# Patient Record
Sex: Female | Born: 1955 | Race: Black or African American | Hispanic: No | Smoking: Former smoker
Health system: Southern US, Community
[De-identification: ages and names within clinical notes are randomized; demographics above are authoritative.]

## PROBLEM LIST (undated history)

## (undated) DIAGNOSIS — I10 Essential (primary) hypertension: Secondary | ICD-10-CM

## (undated) DIAGNOSIS — I607 Nontraumatic subarachnoid hemorrhage from unspecified intracranial artery: Secondary | ICD-10-CM

## (undated) HISTORY — DX: Nontraumatic subarachnoid hemorrhage from unspecified intracranial artery: I60.7

## (undated) HISTORY — PX: HERNIA REPAIR: SHX51

## (undated) HISTORY — PX: BRAIN SURGERY: SHX531

---

## 1997-02-14 DIAGNOSIS — I607 Nontraumatic subarachnoid hemorrhage from unspecified intracranial artery: Secondary | ICD-10-CM

## 1997-02-14 HISTORY — DX: Nontraumatic subarachnoid hemorrhage from unspecified intracranial artery: I60.7

## 1997-12-25 ENCOUNTER — Encounter: Payer: Self-pay | Admitting: Family Medicine

## 1997-12-25 ENCOUNTER — Ambulatory Visit (HOSPITAL_COMMUNITY): Admission: RE | Admit: 1997-12-25 | Discharge: 1997-12-25 | Payer: Self-pay | Admitting: Family Medicine

## 1998-11-20 ENCOUNTER — Emergency Department (HOSPITAL_COMMUNITY): Admission: EM | Admit: 1998-11-20 | Discharge: 1998-11-20 | Payer: Self-pay | Admitting: Emergency Medicine

## 1998-11-20 ENCOUNTER — Encounter: Payer: Self-pay | Admitting: Emergency Medicine

## 2000-01-31 ENCOUNTER — Emergency Department (HOSPITAL_COMMUNITY): Admission: EM | Admit: 2000-01-31 | Discharge: 2000-01-31 | Payer: Self-pay | Admitting: *Deleted

## 2000-01-31 ENCOUNTER — Encounter: Payer: Self-pay | Admitting: *Deleted

## 2001-02-13 ENCOUNTER — Emergency Department (HOSPITAL_COMMUNITY): Admission: EM | Admit: 2001-02-13 | Discharge: 2001-02-13 | Payer: Self-pay | Admitting: Emergency Medicine

## 2001-03-02 ENCOUNTER — Encounter: Admission: RE | Admit: 2001-03-02 | Discharge: 2001-03-02 | Payer: Self-pay | Admitting: Family Medicine

## 2001-11-08 ENCOUNTER — Encounter: Admission: RE | Admit: 2001-11-08 | Discharge: 2001-11-08 | Payer: Self-pay | Admitting: Family Medicine

## 2001-11-08 ENCOUNTER — Encounter (INDEPENDENT_AMBULATORY_CARE_PROVIDER_SITE_OTHER): Payer: Self-pay | Admitting: *Deleted

## 2002-05-22 ENCOUNTER — Encounter: Admission: RE | Admit: 2002-05-22 | Discharge: 2002-05-22 | Payer: Self-pay | Admitting: Family Medicine

## 2002-11-14 ENCOUNTER — Encounter: Admission: RE | Admit: 2002-11-14 | Discharge: 2002-11-14 | Payer: Self-pay | Admitting: Sports Medicine

## 2003-02-15 ENCOUNTER — Encounter (INDEPENDENT_AMBULATORY_CARE_PROVIDER_SITE_OTHER): Payer: Self-pay | Admitting: *Deleted

## 2003-02-15 LAB — CONVERTED CEMR LAB

## 2003-02-20 ENCOUNTER — Encounter: Admission: RE | Admit: 2003-02-20 | Discharge: 2003-02-20 | Payer: Self-pay | Admitting: Family Medicine

## 2003-04-08 ENCOUNTER — Encounter: Admission: RE | Admit: 2003-04-08 | Discharge: 2003-04-08 | Payer: Self-pay | Admitting: Sports Medicine

## 2003-07-24 ENCOUNTER — Emergency Department (HOSPITAL_COMMUNITY): Admission: EM | Admit: 2003-07-24 | Discharge: 2003-07-25 | Payer: Self-pay | Admitting: Emergency Medicine

## 2003-08-15 ENCOUNTER — Emergency Department (HOSPITAL_COMMUNITY): Admission: EM | Admit: 2003-08-15 | Discharge: 2003-08-16 | Payer: Self-pay | Admitting: Emergency Medicine

## 2004-02-23 ENCOUNTER — Emergency Department (HOSPITAL_COMMUNITY): Admission: EM | Admit: 2004-02-23 | Discharge: 2004-02-23 | Payer: Self-pay | Admitting: Family Medicine

## 2004-02-27 ENCOUNTER — Emergency Department (HOSPITAL_COMMUNITY): Admission: EM | Admit: 2004-02-27 | Discharge: 2004-02-27 | Payer: Self-pay | Admitting: Family Medicine

## 2004-03-10 ENCOUNTER — Ambulatory Visit: Payer: Self-pay | Admitting: Family Medicine

## 2004-03-26 ENCOUNTER — Encounter: Admission: RE | Admit: 2004-03-26 | Discharge: 2004-03-26 | Payer: Self-pay | Admitting: Sports Medicine

## 2004-12-08 ENCOUNTER — Ambulatory Visit: Payer: Self-pay | Admitting: Sports Medicine

## 2004-12-13 ENCOUNTER — Ambulatory Visit: Payer: Self-pay | Admitting: Sports Medicine

## 2005-05-31 ENCOUNTER — Ambulatory Visit: Payer: Self-pay | Admitting: Family Medicine

## 2005-08-23 ENCOUNTER — Encounter: Admission: RE | Admit: 2005-08-23 | Discharge: 2005-08-23 | Payer: Self-pay | Admitting: Family Medicine

## 2005-09-09 ENCOUNTER — Ambulatory Visit: Payer: Self-pay | Admitting: Family Medicine

## 2005-09-09 ENCOUNTER — Ambulatory Visit (HOSPITAL_COMMUNITY): Admission: RE | Admit: 2005-09-09 | Discharge: 2005-09-09 | Payer: Self-pay | Admitting: Family Medicine

## 2006-04-13 DIAGNOSIS — N951 Menopausal and female climacteric states: Secondary | ICD-10-CM

## 2006-04-13 DIAGNOSIS — I10 Essential (primary) hypertension: Secondary | ICD-10-CM | POA: Insufficient documentation

## 2006-04-13 DIAGNOSIS — E669 Obesity, unspecified: Secondary | ICD-10-CM

## 2006-04-14 ENCOUNTER — Encounter (INDEPENDENT_AMBULATORY_CARE_PROVIDER_SITE_OTHER): Payer: Self-pay | Admitting: *Deleted

## 2006-10-11 ENCOUNTER — Ambulatory Visit: Payer: Self-pay | Admitting: Family Medicine

## 2006-10-11 DIAGNOSIS — I609 Nontraumatic subarachnoid hemorrhage, unspecified: Secondary | ICD-10-CM

## 2006-11-14 ENCOUNTER — Ambulatory Visit: Payer: Self-pay | Admitting: Family Medicine

## 2006-11-14 ENCOUNTER — Encounter (INDEPENDENT_AMBULATORY_CARE_PROVIDER_SITE_OTHER): Payer: Self-pay | Admitting: Family Medicine

## 2006-11-14 LAB — CONVERTED CEMR LAB
Albumin: 4.4 g/dL (ref 3.5–5.2)
BUN: 14 mg/dL (ref 6–23)
Calcium: 9.4 mg/dL (ref 8.4–10.5)
Chlamydia, DNA Probe: NEGATIVE
Chloride: 104 meq/L (ref 96–112)
Creatinine, Ser: 1.13 mg/dL (ref 0.40–1.20)
Glucose, Urine, Semiquant: NEGATIVE
HCT: 42.2 % (ref 36.0–46.0)
MCHC: 32 g/dL (ref 30.0–36.0)
Nitrite: NEGATIVE
Potassium: 3.7 meq/L (ref 3.5–5.3)
Protein, U semiquant: NEGATIVE
RDW: 13.8 % (ref 11.5–14.0)
Sodium: 140 meq/L (ref 135–145)
Specific Gravity, Urine: 1.015
Total Bilirubin: 0.5 mg/dL (ref 0.3–1.2)
Urobilinogen, UA: 0.2
VLDL: 19 mg/dL (ref 0–40)

## 2006-11-16 ENCOUNTER — Telehealth (INDEPENDENT_AMBULATORY_CARE_PROVIDER_SITE_OTHER): Payer: Self-pay | Admitting: Family Medicine

## 2006-11-16 ENCOUNTER — Encounter (INDEPENDENT_AMBULATORY_CARE_PROVIDER_SITE_OTHER): Payer: Self-pay | Admitting: Family Medicine

## 2006-11-20 ENCOUNTER — Ambulatory Visit (HOSPITAL_COMMUNITY): Admission: RE | Admit: 2006-11-20 | Discharge: 2006-11-20 | Payer: Self-pay | Admitting: Neurology

## 2006-11-20 ENCOUNTER — Encounter (INDEPENDENT_AMBULATORY_CARE_PROVIDER_SITE_OTHER): Payer: Self-pay | Admitting: Family Medicine

## 2006-12-06 ENCOUNTER — Encounter (INDEPENDENT_AMBULATORY_CARE_PROVIDER_SITE_OTHER): Payer: Self-pay | Admitting: Family Medicine

## 2006-12-08 ENCOUNTER — Encounter (INDEPENDENT_AMBULATORY_CARE_PROVIDER_SITE_OTHER): Payer: Self-pay | Admitting: Family Medicine

## 2006-12-14 ENCOUNTER — Ambulatory Visit: Payer: Self-pay | Admitting: Family Medicine

## 2006-12-14 ENCOUNTER — Encounter: Payer: Self-pay | Admitting: Family Medicine

## 2007-01-10 ENCOUNTER — Encounter: Admission: RE | Admit: 2007-01-10 | Discharge: 2007-01-10 | Payer: Self-pay | Admitting: Sports Medicine

## 2007-01-18 ENCOUNTER — Encounter: Payer: Self-pay | Admitting: *Deleted

## 2007-02-13 ENCOUNTER — Ambulatory Visit: Payer: Self-pay | Admitting: Family Medicine

## 2007-03-22 ENCOUNTER — Ambulatory Visit: Payer: Self-pay | Admitting: Family Medicine

## 2007-06-19 ENCOUNTER — Emergency Department (HOSPITAL_COMMUNITY): Admission: EM | Admit: 2007-06-19 | Discharge: 2007-06-19 | Payer: Self-pay | Admitting: Emergency Medicine

## 2007-06-25 ENCOUNTER — Emergency Department (HOSPITAL_COMMUNITY): Admission: EM | Admit: 2007-06-25 | Discharge: 2007-06-25 | Payer: Self-pay | Admitting: Family Medicine

## 2007-11-30 ENCOUNTER — Telehealth: Payer: Self-pay | Admitting: *Deleted

## 2007-12-28 ENCOUNTER — Encounter: Payer: Self-pay | Admitting: *Deleted

## 2008-01-14 ENCOUNTER — Encounter: Admission: RE | Admit: 2008-01-14 | Discharge: 2008-01-14 | Payer: Self-pay | Admitting: Hospitalist

## 2008-01-14 LAB — CONVERTED CEMR LAB
Pap Smear: NORMAL
Pap Smear: NORMAL

## 2008-01-21 ENCOUNTER — Ambulatory Visit: Payer: Self-pay | Admitting: Family Medicine

## 2008-01-21 ENCOUNTER — Encounter: Payer: Self-pay | Admitting: Family Medicine

## 2008-01-21 LAB — CONVERTED CEMR LAB
Chlamydia, DNA Probe: NEGATIVE
GC Probe Amp, Genital: NEGATIVE
Whiff Test: NEGATIVE

## 2008-01-25 ENCOUNTER — Encounter: Payer: Self-pay | Admitting: Family Medicine

## 2008-02-05 ENCOUNTER — Ambulatory Visit: Payer: Self-pay | Admitting: Gastroenterology

## 2008-02-18 ENCOUNTER — Encounter: Payer: Self-pay | Admitting: Gastroenterology

## 2008-02-18 ENCOUNTER — Ambulatory Visit: Payer: Self-pay | Admitting: Gastroenterology

## 2008-02-20 ENCOUNTER — Encounter: Payer: Self-pay | Admitting: Gastroenterology

## 2008-08-25 ENCOUNTER — Encounter: Payer: Self-pay | Admitting: Family Medicine

## 2009-01-14 ENCOUNTER — Encounter: Admission: RE | Admit: 2009-01-14 | Discharge: 2009-01-14 | Payer: Self-pay | Admitting: Family Medicine

## 2009-04-27 ENCOUNTER — Telehealth: Payer: Self-pay | Admitting: Family Medicine

## 2009-05-13 ENCOUNTER — Ambulatory Visit: Payer: Self-pay | Admitting: Family Medicine

## 2009-05-13 ENCOUNTER — Encounter: Payer: Self-pay | Admitting: Family Medicine

## 2009-05-13 LAB — CONVERTED CEMR LAB: Pap Smear: NEGATIVE

## 2009-05-26 LAB — CONVERTED CEMR LAB
CO2: 23 meq/L (ref 19–32)
Cholesterol: 177 mg/dL (ref 0–200)
Creatinine, Ser: 0.93 mg/dL (ref 0.40–1.20)
HDL: 51 mg/dL (ref >39)
LDL Cholesterol: 109 mg/dL — ABNORMAL HIGH (ref 0–99)
MCHC: 31.9 g/dL (ref 30.0–36.0)
MCV: 86.9 fL (ref 78.0–100.0)
RBC: 4.58 M/uL (ref 3.87–5.11)
RDW: 13.8 % (ref 11.5–15.5)
Sodium: 140 meq/L (ref 135–145)
Triglycerides: 85 mg/dL (ref <150)
VLDL: 17 mg/dL (ref 0–40)
WBC: 4.4 10*3/uL (ref 4.0–10.5)

## 2010-01-15 ENCOUNTER — Encounter: Admission: RE | Admit: 2010-01-15 | Discharge: 2010-01-15 | Payer: Self-pay | Admitting: Family Medicine

## 2010-02-11 ENCOUNTER — Ambulatory Visit: Payer: Self-pay

## 2010-03-16 NOTE — Assessment & Plan Note (Signed)
Summary: cpe,tcb   Vital Signs:  Patient profile:   55 year old female Height:      65 inches Weight:      207 pounds BMI:     34.57 Temp:     98.1 degrees F oral Pulse rate:   67 / minute BP sitting:   136 / 92  (right arm) Cuff size:   regular  Vitals Entered By: Tessie Fass CMA (May 13, 2009 9:41 AM) CC: complete physical Is Patient Diabetic? No Pain Assessment Patient in pain? no        Primary Care Provider:  Milinda Antis MD  CC:  complete physical.  History of Present Illness: No concerns HTN- tolerating medication, no side effects, no chest pain, SOB, no headache, occassional leg swelling after a long day.  Needs PAP smear today, wants STD check Had colonscopy, 1 poly removed Had Mammogram- neg for malignancy NO menstruation  > 10 years    Habits & Providers  Alcohol-Tobacco-Diet     Tobacco Status: quit  Current Medications (verified): 1)  Caltrate 600+d Plus 600-400 Mg-Unit Tabs (Calcium Carbonate-Vit D-Min) .... Take 1 Tablet By Mouth Twice A Day 2)  Cardizem Cd 240 Mg Cp24 (Diltiazem Hcl Coated Beads) .... Take 1 Capsule By Mouth Once A Day 3)  Maxzide 75-50 Mg Tabs (Triamterene-Hctz) .... Take 1 Tablet By Mouth Once A Day 4)  Epipen 0.3 Mg/0.46ml (1:1000) Devi (Epinephrine Hcl (Anaphylaxis)) .... As Needed Anaphylaxis  Allergies (verified): 1)  ! * Shellfish 2)  ! * Nuts 3)  ! * Seafood 4)  ! * Cats  Social History: Reviewed history from 04/13/2006 and no changes required.  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  Review of Systems       Per HPI  Physical Exam  General:  Well-developed,well-nourished,in no acute distress; alert,appropriate and cooperative throughout examination Vital signs noted  Eyes:  EOMI. Perrla. Funduscopic exam benign, without hemorrhages, exudates or papilledema. Vision grossly normal. Mouth:   Oral mucosa and oropharynx without lesions or exudates.   Neck:  , supple Breasts:  No mass, nodules, thickening, tenderness, bulging, retraction, inflamation, nipple discharge or skin changes noted.   Lungs:  Normal respiratory effort, chest expands symmetrically. Lungs are clear to auscultation, no crackles or wheezes. Heart:  Normal rate and regular rhythm. S1 and S2 normal without gallop, murmur, click, rub or other extra sounds. Abdomen:  Bowel sounds positive,abdomen soft and non-tender without masses,  Genitalia:  Normal introitus for age, no external lesions, no vaginal discharge, mucosa pink and moist, no vaginal or cervical lesions, no vaginal atrophy, no friaility or hemorrhage, normal uterus size and position, no adnexal masses or tenderness Extremities:  No edema  pulses 2+ Foot exam- normal inspection, normal sensation hyperpigmentation on soles of feet bilaterally   Impression & Recommendations:  Problem # 1:  HEALTH MAINTENANCE EXAM (ICD-V70.0) PAP smear done, as well as STD check Orders: [facility] - Est  40-64 yrs ([address])  Problem # 2:  HYPERTENSION, BENIGN SYSTEMIC (ICD-401.1) Assessment: Unchanged Continue meds, labs ordered for baseline monitoring Her updated medication list for this problem includes:    Cardizem Cd 240 Mg Cp24 (Diltiazem hcl coated beads) .Marland Kitchen... Take 1 capsule by mouth once a day    Maxzide 75-50 Mg Tabs (Triamterene-hctz) .Marland Kitchen... Take 1 tablet by mouth once a day  Orders: Basic  Met-[facility] [phone]) CBC-[facility] [phone]) Lipid-[facility] [phone]) [facility] - Est  40-64 yrs ([address])  Complete Medication List: 1)  Caltrate 600+d Plus 600-400 Mg-unit Tabs (Calcium carbonate-vit d-min) .... Take 1 tablet by mouth twice a day 2)  Cardizem Cd 240 Mg Cp24 (Diltiazem hcl coated beads) .... Take 1 capsule by mouth once a day 3)  Maxzide 75-50 Mg Tabs (Triamterene-hctz) .... Take 1 tablet by mouth once a day 4)  Epipen 0.3 Mg/0.75ml (1:1000) Devi (Epinephrine hcl  (anaphylaxis)) .... As needed anaphylaxis  Other Orders: Pap Smear-[facility] ([id number]) GC/Chlamydia-[facility] ([id number])  Patient Instructions: 1)  For your blood pressure-continue your walking routine, continue your current medications 2)  I will follow-up with your PAP smear results and other labs today. If everything is normal I will send you a letter via mail. 3)  Follow-up in 6 months Prescriptions: MAXZIDE 75-50 MG TABS (TRIAMTERENE-HCTZ) Take 1 tablet by mouth once a day  #30 x 6   Entered and Authorized by:   Milinda Antis MD   Signed by:   Milinda Antis MD on 05/13/2009   Method used:   Electronically to        [facility] [phone]* (retail)       [address]       [place], [place]  [address]       Ph: [phone]       Fax: [phone]   RxID:   [id number] CALTRATE 600+D PLUS 600-400 MG-UNIT TABS (CALCIUM CARBONATE-VIT D-MIN) Take 1 tablet by mouth twice a day  #60 x 6   Entered and Authorized by:   Milinda Antis MD   Signed by:   Milinda Antis MD on 05/13/2009   Method used:   Electronically to        [facility] [phone]* (retail)       [address]       [place], [place]  [address]       Ph: [phone]       Fax: [phone]   RxID:   [id number] CARDIZEM CD 240 MG CP24 (DILTIAZEM HCL COATED BEADS) Take 1 capsule by mouth once a day  #30 x 6   Entered and Authorized by:   Milinda Antis MD   Signed by:   Milinda Antis MD on 05/13/2009   Method used:   Electronically to        [facility] [phone]* (retail)       [address]       [place], [place]  [address]       Ph: [phone]       Fax: [phone]   RxID:   [id number]

## 2010-03-16 NOTE — Progress Notes (Signed)
Summary: Rx Req  Phone Note Refill Request Call back at Home Phone [phone] Message from:  Patient  Refills Requested: Medication #1:  MAXZIDE 75-50 MG TABS Take 1 tablet by mouth once a day  Medication #2:  CARDIZEM CD 240 MG CP24 Take 1 capsule by mouth once a day PT USES [facility] [address]  Initial call taken by: Clydell Hakim,  April 27, 2009 3:28 PM  Follow-up for Phone Call        Please tell pt she has to come in for a visit. Has not been seen since 2009. Will give a 59month supply only Follow-up by: Milinda Antis MD,  April 27, 2009 3:38 PM    Prescriptions: MAXZIDE 75-50 MG TABS (TRIAMTERENE-HCTZ) Take 1 tablet by mouth once a day  #30 x 0   Entered and Authorized by:   Milinda Antis MD   Signed by:   Milinda Antis MD on 04/27/2009   Method used:   Electronically to        [facility] [phone]* (retail)       [address]       [place], [place]  [address]       Ph: [phone]       Fax: [phone]   RxID:   [id number] CARDIZEM CD 240 MG CP24 (DILTIAZEM HCL COATED BEADS) Take 1 capsule by mouth once a day  #30 x 0   Entered and Authorized by:   Milinda Antis MD   Signed by:   Milinda Antis MD on 04/27/2009   Method used:   Electronically to        [facility] [phone]* (retail)       [address]       [place], [place]  [address]       Ph: [phone]       Fax: [phone]   RxID:   [id number]   Appended Document: Rx Req called pt. pt has appt 05-13-09 with dr.[place] and will keep it.

## 2010-03-18 ENCOUNTER — Encounter: Payer: Self-pay | Admitting: *Deleted

## 2010-06-02 ENCOUNTER — Other Ambulatory Visit: Payer: Self-pay | Admitting: Family Medicine

## 2010-06-03 NOTE — Telephone Encounter (Signed)
Refill request

## 2010-06-03 NOTE — Telephone Encounter (Signed)
Pt needs an appt , not seen in > 1 year

## 2010-06-17 ENCOUNTER — Other Ambulatory Visit: Payer: Self-pay | Admitting: Family Medicine

## 2010-06-17 NOTE — Telephone Encounter (Signed)
Denied refill request for Maxide pt needs an appt first

## 2010-07-23 ENCOUNTER — Other Ambulatory Visit: Payer: Self-pay | Admitting: Family Medicine

## 2010-07-23 MED ORDER — DILTIAZEM HCL ER COATED BEADS 240 MG PO CP24: 240.0000 mg | ORAL_CAPSULE | Freq: Every day | ORAL | Status: DC

## 2010-07-23 NOTE — Telephone Encounter (Signed)
Needs an appt before further refills 

## 2010-08-29 ENCOUNTER — Other Ambulatory Visit: Payer: Self-pay | Admitting: Family Medicine

## 2010-08-29 MED ORDER — TRIAMTERENE-HCTZ 75-50 MG PO TABS: 1.0000 | ORAL_TABLET | Freq: Every day | ORAL | Status: DC

## 2010-08-29 MED ORDER — DILTIAZEM HCL ER COATED BEADS 240 MG PO CP24: 240.0000 mg | ORAL_CAPSULE | Freq: Every day | ORAL | Status: DC

## 2010-09-06 ENCOUNTER — Other Ambulatory Visit: Payer: Self-pay | Admitting: Family Medicine

## 2010-09-06 MED ORDER — DILTIAZEM HCL ER COATED BEADS 240 MG PO CP24: 240.0000 mg | ORAL_CAPSULE | Freq: Every day | ORAL | Status: DC

## 2010-11-25 LAB — CBC
Platelets: 315
RDW: 13.3
WBC: 3.1 — ABNORMAL LOW

## 2010-11-25 LAB — BASIC METABOLIC PANEL
GFR calc Af Amer: >60
GFR calc non Af Amer: 50 — ABNORMAL LOW
Sodium: 141

## 2010-11-25 LAB — APTT: aPTT: 29

## 2010-11-25 LAB — PROTIME-INR
INR: 1
Prothrombin Time: 13.8

## 2010-11-29 ENCOUNTER — Other Ambulatory Visit: Payer: Self-pay | Admitting: Family Medicine

## 2010-11-29 MED ORDER — TRIAMTERENE-HCTZ 75-50 MG PO TABS: 1.0000 | ORAL_TABLET | Freq: Every day | ORAL | Status: DC

## 2010-11-29 MED ORDER — DILTIAZEM HCL ER COATED BEADS 240 MG PO CP24: 240.0000 mg | ORAL_CAPSULE | Freq: Every day | ORAL | Status: DC

## 2010-12-21 ENCOUNTER — Other Ambulatory Visit: Payer: Self-pay | Admitting: Family Medicine

## 2010-12-21 DIAGNOSIS — Z1231 Encounter for screening mammogram for malignant neoplasm of breast: Secondary | ICD-10-CM

## 2010-12-31 ENCOUNTER — Encounter: Payer: Self-pay | Admitting: Family Medicine

## 2010-12-31 ENCOUNTER — Ambulatory Visit (INDEPENDENT_AMBULATORY_CARE_PROVIDER_SITE_OTHER): Payer: BC Managed Care – PPO | Admitting: Family Medicine

## 2010-12-31 VITALS — BP 140/98 | HR 70 | Temp 98.2°F | Ht 65.0 in | Wt 205.0 lb

## 2010-12-31 DIAGNOSIS — I609 Nontraumatic subarachnoid hemorrhage, unspecified: Secondary | ICD-10-CM

## 2010-12-31 DIAGNOSIS — I10 Essential (primary) hypertension: Secondary | ICD-10-CM

## 2010-12-31 DIAGNOSIS — Z23 Encounter for immunization: Secondary | ICD-10-CM

## 2010-12-31 LAB — COMPREHENSIVE METABOLIC PANEL
ALT: 23 U/L (ref 0–35)
AST: 19 U/L (ref 0–37)
Alkaline Phosphatase: 83 U/L (ref 39–117)
CO2: 26 mEq/L (ref 19–32)
Creat: 1.13 mg/dL — ABNORMAL HIGH (ref 0.50–1.10)
Total Bilirubin: 0.3 mg/dL (ref 0.3–1.2)

## 2010-12-31 LAB — LIPID PANEL
Cholesterol: 164 mg/dL (ref 0–200)
LDL Cholesterol: 99 mg/dL (ref 0–99)
Total CHOL/HDL Ratio: 3.3 Ratio
VLDL: 15 mg/dL (ref 0–40)

## 2010-12-31 NOTE — Progress Notes (Signed)
  Subjective:    Patient ID: Shelley Allison, female    DOB: 17-Feb-1955, 55 y.o.   MRN: [id number]  HPI Pt is here for new MD visit and HTN f/u  HTN: Pt does not check BPs on a regular a basis. Pt has been compliant with medications which include maxzide and cardizem. No HA, CP, SOB. No LE swelling or LE cramping. Pt does report high sodium intake. Pt was placed on diltiazem s/p ruptured cerebral aneurysm back in 1999. Pt has been relatively stable since episode.  Review of Systems See HPI    Objective:   Physical Exam Gen: up in chair, NAD HEENT: NCAT, EOMI, TMs clear bilaterally CV: RRR, no murmurs auscultated PULM: CTAB, no wheezes, rales, rhoncii ABD: S/NT/+ bowel sounds  EXT: 2+ peripheral pulses   Assessment & Plan:

## 2010-12-31 NOTE — Patient Instructions (Signed)

## 2011-01-01 ENCOUNTER — Encounter: Payer: Self-pay | Admitting: Family Medicine

## 2011-01-01 NOTE — Assessment & Plan Note (Signed)
Clinically asymptomatic. Will continue with current regimen.

## 2011-01-01 NOTE — Assessment & Plan Note (Addendum)
Otherwise stable. Will continue with current regimen. Will check Cr and K today. Will also check lipid panel. Handout on low salt diet given.

## 2011-01-02 ENCOUNTER — Encounter: Payer: Self-pay | Admitting: Family Medicine

## 2011-01-17 ENCOUNTER — Ambulatory Visit
Admission: RE | Admit: 2011-01-17 | Discharge: 2011-01-17 | Disposition: A | Discharge status: Home / Self Care | Payer: BC Managed Care – PPO | Source: Ambulatory Visit | Attending: Family Medicine | Admitting: Family Medicine

## 2011-01-17 ENCOUNTER — Other Ambulatory Visit: Payer: Self-pay | Admitting: Family Medicine

## 2011-01-17 DIAGNOSIS — Z1231 Encounter for screening mammogram for malignant neoplasm of breast: Secondary | ICD-10-CM

## 2011-01-17 MED ORDER — DILTIAZEM HCL ER COATED BEADS 240 MG PO CP24: 240.0000 mg | ORAL_CAPSULE | Freq: Every day | ORAL | Status: DC

## 2011-01-17 NOTE — Telephone Encounter (Signed)
Refill request

## 2011-01-24 ENCOUNTER — Ambulatory Visit (INDEPENDENT_AMBULATORY_CARE_PROVIDER_SITE_OTHER): Payer: BC Managed Care – PPO | Admitting: Family Medicine

## 2011-01-24 ENCOUNTER — Encounter: Payer: Self-pay | Admitting: Family Medicine

## 2011-01-24 DIAGNOSIS — J111 Influenza due to unidentified influenza virus with other respiratory manifestations: Secondary | ICD-10-CM

## 2011-01-24 NOTE — Progress Notes (Signed)
  Subjective:    Patient ID: Shelley Allison, female    DOB: 05/05/1955, 55 y.o.   MRN: [id number]  HPI flulike symptoms: Patient reports fever-subjective, dizziness, body aches, chills, positive nausea, vomiting x5-6 times over weekend, occasional dizziness, weakness, off and on headache, decreased appetite-since Saturday morning. No known sick contacts. No abdominal pain. No dark stools. No bloody stools. Patient works in Futures trader.   Review of Systems As per above.    Objective:   Physical Exam  Constitutional: She is oriented to person, place, and time. She appears well-developed and well-nourished.  HENT:  Head: Normocephalic and atraumatic.       + mild throat erythema  Eyes: Pupils are equal, round, and reactive to light.  Neck: Normal range of motion. Neck supple.  Cardiovascular: Normal rate, regular rhythm and normal heart sounds.   No murmur heard. Pulmonary/Chest: Effort normal and breath sounds normal. No respiratory distress.  Abdominal: Soft. She exhibits no distension. There is no tenderness.  Musculoskeletal: She exhibits no edema.  Neurological: She is alert and oriented to person, place, and time.  Skin: No rash noted.  Psychiatric: She has a normal mood and affect. Her behavior is normal.          Assessment & Plan:  and

## 2011-02-02 DIAGNOSIS — J111 Influenza due to unidentified influenza virus with other respiratory manifestations: Secondary | ICD-10-CM | POA: Insufficient documentation

## 2011-02-02 NOTE — Assessment & Plan Note (Signed)
Symptoms most consistent with the flu. Symptomatic treatment only at this time. Patient to only return to work after afebrile for greater than 24 hours.

## 2011-04-23 ENCOUNTER — Other Ambulatory Visit: Payer: Self-pay | Admitting: Family Medicine

## 2011-04-24 NOTE — Telephone Encounter (Signed)
Refill request

## 2011-06-14 ENCOUNTER — Other Ambulatory Visit: Payer: Self-pay | Admitting: Family Medicine

## 2011-07-12 ENCOUNTER — Encounter (HOSPITAL_COMMUNITY): Payer: Self-pay

## 2011-07-12 ENCOUNTER — Emergency Department (HOSPITAL_COMMUNITY): Payer: No Typology Code available for payment source

## 2011-07-12 ENCOUNTER — Emergency Department (HOSPITAL_COMMUNITY)
Admission: EM | Admit: 2011-07-12 | Discharge: 2011-07-13 | Disposition: A | Discharge status: Home / Self Care | Payer: No Typology Code available for payment source | Attending: Emergency Medicine | Admitting: Emergency Medicine

## 2011-07-12 DIAGNOSIS — M25579 Pain in unspecified ankle and joints of unspecified foot: Secondary | ICD-10-CM | POA: Insufficient documentation

## 2011-07-12 DIAGNOSIS — M79672 Pain in left foot: Secondary | ICD-10-CM

## 2011-07-12 DIAGNOSIS — M8569 Other cyst of bone, multiple sites: Secondary | ICD-10-CM | POA: Insufficient documentation

## 2011-07-12 DIAGNOSIS — M8560 Other cyst of bone, unspecified site: Secondary | ICD-10-CM

## 2011-07-12 HISTORY — DX: Essential (primary) hypertension: I10

## 2011-07-12 MED ORDER — TRAMADOL HCL 50 MG PO TABS: 50.0000 mg | ORAL_TABLET | Freq: Four times a day (QID) | ORAL | Status: AC | PRN
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
## 2011-07-13 NOTE — ED Provider Notes (Signed)
Medical screening examination/treatment/procedure(s) were performed by non-physician practitioner and as supervising physician I was immediately available for consultation/collaboration.   Yeray Tomas R Kimberlie Csaszar, MD 07/13/11 0852 
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
## 2011-10-26 ENCOUNTER — Other Ambulatory Visit: Payer: Self-pay | Admitting: Family Medicine

## 2011-11-01 ENCOUNTER — Telehealth: Payer: Self-pay | Admitting: *Deleted

## 2011-11-01 NOTE — Telephone Encounter (Signed)
Patient walks in office  now stating she needs refill on BP meds. According to our records   and the bottle she last had refilled she has not been taking medication regularly.  Last Maxizide given April 2013 and last refill on diltiazem  08/13/2011  Advised she will need appointment for refill.  Appointment scheduled tomorrow at 2:30. Consulted with Dr. Denyse Amass also.

## 2011-11-02 ENCOUNTER — Ambulatory Visit (INDEPENDENT_AMBULATORY_CARE_PROVIDER_SITE_OTHER): Payer: BC Managed Care – PPO | Admitting: Family Medicine

## 2011-11-02 ENCOUNTER — Encounter: Payer: Self-pay | Admitting: Family Medicine

## 2011-11-02 VITALS — BP 171/85 | HR 67 | Temp 98.2°F | Ht 65.0 in | Wt 208.9 lb

## 2011-11-02 DIAGNOSIS — Z23 Encounter for immunization: Secondary | ICD-10-CM

## 2011-11-02 DIAGNOSIS — I10 Essential (primary) hypertension: Secondary | ICD-10-CM

## 2011-11-02 DIAGNOSIS — I609 Nontraumatic subarachnoid hemorrhage, unspecified: Secondary | ICD-10-CM

## 2011-11-02 LAB — BASIC METABOLIC PANEL
BUN: 17 mg/dL (ref 6–23)
Potassium: 4.2 mEq/L (ref 3.5–5.3)

## 2011-11-02 MED ORDER — TRIAMTERENE-HCTZ 75-50 MG PO TABS: 1.0000 | ORAL_TABLET | Freq: Every day | ORAL | Status: DC

## 2011-11-02 MED ORDER — DILTIAZEM HCL ER COATED BEADS 240 MG PO CP24: 240.0000 mg | ORAL_CAPSULE | Freq: Every day | ORAL | Status: DC

## 2011-11-02 NOTE — Patient Instructions (Addendum)
You are getting a flu shot (need every year) and a tetanus shot (need every 10 years) today Pick up your blood pressure meds Get your blood pressure checked when taking meds regularly.  Goal is 140/90 or less See [place] the beginning of December for cholesterol check - sooner if your blood pressure is running high.   The biggest thing you could do to improve your health is to lose weight.

## 2011-11-03 NOTE — Assessment & Plan Note (Signed)
Uncertain control.  States well controled when taking meds.

## 2011-11-03 NOTE — Progress Notes (Signed)
  Subjective:    Patient ID: Shelley Allison, female    DOB: 02-Oct-1955, 56 y.o.   MRN: [id number]  HPI  Out of meds for three days.  No complaints.  Up to date on health maint.  Will get flu shot today.      Review of Systems     Objective:   Physical Exam  Lungs clear Cardiac RRR without m or g Abd benign.        Assessment & Plan:

## 2012-01-18 ENCOUNTER — Other Ambulatory Visit: Payer: Self-pay | Admitting: Family Medicine

## 2012-01-18 DIAGNOSIS — Z1231 Encounter for screening mammogram for malignant neoplasm of breast: Secondary | ICD-10-CM

## 2012-02-21 ENCOUNTER — Ambulatory Visit
Admission: RE | Admit: 2012-02-21 | Discharge: 2012-02-21 | Disposition: A | Discharge status: Home / Self Care | Payer: BC Managed Care – PPO | Source: Ambulatory Visit | Attending: Family Medicine | Admitting: Family Medicine

## 2012-02-21 DIAGNOSIS — Z1231 Encounter for screening mammogram for malignant neoplasm of breast: Secondary | ICD-10-CM

## 2012-08-04 ENCOUNTER — Emergency Department (HOSPITAL_COMMUNITY)
Admission: EM | Admit: 2012-08-04 | Discharge: 2012-08-04 | Disposition: A | Discharge status: Home / Self Care | Payer: BC Managed Care – PPO | Attending: Emergency Medicine | Admitting: Emergency Medicine

## 2012-08-04 DIAGNOSIS — L02419 Cutaneous abscess of limb, unspecified: Secondary | ICD-10-CM | POA: Insufficient documentation

## 2012-08-04 DIAGNOSIS — L0390 Cellulitis, unspecified: Secondary | ICD-10-CM

## 2012-08-04 DIAGNOSIS — L03119 Cellulitis of unspecified part of limb: Secondary | ICD-10-CM | POA: Insufficient documentation

## 2012-08-04 DIAGNOSIS — Z87891 Personal history of nicotine dependence: Secondary | ICD-10-CM | POA: Insufficient documentation

## 2012-08-04 DIAGNOSIS — Z79899 Other long term (current) drug therapy: Secondary | ICD-10-CM | POA: Insufficient documentation

## 2012-08-04 DIAGNOSIS — Z8669 Personal history of other diseases of the nervous system and sense organs: Secondary | ICD-10-CM | POA: Insufficient documentation

## 2012-08-04 DIAGNOSIS — I10 Essential (primary) hypertension: Secondary | ICD-10-CM | POA: Insufficient documentation

## 2012-08-04 MED ORDER — SULFAMETHOXAZOLE-TRIMETHOPRIM 800-160 MG PO TABS: 1.0000 | ORAL_TABLET | Freq: Two times a day (BID) | ORAL | Status: DC

## 2012-08-04 NOTE — ED Notes (Signed)
Pt c/o red, warm swollen area to LLE near ankle. Pt states area started as a knot on Tues and is getting bigger. Pt ambulatory to exam room with steady gait. Pt also has  swelling to her L ankle which also started at the same time.

## 2012-08-04 NOTE — ED Provider Notes (Signed)
History    This chart was scribed for Glade Nurse, non-physician practitioner working with Raeford Razor, MD by Leone Payor, ED Scribe. This patient was seen in room WTR6/WTR6 and the patient's care was started at 2142.   CSN: [id number]  Arrival date & time 08/04/12  2142   First MD Initiated Contact with Patient 08/04/12 2225      Chief Complaint  Patient presents with  . Leg Pain/Swelling      The history is provided by the patient. No language interpreter was used.    HPI Comments: Shelley Allison is a 57 y.o. female with PMHx of HTN who presents to the Emergency Department complaining of gradual onset pain and swelling anterior LLE proximal to the ankle. Pain is mild. Correction to nurse's note: no warmth. Localized.  Onset 4 days ago. States it started as a half-dollar sized knot on 07/31/12 but has gotten bigger since then. She denies any trauma, significant pain, fever, nausea, vomiting. Rates the pain is 3-4/10 without touch and 5-6/10 when being touched.     Past Medical History  Diagnosis Date  . Cerebral aneurysm rupture 1999  . Hypertension     Past Surgical History  Procedure Laterality Date  . Brain surgery    . Cesarean section    . Hernia repair      No family history on file.  History  Substance Use Topics  . Smoking status: Former Games developer  . Smokeless tobacco: Not on file  . Alcohol Use: No    OB History   Grav Para Term Preterm Abortions TAB SAB Ect Mult Living                  Review of Systems  Constitutional: Negative for fever and diaphoresis.  HENT: Negative for neck pain and neck stiffness.   Eyes: Negative for visual disturbance.  Respiratory: Negative for apnea, chest tightness and shortness of breath.   Cardiovascular: Negative for chest pain and palpitations.  Gastrointestinal: Negative for nausea, vomiting, diarrhea and constipation.  Genitourinary: Negative for dysuria.  Musculoskeletal: Negative for joint swelling and gait  problem.  Skin: Positive for color change.       Redness and swelling to left shin  Neurological: Negative for dizziness, weakness, light-headedness, numbness and headaches.    Allergies  Review of patient's allergies indicates no known allergies.  Home Medications   Current Outpatient Rx  Name  Route  Sig  Dispense  Refill  . Calcium Carbonate-Vitamin D (CALTRATE 600+D) 600-400 MG-UNIT per tablet   Oral   Take 1 tablet by mouth 2 (two) times daily.           Marland Kitchen diltiazem (CARDIZEM CD) 240 MG 24 hr capsule   Oral   Take 1 capsule (240 mg total) by mouth daily. Take 1 tablet by mouth daily.   30 capsule   12   . EPINEPHrine (EPIPEN) 0.3 mg/0.3 mL DEVI   Intramuscular   Inject 0.3 mg into the muscle as needed. for anaphylaxis          . triamterene-hydrochlorothiazide (MAXZIDE) 75-50 MG per tablet   Oral   Take 1 tablet by mouth daily.   30 tablet   12     Refill Approved     BP 189/89  Pulse 82  Temp(Src) 99 F (37.2 C) (Oral)  Resp 16  SpO2 100%  Physical Exam  Nursing note and vitals reviewed. Constitutional: She is oriented to person, place, and time. She appears  well-developed and well-nourished. No distress.  HENT:  Head: Normocephalic and atraumatic.  Eyes: Conjunctivae and EOM are normal. Pupils are equal, round, and reactive to light.  Neck: Normal range of motion. Neck supple.  No meningeal signs  Cardiovascular: Normal rate, regular rhythm and normal heart sounds.  Exam reveals no gallop and no friction rub.   No murmur heard. Good cap refill.  Pulmonary/Chest: Effort normal and breath sounds normal. No respiratory distress. She has no wheezes. She has no rales. She exhibits no tenderness.  Abdominal: Soft. Bowel sounds are normal. She exhibits no distension. There is no tenderness. There is no rebound and no guarding.  Musculoskeletal: Normal range of motion. She exhibits tenderness. She exhibits no edema.  5/5 strength throughout. Mild swelling  and erythema to lower left shin. No warmth. No calf tenderness. No palpable cord. Good ROM to ankle, toes, knee.   Neurological: She is alert and oriented to person, place, and time. No cranial nerve deficit.  Skin: Skin is warm and dry. No rash noted. She is not diaphoretic. There is erythema.  5cm erythematous. No warm to touch. Not circumferential. Mild swelling, localized. No red streaks.  Psychiatric: She has a normal mood and affect.    ED Course  Procedures (including critical care time)  DIAGNOSTIC STUDIES: Oxygen Saturation is 100% on RA, normal by my interpretation.    COORDINATION OF CARE: 11:02 PM Discussed treatment plan with pt at bedside and pt agreed to plan.   Labs Reviewed - No data to display No results found.   1. Cellulitis       MDM  Suspect uncomplicated cellulitis based on limited area of involvement, minimal pain, no systemic signs of illness (eg, fever, chills, dehydration, altered mental status, tachypnea, tachycardia, hypotension), no risk factors for serious illness (eg, extremes of age, general debility, immunocompromised status).   PE reveals redness, swelling, mildly tender, not warm to touch. Skin intact, No bleeding. No bullae. Non purulent. Non circumferential.  Borders are not elevated or sharply demarcated.   Drew a line around the area of infection. Pt was instructed to return to the ED if area surpasses the boarder or pain intensifies.   Will prescribed Bactrim to cover for MRSA, direct pt to apply warm compresses and to return to ED for I&D if pain should increase or abscess should develop. Will provide dermatologist contact information and resource.   I personally performed the services described in this documentation, which was scribed in my presence. The recorded information has been reviewed and is accurate.     Glade Nurse, PA-C 08/05/12 1505

## 2012-08-11 NOTE — ED Provider Notes (Signed)
Medical screening examination/treatment/procedure(s) were performed by non-physician practitioner and as supervising physician I was immediately available for consultation/collaboration.  Kindel Rochefort, MD 08/11/12 1646 

## 2012-11-13 ENCOUNTER — Other Ambulatory Visit: Payer: Self-pay | Admitting: Family Medicine

## 2012-11-29 ENCOUNTER — Telehealth: Payer: Self-pay | Admitting: Emergency Medicine

## 2012-11-29 MED ORDER — TRIAMTERENE-HCTZ 75-50 MG PO TABS: 1.0000 | ORAL_TABLET | Freq: Every day | ORAL | Status: DC

## 2012-11-29 MED ORDER — DILTIAZEM HCL ER COATED BEADS 240 MG PO CP24: ORAL_CAPSULE | ORAL | Status: DC

## 2012-11-29 NOTE — Telephone Encounter (Signed)
Pt notified.  Shelley Allison, CMA  

## 2012-11-29 NOTE — Telephone Encounter (Signed)
Will fwd to MD for review.  Shelley Allison L, CMA  

## 2012-11-29 NOTE — Telephone Encounter (Signed)
Daughter calling to request refill for mom's bp medication.  Only have 1 weeks worth of med left.  Scheduled an appt for 11/6 @ 4:00 with provider.  Can fax rx refill to [facility] on [address] [facility].

## 2012-11-29 NOTE — Telephone Encounter (Signed)
1 month supply of diltiazem and maxide sent to [facility].  Will give additional refills at appt on 11/6.

## 2012-12-18 ENCOUNTER — Encounter: Payer: Self-pay | Admitting: Emergency Medicine

## 2012-12-18 ENCOUNTER — Ambulatory Visit (INDEPENDENT_AMBULATORY_CARE_PROVIDER_SITE_OTHER): Payer: BC Managed Care – PPO | Admitting: Emergency Medicine

## 2012-12-18 VITALS — BP 143/86 | HR 65 | Temp 97.8°F | Wt 201.0 lb

## 2012-12-18 DIAGNOSIS — I10 Essential (primary) hypertension: Secondary | ICD-10-CM

## 2012-12-18 MED ORDER — DILTIAZEM HCL ER COATED BEADS 240 MG PO CP24: ORAL_CAPSULE | ORAL | Status: DC

## 2012-12-18 MED ORDER — TRIAMTERENE-HCTZ 75-50 MG PO TABS
1.0000 | ORAL_TABLET | Freq: Every day | ORAL | Status: DC
Start: 2012-12-18 — End: 2013-12-19

## 2012-12-18 NOTE — Patient Instructions (Signed)
It was nice to meet you!  Your blood pressure is pretty good. If it is over 150/90 at home, please come back so we can adjust your medicines.  We are checking some blood work today.  I will call you if anything is wrong, otherwise you will get a letter with the results in the mail in 1-2 weeks.  You are due for a pap smear.  Please make an appointment to do this in the next year.  Follow up in 1 year.

## 2012-12-18 NOTE — Assessment & Plan Note (Signed)
Just above goal today. Will continue diltiazem and maxide at current doses. She will check at home periodically.  If over 150/90, will call the office. BMP today. Follow up in 1 year.

## 2012-12-18 NOTE — Progress Notes (Signed)
  Subjective:    Patient ID: Shelley Allison, female    DOB: 27-May-1955, 57 y.o.   MRN: [id number]  HPI Shelley Allison is here for f/u HTN.  Hypertension Compliant with medication: yes; sometimes forgets to take it with her when she goes out of town. Side effects from medication: no Check BP at home: yes  BP ranges: 140s if she takes her medication  Low salt diet: yes Exercise: no - walks a lot at work  Chest pain: no Palpitations: no Vision changes: no Leg edema: no Dizziness: no  I have reviewed and updated the following as appropriate: allergies, current medications, past family history, past medical history, past social history and past surgical history SHx: former smoker  Review of Systems See HPI    Objective:   Physical Exam        Assessment & Plan:

## 2012-12-19 LAB — BASIC METABOLIC PANEL
Chloride: 107 mEq/L (ref 96–112)
Glucose, Bld: 83 mg/dL (ref 70–99)
Potassium: 4 mEq/L (ref 3.5–5.3)
Sodium: 140 mEq/L (ref 135–145)

## 2012-12-20 ENCOUNTER — Encounter: Payer: Self-pay | Admitting: Emergency Medicine

## 2013-09-24 ENCOUNTER — Encounter: Payer: Self-pay | Admitting: Gastroenterology

## 2013-12-19 ENCOUNTER — Other Ambulatory Visit: Payer: Self-pay | Admitting: *Deleted

## 2013-12-19 DIAGNOSIS — I10 Essential (primary) hypertension: Secondary | ICD-10-CM

## 2013-12-19 MED ORDER — TRIAMTERENE-HCTZ 75-50 MG PO TABS: 1.0000 | ORAL_TABLET | Freq: Every day | ORAL | Status: DC

## 2013-12-19 MED ORDER — DILTIAZEM HCL ER COATED BEADS 240 MG PO CP24: ORAL_CAPSULE | ORAL | Status: DC

## 2013-12-19 NOTE — Telephone Encounter (Signed)
Refills of Cardizem and Maxzide sent to pharmacy.  Patient has not been to clinic in 1 year.  She should be seen for f/u HTN before further refills given.  [name], MD, MPH PGY-1,  [facility] 12/19/2013 2:08 PM

## 2014-01-29 ENCOUNTER — Other Ambulatory Visit: Payer: Self-pay | Admitting: Family Medicine

## 2014-01-31 NOTE — Telephone Encounter (Signed)
Sent one refill to pharmacy.  Patient needs to be seen in clinic for follow-up to get more refills.  It has been >6560yr since patient was seen in clinic.  Shirlee LatchAngela Rashanna Christiana, MD, MPH PGY-1,  [facility] 01/31/2014 2:18 PM

## 2014-01-31 NOTE — Telephone Encounter (Signed)
Left message on voicemail asking that patient call office to schedule appt before needing refills.

## 2014-03-17 ENCOUNTER — Other Ambulatory Visit: Payer: Self-pay | Admitting: Family Medicine

## 2014-03-17 NOTE — Telephone Encounter (Signed)
Appears from chart review that patient has not been seen in [facility] since [facility].  Will provide 1 refill of antihypertensive, but patient needs to be seen before any further refills will be given.  [name], MD, MPH PGY-1,  [facility] 03/17/2014 3:46 PM

## 2014-04-06 ENCOUNTER — Emergency Department (HOSPITAL_COMMUNITY): Payer: BC Managed Care – PPO

## 2014-04-06 ENCOUNTER — Inpatient Hospital Stay (HOSPITAL_COMMUNITY)
Admission: EM | Admit: 2014-04-06 | Discharge: 2014-04-08 | DRG: 419 | Disposition: A | Discharge status: Home / Self Care | Payer: BC Managed Care – PPO | Attending: General Surgery | Admitting: General Surgery

## 2014-04-06 ENCOUNTER — Encounter (HOSPITAL_COMMUNITY): Payer: Self-pay | Admitting: *Deleted

## 2014-04-06 DIAGNOSIS — E669 Obesity, unspecified: Secondary | ICD-10-CM | POA: Diagnosis present

## 2014-04-06 DIAGNOSIS — Z6831 Body mass index (BMI) 31.0-31.9, adult: Secondary | ICD-10-CM | POA: Diagnosis not present

## 2014-04-06 DIAGNOSIS — I10 Essential (primary) hypertension: Secondary | ICD-10-CM | POA: Diagnosis present

## 2014-04-06 DIAGNOSIS — K810 Acute cholecystitis: Secondary | ICD-10-CM

## 2014-04-06 DIAGNOSIS — K8000 Calculus of gallbladder with acute cholecystitis without obstruction: Secondary | ICD-10-CM | POA: Diagnosis present

## 2014-04-06 DIAGNOSIS — R1011 Right upper quadrant pain: Secondary | ICD-10-CM | POA: Diagnosis present

## 2014-04-06 DIAGNOSIS — Z87891 Personal history of nicotine dependence: Secondary | ICD-10-CM

## 2014-04-06 LAB — COMPREHENSIVE METABOLIC PANEL
ALT: 19 U/L (ref 0–35)
ANION GAP: 6 (ref 5–15)
AST: 18 U/L (ref 0–37)
Albumin: 3.8 g/dL (ref 3.5–5.2)
Alkaline Phosphatase: 93 U/L (ref 39–117)
BUN: 9 mg/dL (ref 6–23)
CALCIUM: 9.3 mg/dL (ref 8.4–10.5)
CHLORIDE: 105 mmol/L (ref 96–112)
CO2: 26 mmol/L (ref 19–32)
Creatinine, Ser: 0.94 mg/dL (ref 0.50–1.10)
GFR calc non Af Amer: 65 mL/min — ABNORMAL LOW (ref >90)
GFR, EST AFRICAN AMERICAN: 75 mL/min — AB (ref >90)
Glucose, Bld: 119 mg/dL — ABNORMAL HIGH (ref 70–99)
POTASSIUM: 3.6 mmol/L (ref 3.5–5.1)
Sodium: 137 mmol/L (ref 135–145)
TOTAL PROTEIN: 7.8 g/dL (ref 6.0–8.3)
Total Bilirubin: 0.5 mg/dL (ref 0.3–1.2)

## 2014-04-06 LAB — CBC WITH DIFFERENTIAL/PLATELET
BASOS ABS: 0 10*3/uL (ref 0.0–0.1)
Basophils Relative: 0 % (ref 0–1)
EOS ABS: 0 10*3/uL (ref 0.0–0.7)
EOS PCT: 0 % (ref 0–5)
HEMATOCRIT: 41 % (ref 36.0–46.0)
HEMOGLOBIN: 13.1 g/dL (ref 12.0–15.0)
LYMPHS PCT: 23 % (ref 12–46)
Lymphs Abs: 1.4 10*3/uL (ref 0.7–4.0)
MCH: 27 pg (ref 26.0–34.0)
MCHC: 32 g/dL (ref 30.0–36.0)
MCV: 84.5 fL (ref 78.0–100.0)
Monocytes Absolute: 0.4 10*3/uL (ref 0.1–1.0)
Monocytes Relative: 6 % (ref 3–12)
NEUTROS PCT: 71 % (ref 43–77)
Neutro Abs: 4.3 10*3/uL (ref 1.7–7.7)
Platelets: 297 10*3/uL (ref 150–400)
RBC: 4.85 MIL/uL (ref 3.87–5.11)
RDW: 13.6 % (ref 11.5–15.5)
WBC: 6.1 10*3/uL (ref 4.0–10.5)

## 2014-04-06 LAB — URINE MICROSCOPIC-ADD ON

## 2014-04-06 LAB — URINALYSIS, ROUTINE W REFLEX MICROSCOPIC
GLUCOSE, UA: NEGATIVE mg/dL
Hgb urine dipstick: NEGATIVE
Ketones, ur: NEGATIVE mg/dL
Nitrite: NEGATIVE
Protein, ur: NEGATIVE mg/dL
SPECIFIC GRAVITY, URINE: 1.025 (ref 1.005–1.030)
Urobilinogen, UA: 1 mg/dL (ref 0.0–1.0)
pH: 5.5 (ref 5.0–8.0)

## 2014-04-06 LAB — TROPONIN I

## 2014-04-06 LAB — LIPASE, BLOOD: Lipase: 30 U/L (ref 11–59)

## 2014-04-06 MED ORDER — ONDANSETRON 4 MG PO TBDP
8.0000 mg | ORAL_TABLET | Freq: Once | ORAL | Status: AC
  Administered 2014-04-06: 8 mg via ORAL

## 2014-04-06 MED ORDER — ONDANSETRON HCL 4 MG/2ML IJ SOLN
4.0000 mg | Freq: Once | INTRAMUSCULAR | Status: AC
  Administered 2014-04-06: 4 mg via INTRAVENOUS
  Filled 2014-04-06: qty 2

## 2014-04-06 MED ORDER — HYDROMORPHONE HCL 1 MG/ML IJ SOLN
1.0000 mg | Freq: Once | INTRAMUSCULAR | Status: AC
  Administered 2014-04-06: 1 mg via INTRAVENOUS
  Filled 2014-04-06: qty 1

## 2014-04-06 MED ORDER — MORPHINE SULFATE 4 MG/ML IJ SOLN
4.0000 mg | Freq: Once | INTRAMUSCULAR | Status: AC
  Administered 2014-04-06: 4 mg via INTRAVENOUS
  Filled 2014-04-06: qty 1

## 2014-04-06 MED ORDER — SODIUM CHLORIDE 0.9 % IV BOLUS (SEPSIS)
1000.0000 mL | Freq: Once | INTRAVENOUS | Status: AC
  Administered 2014-04-06: 1000 mL via INTRAVENOUS

## 2014-04-06 MED ORDER — ONDANSETRON 4 MG PO TBDP
ORAL_TABLET | ORAL | Status: AC
  Filled 2014-04-06: qty 2

## 2014-04-06 NOTE — ED Notes (Signed)
General surgeon at bedside.  

## 2014-04-06 NOTE — ED Provider Notes (Signed)
CSN: [id number]     Arrival date & time 04/06/14  1709 History   First MD Initiated Contact with Patient 04/06/14 1819     Chief Complaint  Patient presents with  . Abdominal Pain   (Consider location/radiation/quality/duration/timing/severity/associated sxs/prior Treatment) HPI Comments: 59 yo F hx of HTN, Cerebral aneursym, presents with CC of abdominal pain.  Pt states symptoms started this morning around 9 AM after eating bacon and grits.  Pt c/o of abdominal pain, R back, RUQ, epigastrum, with associated nausea, vomiting.  Denies fever, chills, CP, SOB, cough, diarrhea, constipation, rash, myalgias, or any other symptoms.  Prior abdominal surgeries include umbilical hernia repair as child, and C section.  Denies previous occurrence.  No meds taken at home.  No other concerns.    The history is provided by the patient. No language interpreter was used.    Past Medical History  Diagnosis Date  . Cerebral aneurysm rupture 1999  . Hypertension    Past Surgical History  Procedure Laterality Date  . Brain surgery    . Cesarean section    . Hernia repair     Family History  Problem Relation Age of Onset  . Arthritis Mother    History  Substance Use Topics  . Smoking status: Former Smoker    Quit date: 12/18/1996  . Smokeless tobacco: Never Used  . Alcohol Use: No     Comment: occasional   OB History    No data available     Review of Systems  Constitutional: Negative for fever and chills.  Respiratory: Negative for cough and shortness of breath.   Cardiovascular: Negative for chest pain.  Gastrointestinal: Positive for nausea, vomiting and abdominal pain. Negative for diarrhea.  Genitourinary: Negative for dysuria.  Musculoskeletal: Negative for myalgias.  Skin: Negative for rash.  Neurological: Negative for dizziness, light-headedness, numbness and headaches.  Hematological: Negative for adenopathy. Does not bruise/bleed easily.  All other systems reviewed and are  negative.     Allergies  Other; Peanuts; and Shellfish allergy  Home Medications   Prior to Admission medications   Medication Sig Start Date End Date Taking? Authorizing Provider  bismuth subsalicylate (PEPTO BISMOL) 262 MG/15ML suspension Take 30 mLs by mouth every 6 (six) hours as needed.   Yes Historical Provider, MD  Calcium Carbonate-Vitamin D (CALTRATE 600+D) 600-400 MG-UNIT per tablet Take 1 tablet by mouth 2 (two) times daily.     Yes Historical Provider, MD  diltiazem (CARDIZEM CD) 240 MG 24 hr capsule take 1 capsule by mouth once daily 03/17/14  Yes Shirlee Latch, MD  EPINEPHrine (EPIPEN) 0.3 mg/0.3 mL DEVI Inject 0.3 mg into the muscle as needed. for anaphylaxis    Yes Historical Provider, MD  naproxen sodium (ANAPROX) 220 MG tablet Take 220 mg by mouth 2 (two) times daily with a meal.   Yes Historical Provider, MD  triamterene-hydrochlorothiazide (MAXZIDE) 75-50 MG per tablet take 1 tablet by mouth once daily 03/17/14  Yes Shirlee Latch, MD   BP 160/77 mmHg  Pulse 68  Temp(Src) 99.4 F (37.4 C) (Oral)  Resp 16  SpO2 98% Physical Exam  Constitutional: She is oriented to person, place, and time. She appears well-developed and well-nourished.  HENT:  Head: Normocephalic and atraumatic.  Right Ear: External ear normal.  Left Ear: External ear normal.  Mouth/Throat: Oropharynx is clear and moist.  Eyes: Conjunctivae and EOM are normal. Pupils are equal, round, and reactive to light.  Neck: Normal range of motion. Neck supple.  Cardiovascular:  Normal rate, regular rhythm, normal heart sounds and intact distal pulses.   Pulmonary/Chest: Effort normal and breath sounds normal. No respiratory distress. She has no wheezes. She has no rales. She exhibits no tenderness.  Abdominal: Soft. Bowel sounds are normal. She exhibits no distension and no mass. There is tenderness. There is no rebound and no guarding.  TTP RUQ, positive Murphy's sign, no distension, no rebound, or  guarding.   Musculoskeletal: Normal range of motion.  Neurological: She is alert and oriented to person, place, and time.  Skin: Skin is warm and dry.  Nursing note and vitals reviewed.   ED Course  Procedures (including critical care time) Labs Review Labs Reviewed  COMPREHENSIVE METABOLIC PANEL - Abnormal; Notable for the following:    Glucose, Bld 119 (*)    GFR calc non Af Amer 65 (*)    GFR calc Af Amer 75 (*)    All other components within normal limits  URINALYSIS, ROUTINE W REFLEX MICROSCOPIC - Abnormal; Notable for the following:    Bilirubin Urine SMALL (*)    Leukocytes, UA SMALL (*)    All other components within normal limits  CBC WITH DIFFERENTIAL/PLATELET  LIPASE, BLOOD  TROPONIN I  URINE MICROSCOPIC-ADD ON    Imaging Review Dg Chest 2 View  04/06/2014   CLINICAL DATA:  Right side chest pain, cough, abdominal pain  EXAM: CHEST  2 VIEW  COMPARISON:  06/19/2007  FINDINGS: Cardiomediastinal silhouette is stable. No acute infiltrate or pleural effusion. No pulmonary edema. Mild degenerative changes mid and lower thoracic spine.  IMPRESSION: No active cardiopulmonary disease.   Electronically Signed   By: Natasha MeadLiviu  Pop M.D.   On: 04/06/2014 17:48   [place] Abdomen Limited  04/06/2014   CLINICAL DATA:  Right upper quadrant abdominal pain.  EXAM: US ABDOMEN LIMITED - RIGHT UPPER QUADRANT  COMPARISON:  None.  FINDINGS: Gallbladder:  Multiple shadowing gallstones, small amount of dependent sludge. There is asymmetric gallbladder wall thickening at the fundus measuring up to 6 mm. No sonographic Murphy sign noted.  Common bile duct:  Diameter: 4.6 mm  Liver:  No focal lesion identified. Within normal limits in parenchymal echogenicity.  IMPRESSION: Cholelithiasis with asymmetric gallbladder wall thickening at the fundus measuring up to 6 mm. Findings may reflect chronic gallbladder disease. Sonographic Eulah PontMurphy sign is negative. No biliary dilatation.  A nuclear medicine hepatobiliary scan  could be considered for further evaluation if there is clinical concern for acute cholecystitis.   Electronically Signed   By: Rubye OaksMelanie  Ehinger M.D.   On: 04/06/2014 21:33     EKG Interpretation   Date/Time:  Sunday April 06 2014 17:30:35 EST Ventricular Rate:  78 PR Interval:  168 QRS Duration: 76 QT Interval:  428 QTC Calculation: 487 R Axis:   14 Text Interpretation:  Normal sinus rhythm Right atrial enlargement  Anterior infarct , age undetermined Abnormal ECG No significant change  since last tracing Confirmed by JACUBOWITZ  MD, SAM [phone]([id number]) on 04/06/2014  7:19:20 PM      MDM   Final diagnoses:  Acute cholecystitis   59 yo F hx of HTN, Cerebral aneursym, presents with CC of abdominal pain.   Physical exam as above.  VS WNL.  Pt with RUQ pain, radiating from back to epigastrum, after fatty meal this morning.  Concern for gallbladder pathology.  Labs demonstrate normal WBC, and normal LFTs.  US of gallbladder ordered which demonstrated cholithiasis, with asymmetric thickening of fundus 6 mm.   Pt given NS  bolus, Zofran, Morphine 4 mg IV, Dilaudid 1 mg X 2 without much improvement in pain.  Dr. Dwain Sarna from surgery consulted, and pt to be admitted for symptomatic cholecystitis with plan for surgical removal.  Pt understands and agrees with plan.   Jon Gills  Discussed pt with my attneding Dr. Ethelda Chick.      Jon Gills, MD 04/07/14 8119  Doug Sou, MD 04/07/14 0111

## 2014-04-06 NOTE — ED Notes (Signed)
Pt c/o rtr sided chest and abd pain that moves around to her mid-abd with nv no diarrhea. No cold or cough

## 2014-04-06 NOTE — ED Provider Notes (Addendum)
Complains of right-sided flank pain radiating to right upper quadrant onset 9 AM today after eating bacon post and grits. On exam patient is alert nontoxic lungs clear auscultation heart regular rate and rhythm abdomen tender at right upper quadrant positive Murphy sign. No flank tenderness.  Doug SouSam Cecil Vandyke, MD 04/06/14 [id number]  Doug SouSam Myrella Fahs, MD 04/07/14 0111

## 2014-04-06 NOTE — H&P (Signed)
Shelley Allison is an 59 y.o. female.   Chief Complaint: ruq pain HPI: 54 yof who has htn and history of cerebral aneurysm who presents with about 14 hour history of ruq pain that occurred after breakfast. This has worsened. No prior history. Has nausea/emesis, no change in bowel movements. Has undergone [place] that shows stones and remains tender despite pain medications.  Past Medical History  Diagnosis Date  . Cerebral aneurysm rupture 1999  . Hypertension     Past Surgical History  Procedure Laterality Date  . Brain surgery    . Cesarean section    . Hernia repair      Family History  Problem Relation Age of Onset  . Arthritis Mother    Social History:  reports that she quit smoking about 17 years ago. She has never used smokeless tobacco. She reports that she does not drink alcohol or use illicit drugs.  Allergies:  Allergies  Allergen Reactions  . Other Hives, Shortness Of Breath and Rash    All Pitted Fruit  . Peanuts [Peanut Oil]   . Shellfish Allergy     Meds reviewed  Results for orders placed or performed during the hospital encounter of 04/06/14 (from the past 48 hour(s))  CBC with Differential     Status: None   Collection Time: 04/06/14  5:37 PM  Result Value Ref Range   WBC 6.1 4.0 - 10.5 K/uL   RBC 4.85 3.87 - 5.11 MIL/uL   Hemoglobin 13.1 12.0 - 15.0 g/dL   HCT 41.0 36.0 - 46.0 %   MCV 84.5 78.0 - 100.0 fL   MCH 27.0 26.0 - 34.0 pg   MCHC 32.0 30.0 - 36.0 g/dL   RDW 13.6 11.5 - 15.5 %   Platelets 297 150 - 400 K/uL   Neutrophils Relative % 71 43 - 77 %   Neutro Abs 4.3 1.7 - 7.7 K/uL   Lymphocytes Relative 23 12 - 46 %   Lymphs Abs 1.4 0.7 - 4.0 K/uL   Monocytes Relative 6 3 - 12 %   Monocytes Absolute 0.4 0.1 - 1.0 K/uL   Eosinophils Relative 0 0 - 5 %   Eosinophils Absolute 0.0 0.0 - 0.7 K/uL   Basophils Relative 0 0 - 1 %   Basophils Absolute 0.0 0.0 - 0.1 K/uL  Comprehensive metabolic panel     Status: Abnormal   Collection Time: 04/06/14   5:37 PM  Result Value Ref Range   Sodium 137 135 - 145 mmol/L   Potassium 3.6 3.5 - 5.1 mmol/L   Chloride 105 96 - 112 mmol/L   CO2 26 19 - 32 mmol/L   Glucose, Bld 119 (H) 70 - 99 mg/dL   BUN 9 6 - 23 mg/dL   Creatinine, Ser 0.94 0.50 - 1.10 mg/dL   Calcium 9.3 8.4 - 10.5 mg/dL   Total Protein 7.8 6.0 - 8.3 g/dL   Albumin 3.8 3.5 - 5.2 g/dL   AST 18 0 - 37 U/L   ALT 19 0 - 35 U/L   Alkaline Phosphatase 93 39 - 117 U/L   Total Bilirubin 0.5 0.3 - 1.2 mg/dL   GFR calc non Af Amer 65 (L) >90 mL/min   GFR calc Af Amer 75 (L) >90 mL/min    Comment: (NOTE) The eGFR has been calculated using the CKD EPI equation. This calculation has not been validated in all clinical situations. eGFR's persistently <90 mL/min signify possible Chronic Kidney Disease.    Anion  gap 6 5 - 15  Lipase, blood     Status: None   Collection Time: 04/06/14  5:37 PM  Result Value Ref Range   Lipase 30 11 - 59 U/L  Troponin I     Status: None   Collection Time: 04/06/14  5:37 PM  Result Value Ref Range   Troponin I <0.03 <0.031 ng/mL    Comment:        NO INDICATION OF MYOCARDIAL INJURY.   Urinalysis, Routine w reflex microscopic     Status: Abnormal   Collection Time: 04/06/14  6:16 PM  Result Value Ref Range   Color, Urine YELLOW YELLOW   APPearance CLEAR CLEAR   Specific Gravity, Urine 1.025 1.005 - 1.030   pH 5.5 5.0 - 8.0   Glucose, UA NEGATIVE NEGATIVE mg/dL   Hgb urine dipstick NEGATIVE NEGATIVE   Bilirubin Urine SMALL (A) NEGATIVE   Ketones, ur NEGATIVE NEGATIVE mg/dL   Protein, ur NEGATIVE NEGATIVE mg/dL   Urobilinogen, UA 1.0 0.0 - 1.0 mg/dL   Nitrite NEGATIVE NEGATIVE   Leukocytes, UA SMALL (A) NEGATIVE  Urine microscopic-add on     Status: None   Collection Time: 04/06/14  6:16 PM  Result Value Ref Range   Squamous Epithelial / LPF RARE RARE   WBC, UA 3-6 <3 WBC/hpf   RBC / HPF 0-2 <3 RBC/hpf   Bacteria, UA RARE RARE   Dg Chest 2 View  04/06/2014   CLINICAL DATA:  Right side  chest pain, cough, abdominal pain  EXAM: CHEST  2 VIEW  COMPARISON:  06/19/2007  FINDINGS: Cardiomediastinal silhouette is stable. No acute infiltrate or pleural effusion. No pulmonary edema. Mild degenerative changes mid and lower thoracic spine.  IMPRESSION: No active cardiopulmonary disease.   Electronically Signed   By: Lahoma Crocker M.D.   On: 04/06/2014 17:48   US Abdomen Limited  04/06/2014   CLINICAL DATA:  Right upper quadrant abdominal pain.  EXAM: US ABDOMEN LIMITED - RIGHT UPPER QUADRANT  COMPARISON:  None.  FINDINGS: Gallbladder:  Multiple shadowing gallstones, small amount of dependent sludge. There is asymmetric gallbladder wall thickening at the fundus measuring up to 6 mm. No sonographic Murphy sign noted.  Common bile duct:  Diameter: 4.6 mm  Liver:  No focal lesion identified. Within normal limits in parenchymal echogenicity.  IMPRESSION: Cholelithiasis with asymmetric gallbladder wall thickening at the fundus measuring up to 6 mm. Findings may reflect chronic gallbladder disease. Sonographic Percell Miller sign is negative. No biliary dilatation.  A nuclear medicine hepatobiliary scan could be considered for further evaluation if there is clinical concern for acute cholecystitis.   Electronically Signed   By: Jeb Levering M.D.   On: 04/06/2014 21:33    Review of Systems  Constitutional: Negative for fever and chills.  Respiratory: Negative for shortness of breath.   Cardiovascular: Negative for chest pain.  Gastrointestinal: Positive for nausea, vomiting and abdominal pain. Negative for diarrhea and constipation.    Blood pressure 147/78, pulse 77, temperature 99.4 F (37.4 C), temperature source Oral, resp. rate 14, SpO2 96 %. Physical Exam  Vitals reviewed. Constitutional: She is oriented to person, place, and time. She appears well-developed and well-nourished.  HENT:  Head: Normocephalic.  Eyes: No scleral icterus.  Neck: Neck supple.  Cardiovascular: Normal rate, regular  rhythm and normal heart sounds.   Respiratory: Effort normal and breath sounds normal. She has no wheezes. She has no rales.  GI: There is tenderness. There is positive Murphy's sign. No  hernia.  Lymphadenopathy:    She has no cervical adenopathy.  Neurological: She is alert and oriented to person, place, and time.     Assessment/Plan Acute cholecystitis  She has persistent pain although wbc normal. Will admit, place on abx, plan for lap chole tomorrow.  Brittain Hosie 04/06/2014, 11:45 PM

## 2014-04-07 ENCOUNTER — Encounter (HOSPITAL_COMMUNITY): Admission: EM | Disposition: A | Discharge status: Home / Self Care | Payer: Self-pay | Source: Home / Self Care

## 2014-04-07 ENCOUNTER — Inpatient Hospital Stay (HOSPITAL_COMMUNITY): Payer: BC Managed Care – PPO | Admitting: Anesthesiology

## 2014-04-07 ENCOUNTER — Encounter (HOSPITAL_COMMUNITY): Payer: Self-pay | Admitting: Certified Registered"

## 2014-04-07 HISTORY — PX: CHOLECYSTECTOMY: SHX55

## 2014-04-07 LAB — SURGICAL PCR SCREEN
MRSA, PCR: NEGATIVE
Staphylococcus aureus: POSITIVE — AB

## 2014-04-07 SURGERY — LAPAROSCOPIC CHOLECYSTECTOMY WITH INTRAOPERATIVE CHOLANGIOGRAM
Anesthesia: General | Site: Abdomen

## 2014-04-07 MED ORDER — ACETAMINOPHEN 325 MG PO TABS: 325.0000 mg | ORAL_TABLET | ORAL | Status: DC | PRN

## 2014-04-07 MED ORDER — ACETAMINOPHEN 325 MG PO TABS: 650.0000 mg | ORAL_TABLET | Freq: Four times a day (QID) | ORAL | Status: DC | PRN

## 2014-04-07 MED ORDER — 0.9 % SODIUM CHLORIDE (POUR BTL) OPTIME
TOPICAL | Status: DC | PRN
  Administered 2014-04-07: 1000 mL

## 2014-04-07 MED ORDER — PROPOFOL 10 MG/ML IV BOLUS
INTRAVENOUS | Status: DC | PRN
  Administered 2014-04-07: 140 mg via INTRAVENOUS

## 2014-04-07 MED ORDER — SODIUM CHLORIDE 0.9 % IR SOLN
Status: DC | PRN
  Administered 2014-04-07: 1

## 2014-04-07 MED ORDER — MORPHINE SULFATE 2 MG/ML IJ SOLN
2.0000 mg | INTRAMUSCULAR | Status: DC | PRN
  Administered 2014-04-07 (×4): 2 mg via INTRAVENOUS
  Filled 2014-04-07 (×5): qty 1

## 2014-04-07 MED ORDER — MIDAZOLAM HCL 5 MG/5ML IJ SOLN
INTRAMUSCULAR | Status: DC | PRN
  Administered 2014-04-07: 2 mg via INTRAVENOUS

## 2014-04-07 MED ORDER — ONDANSETRON HCL 4 MG/2ML IJ SOLN: 4.0000 mg | Freq: Four times a day (QID) | INTRAMUSCULAR | Status: DC | PRN

## 2014-04-07 MED ORDER — CHLORHEXIDINE GLUCONATE 0.12 % MT SOLN
15.0000 mL | Freq: Two times a day (BID) | OROMUCOSAL | Status: DC
Start: 2014-04-07 — End: 2014-04-08
  Administered 2014-04-07 (×2): 15 mL via OROMUCOSAL
  Filled 2014-04-07 (×2): qty 15

## 2014-04-07 MED ORDER — GLYCOPYRROLATE 0.2 MG/ML IJ SOLN
INTRAMUSCULAR | Status: DC | PRN
  Administered 2014-04-07: 0.4 mg via INTRAVENOUS

## 2014-04-07 MED ORDER — BUPIVACAINE-EPINEPHRINE (PF) 0.25% -1:200000 IJ SOLN
INTRAMUSCULAR | Status: AC
  Filled 2014-04-07: qty 30

## 2014-04-07 MED ORDER — DILTIAZEM HCL ER COATED BEADS 240 MG PO CP24
240.0000 mg | ORAL_CAPSULE | Freq: Every day | ORAL | Status: DC
Start: 2014-04-07 — End: 2014-04-08
  Administered 2014-04-07 – 2014-04-08 (×2): 240 mg via ORAL
  Filled 2014-04-07 (×2): qty 1

## 2014-04-07 MED ORDER — ACETAMINOPHEN 160 MG/5ML PO SOLN
325.0000 mg | ORAL | Status: DC | PRN
  Filled 2014-04-07: qty 20.3

## 2014-04-07 MED ORDER — CETYLPYRIDINIUM CHLORIDE 0.05 % MT LIQD
7.0000 mL | Freq: Two times a day (BID) | OROMUCOSAL | Status: DC
  Administered 2014-04-07: 7 mL via OROMUCOSAL

## 2014-04-07 MED ORDER — BUPIVACAINE-EPINEPHRINE 0.25% -1:200000 IJ SOLN
INTRAMUSCULAR | Status: DC | PRN
  Administered 2014-04-07: 30 mL

## 2014-04-07 MED ORDER — FENTANYL CITRATE 0.05 MG/ML IJ SOLN
25.0000 ug | INTRAMUSCULAR | Status: DC | PRN
  Administered 2014-04-07: 50 ug via INTRAVENOUS

## 2014-04-07 MED ORDER — ONDANSETRON HCL 4 MG/2ML IJ SOLN
INTRAMUSCULAR | Status: DC | PRN
  Administered 2014-04-07: 4 mg via INTRAVENOUS

## 2014-04-07 MED ORDER — INFLUENZA VAC SPLIT QUAD 0.5 ML IM SUSY
0.5000 mL | PREFILLED_SYRINGE | INTRAMUSCULAR | Status: AC
  Administered 2014-04-08: 0.5 mL via INTRAMUSCULAR
  Filled 2014-04-07: qty 0.5

## 2014-04-07 MED ORDER — OXYCODONE-ACETAMINOPHEN 5-325 MG PO TABS
1.0000 | ORAL_TABLET | ORAL | Status: DC | PRN
  Administered 2014-04-07: 2 via ORAL
  Filled 2014-04-07: qty 2

## 2014-04-07 MED ORDER — FENTANYL CITRATE 0.05 MG/ML IJ SOLN
INTRAMUSCULAR | Status: DC | PRN
  Administered 2014-04-07: 100 ug via INTRAVENOUS
  Administered 2014-04-07: 50 ug via INTRAVENOUS

## 2014-04-07 MED ORDER — SODIUM CHLORIDE 0.9 % IV SOLN
INTRAVENOUS | Status: DC
  Administered 2014-04-07: 02:00:00 via INTRAVENOUS

## 2014-04-07 MED ORDER — FENTANYL CITRATE 0.05 MG/ML IJ SOLN
INTRAMUSCULAR | Status: AC
  Filled 2014-04-07: qty 5

## 2014-04-07 MED ORDER — HEPARIN SODIUM (PORCINE) 5000 UNIT/ML IJ SOLN: 5000.0000 [IU] | Freq: Three times a day (TID) | INTRAMUSCULAR | Status: DC

## 2014-04-07 MED ORDER — NEOSTIGMINE METHYLSULFATE 10 MG/10ML IV SOLN
INTRAVENOUS | Status: DC | PRN
  Administered 2014-04-07: 3 mg via INTRAVENOUS

## 2014-04-07 MED ORDER — MUPIROCIN 2 % EX OINT
1.0000 "application " | TOPICAL_OINTMENT | Freq: Two times a day (BID) | CUTANEOUS | Status: DC
  Administered 2014-04-07 – 2014-04-08 (×3): 1 via NASAL
  Filled 2014-04-07: qty 22

## 2014-04-07 MED ORDER — OXYCODONE HCL 5 MG/5ML PO SOLN: 5.0000 mg | Freq: Once | ORAL | Status: DC | PRN

## 2014-04-07 MED ORDER — LACTATED RINGERS IV SOLN
INTRAVENOUS | Status: DC
  Administered 2014-04-07 (×2): via INTRAVENOUS

## 2014-04-07 MED ORDER — TRIAMTERENE-HCTZ 75-50 MG PO TABS
1.0000 | ORAL_TABLET | Freq: Every day | ORAL | Status: DC
  Administered 2014-04-07 – 2014-04-08 (×2): 1 via ORAL
  Filled 2014-04-07 (×2): qty 1

## 2014-04-07 MED ORDER — FENTANYL CITRATE 0.05 MG/ML IJ SOLN
INTRAMUSCULAR | Status: AC
  Filled 2014-04-07: qty 2

## 2014-04-07 MED ORDER — SUCCINYLCHOLINE CHLORIDE 20 MG/ML IJ SOLN
INTRAMUSCULAR | Status: AC
  Filled 2014-04-07: qty 2

## 2014-04-07 MED ORDER — OXYCODONE HCL 5 MG PO TABS: 5.0000 mg | ORAL_TABLET | Freq: Once | ORAL | Status: DC | PRN

## 2014-04-07 MED ORDER — ROCURONIUM BROMIDE 50 MG/5ML IV SOLN
INTRAVENOUS | Status: AC
  Filled 2014-04-07: qty 1

## 2014-04-07 MED ORDER — SODIUM CHLORIDE 0.9 % IV SOLN
INTRAVENOUS | Status: DC
  Administered 2014-04-07 – 2014-04-08 (×2): via INTRAVENOUS

## 2014-04-07 MED ORDER — LIDOCAINE HCL (CARDIAC) 20 MG/ML IV SOLN
INTRAVENOUS | Status: DC | PRN
  Administered 2014-04-07: 80 mg via INTRAVENOUS

## 2014-04-07 MED ORDER — ONDANSETRON HCL 4 MG/2ML IJ SOLN
INTRAMUSCULAR | Status: AC
  Filled 2014-04-07: qty 4

## 2014-04-07 MED ORDER — LIDOCAINE HCL (CARDIAC) 20 MG/ML IV SOLN
INTRAVENOUS | Status: AC
  Filled 2014-04-07: qty 10

## 2014-04-07 MED ORDER — ROCURONIUM BROMIDE 100 MG/10ML IV SOLN
INTRAVENOUS | Status: DC | PRN
  Administered 2014-04-07: 40 mg via INTRAVENOUS

## 2014-04-07 MED ORDER — PROPOFOL 10 MG/ML IV BOLUS
INTRAVENOUS | Status: AC
  Filled 2014-04-07: qty 20

## 2014-04-07 MED ORDER — CHLORHEXIDINE GLUCONATE CLOTH 2 % EX PADS
6.0000 | MEDICATED_PAD | Freq: Every day | CUTANEOUS | Status: DC
  Administered 2014-04-07 – 2014-04-08 (×2): 6 via TOPICAL

## 2014-04-07 MED ORDER — ACETAMINOPHEN 650 MG RE SUPP: 650.0000 mg | Freq: Four times a day (QID) | RECTAL | Status: DC | PRN

## 2014-04-07 MED ORDER — CEFTRIAXONE SODIUM IN DEXTROSE 20 MG/ML IV SOLN
1.0000 g | INTRAVENOUS | Status: DC
  Administered 2014-04-07 – 2014-04-08 (×2): 1 g via INTRAVENOUS
  Filled 2014-04-07 (×2): qty 50

## 2014-04-07 MED ORDER — MIDAZOLAM HCL 2 MG/2ML IJ SOLN
INTRAMUSCULAR | Status: AC
  Filled 2014-04-07: qty 2

## 2014-04-07 SURGICAL SUPPLY — 44 items
CANISTER SUCTION 2500CC (MISCELLANEOUS) ×3 IMPLANT
CHLORAPREP W/TINT 26ML (MISCELLANEOUS) ×3 IMPLANT
CLOSURE STERI-STRIP 1/2X4 (GAUZE/BANDAGES/DRESSINGS) ×1
CLSR STERI-STRIP ANTIMIC 1/2X4 (GAUZE/BANDAGES/DRESSINGS) ×2 IMPLANT
COVER MAYO STAND STRL (DRAPES) ×3 IMPLANT
COVER SURGICAL LIGHT HANDLE (MISCELLANEOUS) ×3 IMPLANT
DRAPE C-ARM 42X72 X-RAY (DRAPES) ×3 IMPLANT
DRAPE LAPAROSCOPIC ABDOMINAL (DRAPES) ×3 IMPLANT
DRSG TEGADERM 2-3/8X2-3/4 SM (GAUZE/BANDAGES/DRESSINGS) ×12 IMPLANT
DRSG TEGADERM 4X4.75 (GAUZE/BANDAGES/DRESSINGS) ×3 IMPLANT
ELECT REM PT RETURN 9FT ADLT (ELECTROSURGICAL) ×3
ELECTRODE REM PT RTRN 9FT ADLT (ELECTROSURGICAL) ×1 IMPLANT
GLOVE BIO SURGEON STRL SZ 6.5 (GLOVE) ×2 IMPLANT
GLOVE BIO SURGEON STRL SZ7 (GLOVE) ×3 IMPLANT
GLOVE BIO SURGEONS STRL SZ 6.5 (GLOVE) ×1
GLOVE BIOGEL PI IND STRL 7.0 (GLOVE) ×1 IMPLANT
GLOVE BIOGEL PI IND STRL 8 (GLOVE) ×1 IMPLANT
GLOVE BIOGEL PI INDICATOR 7.0 (GLOVE) ×2
GLOVE BIOGEL PI INDICATOR 8 (GLOVE) ×2
GLOVE BIOGEL PI ORTHO PRO SZ7 (GLOVE) ×2
GLOVE ECLIPSE 7.5 STRL STRAW (GLOVE) ×3 IMPLANT
GLOVE PI ORTHO PRO STRL SZ7 (GLOVE) ×1 IMPLANT
GLOVE SURG SS PI 6.5 STRL IVOR (GLOVE) ×6 IMPLANT
GOWN STRL REUS W/ TWL LRG LVL3 (GOWN DISPOSABLE) ×3 IMPLANT
GOWN STRL REUS W/TWL LRG LVL3 (GOWN DISPOSABLE) ×6
KIT BASIN OR (CUSTOM PROCEDURE TRAY) ×3 IMPLANT
KIT ROOM TURNOVER OR (KITS) ×3 IMPLANT
LIQUID BAND (GAUZE/BANDAGES/DRESSINGS) ×3 IMPLANT
NS IRRIG 1000ML POUR BTL (IV SOLUTION) ×3 IMPLANT
PAD ARMBOARD 7.5X6 YLW CONV (MISCELLANEOUS) ×3 IMPLANT
POUCH SPECIMEN RETRIEVAL 10MM (ENDOMECHANICALS) IMPLANT
SCISSORS LAP 5X35 DISP (ENDOMECHANICALS) ×3 IMPLANT
SET CHOLANGIOGRAPH 5 50 .035 (SET/KITS/TRAYS/PACK) ×3 IMPLANT
SET IRRIG TUBING LAPAROSCOPIC (IRRIGATION / IRRIGATOR) ×3 IMPLANT
SLEEVE ENDOPATH XCEL 5M (ENDOMECHANICALS) ×3 IMPLANT
SPECIMEN JAR SMALL (MISCELLANEOUS) ×3 IMPLANT
SUT MNCRL AB 4-0 PS2 18 (SUTURE) ×3 IMPLANT
TOWEL OR 17X24 6PK STRL BLUE (TOWEL DISPOSABLE) ×3 IMPLANT
TOWEL OR 17X26 10 PK STRL BLUE (TOWEL DISPOSABLE) ×3 IMPLANT
TRAY LAPAROSCOPIC (CUSTOM PROCEDURE TRAY) ×3 IMPLANT
TROCAR XCEL BLUNT TIP 100MML (ENDOMECHANICALS) ×3 IMPLANT
TROCAR XCEL NON-BLD 11X100MML (ENDOMECHANICALS) IMPLANT
TROCAR XCEL NON-BLD 5MMX100MML (ENDOMECHANICALS) ×3 IMPLANT
TUBING INSUFFLATION (TUBING) ×3 IMPLANT

## 2014-04-07 NOTE — Anesthesia Postprocedure Evaluation (Signed)
  Anesthesia Post-op Note  Patient: Shelley Allison  Procedure(s) Performed: Procedure(s): LAPAROSCOPIC CHOLECYSTECTOMY (N/A)  Patient Location: PACU  Anesthesia Type:General  Level of Consciousness: awake  Airway and Oxygen Therapy: Patient Spontanous Breathing and Patient connected to nasal cannula oxygen  Post-op Pain: mild  Post-op Assessment: Post-op Vital signs reviewed, Patient's Cardiovascular Status Stable, Respiratory Function Stable, Patent Airway, No signs of Nausea or vomiting and Pain level controlled  Post-op Vital Signs: Reviewed and stable  Last Vitals:  Filed Vitals:   04/07/14 1402  BP: 168/86  Pulse: 76  Temp: 36.7 C  Resp: 16    Complications: No apparent anesthesia complications

## 2014-04-07 NOTE — Progress Notes (Signed)
Patient still tender in the RUQ.  Previous umbilical hernia repair as a child.  No mesh.  C-sxn.  Discussed risks and benefits with patient.  She wished to proceed.  To OR today about 211.  Marta LamasJames O. Gae BonWyatt, III, MD, FACS [phone]--pager [phone]--office [facility]

## 2014-04-07 NOTE — Progress Notes (Signed)
ANTIBIOTIC CONSULT NOTE - INITIAL  Pharmacy Consult for Ceftriaxone  Indication: Cholecystitis  Allergies  Allergen Reactions  . Other Hives, Shortness Of Breath and Rash    All Pitted Fruit  . Peanuts [Peanut Oil]   . Shellfish Allergy     Patient Measurements: ~90 kg  Vital Signs: Temp: 99.4 F (37.4 C) (02/21 1723) Temp Source: Oral (02/21 1723) BP: 147/78 mmHg (02/21 2330) Pulse Rate: 77 (02/21 2330)  Labs:  Recent Labs  04/06/14 1737  WBC 6.1  HGB 13.1  PLT 297  CREATININE 0.94    Assessment: Ceftriaxone for cholecystitis. WBC WNL. Likely surgery today.   Plan:  -Ceftriaxone 1g IV q24h -Trend WBC, temp  Shelley Pooley 04/07/2014,1:18 AM

## 2014-04-07 NOTE — Anesthesia Preprocedure Evaluation (Addendum)
Anesthesia Evaluation  Patient identified by MRN, date of birth, ID band Patient awake    Reviewed: Allergy & Precautions, NPO status , Patient's Chart, lab work & pertinent test results  Airway Mallampati: I  TM Distance: >3 FB Neck ROM: Full    Dental  (+) Teeth Intact, Dental Advisory Given   Pulmonary former smoker,          Cardiovascular hypertension, Pt. on medications     Neuro/Psych CVA, No Residual Symptoms    GI/Hepatic   Endo/Other    Renal/GU      Musculoskeletal   Abdominal   Peds  Hematology   Anesthesia Other Findings   Reproductive/Obstetrics                            Anesthesia Physical Anesthesia Plan  ASA: III  Anesthesia Plan: General   Post-op Pain Management:    Induction: Intravenous  Airway Management Planned: Oral ETT  Additional Equipment:   Intra-op Plan:   Post-operative Plan: Extubation in OR  Informed Consent: I have reviewed the patients History and Physical, chart, labs and discussed the procedure including the risks, benefits and alternatives for the proposed anesthesia with the patient or authorized representative who has indicated his/her understanding and acceptance.     Plan Discussed with: Anesthesiologist and Surgeon  Anesthesia Plan Comments:         Anesthesia Quick Evaluation

## 2014-04-07 NOTE — Op Note (Signed)
OPERATIVE REPORT  DATE OF OPERATION: 04/06/2014 - 04/07/2014  PATIENT:  Shelley Allison  59 y.o. female  PRE-OPERATIVE DIAGNOSIS:  Acute cholecystitis with cholelithiasis  POST-OPERATIVE DIAGNOSIS:  Acute cholecystitis with cholelithiasis  PROCEDURE:  Procedure(s): LAPAROSCOPIC CHOLECYSTECTOMY  SURGEON:  Surgeon(s): Frederik SchmidtJay Tenya Araque, MD  ASSISTANT: Dort, PA-C  ANESTHESIA:   general  EBL: <20 ml  BLOOD ADMINISTERED: none  DRAINS: none   SPECIMEN:  Source of Specimen:  Gallbladder with stones  COUNTS CORRECT:  YES  PROCEDURE DETAILS: The patient was taken to the operating room and placed on the table in the supine position.  After an adequate endotracheal anesthetic was administered, the patient was prepped with ChloroPrep, and then draped in the usual manner exposing the entire abdomen laterally, inferiorly and up  to the costal margins.  After a proper timeout was performed including identifying the patient and the procedure to be performed, a supraumbilical 1.5cm midline incision was made using a #15 blade.  This was taken down to the fascia which was then incised with a #15 blade.  The edges of the fascia were tented up with Kocher clamps as the preperitoneal space was penetrated with a Kelly clamp into the peritoneum.  Once this was done, a pursestring suture of 0 Vicryl was passed around the fascial opening.  This was subsequently used to secure the [facility] cannula which was passed into the peritoneal cavity.  Once the [facility] cannula was in place, carbon dioxide gas was insufflated into the peritoneal cavity up to a maximal intra-abdominal pressure of 15mm Hg.The laparoscope, with attached camera and light source, was passed into the peritoneal cavity to visualize the direct insertion of two right upper quadrant 5mm cannulas, and a sup-xiphoid 5mm cannula.  Once all cannulas were in place, the dissection was begun.  Two ratcheted graspers were attached to the dome and infundibulum of  the gallbladder and retracted towards the anterior abdominal wall and the right upper quadrant.  Using cautery attached to a dissecting forceps, the peritoneum overlaying the triangle of Chalot and the hepatoduodenal triangle was dissected away exposing the cystic duct and the cystic artery.  The cystic artery was clipped proximally and distally then transected.  A clip was placed on the gallbladder side of the cystic duct, then the distal cystic duct was clipped multiple times then transected.  The gallbladder was then dissected out of the hepatic bed without event.  It was retrieved from the abdomen (using an EndoCatch bag) without event.  Once the gallbladder was removed, the bed was inspected for hemostasis.  Once excellent hemostasis was obtained all gas and fluids were aspirated from above the liver, then the cannulas were removed.  The supraumbilical incision was closed using the pursestring suture which was in place.  0.25% bupivicaine with epinephrine was injected at all sites.  All 10mm or greater cannula sites were close using a running subcuticular stitch of 4-0 Monocryl.  5.710mm cannula sites were closed with Dermabond only.Steri-Strips and Tagaderm were used to complete the dressings at all sites.  At this point all needle, sponge, and instrument counts were correct.The patient was awakened from anesthesia and taken to the PACU in stable condition.    PATIENT DISPOSITION:  PACU - hemodynamically stable.   Lanelle Lindo, JAY 2/22/201612:18 PM

## 2014-04-07 NOTE — Transfer of Care (Signed)
Immediate Anesthesia Transfer of Care Note  Patient: Shelley CoeAnita P Allison  Procedure(s) Performed: Procedure(s): LAPAROSCOPIC CHOLECYSTECTOMY (N/A)  Patient Location: PACU  Anesthesia Type:General  Level of Consciousness: awake, alert  and oriented  Airway & Oxygen Therapy: Patient Spontanous Breathing and Patient connected to face mask oxygen  Post-op Assessment: Report given to RN and Post -op Vital signs reviewed and stable  Post vital signs: Reviewed and stable  Last Vitals:  Filed Vitals:   04/07/14 1237  BP: 158/71  Pulse: 75  Temp: 37.1 C  Resp: 19    Complications: No apparent anesthesia complications

## 2014-04-07 NOTE — Anesthesia Procedure Notes (Signed)
Procedure Name: Intubation Date/Time: 04/07/2014 11:13 AM Performed by: Arlice ColtMANESS, Tiara Maultsby B Pre-anesthesia Checklist: Patient identified, Emergency Drugs available, Suction available, Patient being monitored and Timeout performed Patient Re-evaluated:Patient Re-evaluated prior to inductionOxygen Delivery Method: Circle system utilized Preoxygenation: Pre-oxygenation with 100% oxygen Intubation Type: IV induction and Rapid sequence Laryngoscope Size: Mac and 3 Grade View: Grade I Tube type: Oral Tube size: 7.5 mm Number of attempts: 1 Airway Equipment and Method: Stylet Placement Confirmation: ETT inserted through vocal cords under direct vision,  positive ETCO2 and breath sounds checked- equal and bilateral Secured at: 21 cm Tube secured with: Tape Dental Injury: Teeth and Oropharynx as per pre-operative assessment

## 2014-04-08 ENCOUNTER — Encounter (HOSPITAL_COMMUNITY): Payer: Self-pay | Admitting: General Surgery

## 2014-04-08 DIAGNOSIS — K810 Acute cholecystitis: Secondary | ICD-10-CM | POA: Diagnosis present

## 2014-04-08 MED ORDER — OXYCODONE-ACETAMINOPHEN 5-325 MG PO TABS: 1.0000 | ORAL_TABLET | Freq: Four times a day (QID) | ORAL | Status: DC | PRN

## 2014-04-08 MED ORDER — MUPIROCIN 2 % EX OINT: 1.0000 "application " | TOPICAL_OINTMENT | Freq: Two times a day (BID) | CUTANEOUS | Status: AC

## 2014-04-08 NOTE — Progress Notes (Signed)
Discharge home. Home discharge instruction given, no questions verbalized. 

## 2014-04-08 NOTE — Discharge Instructions (Signed)

## 2014-04-08 NOTE — Discharge Summary (Signed)
[place]   Patient ID: Shelley Allison MRN: [id number] DOB/AGE: [id number] y.o.  Admit date: 04/06/2014 Discharge date: 04/08/2014  Admitting Diagnosis: Acute cholecystitis  Discharge Diagnosis Patient Active Problem List   Diagnosis Date Noted  . Cholecystitis, acute 04/08/2014  . CEREBRAL ANEURYSM, RUPTURED 10/11/2006  . OBESITY, NOS 04/13/2006  . HYPERTENSION, BENIGN SYSTEMIC 04/13/2006    Consultants None  Imaging: Dg Chest 2 View  04/06/2014   CLINICAL DATA:  Right side chest pain, cough, abdominal pain  EXAM: CHEST  2 VIEW  COMPARISON:  06/19/2007  FINDINGS: Cardiomediastinal silhouette is stable. No acute infiltrate or pleural effusion. No pulmonary edema. Mild degenerative changes mid and lower thoracic spine.  IMPRESSION: No active cardiopulmonary disease.   Electronically Signed   By: Shelley Allison M.D.   On: 04/06/2014 17:48   [place] Abdomen Limited  04/06/2014   CLINICAL DATA:  Right upper quadrant abdominal pain.  EXAM: US ABDOMEN LIMITED - RIGHT UPPER QUADRANT  COMPARISON:  None.  FINDINGS: Gallbladder:  Multiple shadowing gallstones, small amount of dependent sludge. There is asymmetric gallbladder wall thickening at the fundus measuring up to 6 mm. No sonographic Murphy sign noted.  Common bile duct:  Diameter: 4.6 mm  Liver:  No focal lesion identified. Within normal limits in parenchymal echogenicity.  IMPRESSION: Cholelithiasis with asymmetric gallbladder wall thickening at the fundus measuring up to 6 mm. Findings may reflect chronic gallbladder disease. Sonographic Shelley Allison sign is negative. No biliary dilatation.  A nuclear medicine hepatobiliary scan could be considered for further evaluation if there is clinical concern for acute cholecystitis.   Electronically Signed   By: Shelley Allison M.D.   On: 04/06/2014 21:33    Procedures Dr. Lindie Allison (04/07/14) - [facility]aroscopic [facility]cyst[facility]   Hospital Course:  [age] yof who  has htn and history of cerebral aneurysm who presents with about 14 hour history of ruq pain that occurred after breakfast. This has worsened. No prior history. Has nausea/emesis, no change in bowel movements. Has undergone us that shows stones and remains tender despite pain medications.  Workup showed acute cholecystitis.  Patient was admitted and underwent procedure listed above.  Tolerated procedure well and was transferred to the floor.  Diet was advanced as tolerated.  On POD #1, the patient was voiding well, tolerating diet, ambulating well, pain well controlled, vital signs stable, incisions c/d/i and felt stable for discharge home.  Patient will follow up in our office in 2 weeks and knows to call with questions or concerns.  Physical Exam: General:  Alert, NAD, pleasant, comfortable Abd:  Soft, ND, mild tenderness, incisions C/D/I     Medication List    TAKE these medications        bismuth subsalicylate 262 MG/15ML suspension  Commonly known as:  PEPTO BISMOL  Take 30 mLs by mouth every 6 (six) hours as needed.     CALTRATE 600+D 600-400 MG-UNIT per tablet  Generic drug:  Calcium Carbonate-Vitamin D  Take 1 tablet by mouth 2 (two) times daily.     diltiazem 240 MG 24 hr capsule  Commonly known as:  CARDIZEM CD  take 1 capsule by mouth once daily     EPIPEN 0.3 mg/0.3 mL Devi  Generic drug:  EPINEPHrine  Inject 0.3 mg into the muscle as needed. for anaphylaxis     mupirocin ointment 2 %  Commonly known as:  BACTROBAN  Place 1 application into the nose 2 (two) times daily. PLEASE GIVE  HER THE BOTTLE IN HER ROOM     naproxen sodium 220 MG tablet  Commonly known as:  ANAPROX  Take 220 mg by mouth 2 (two) times daily with a meal.     oxyCODONE-acetaminophen 5-325 MG per tablet  Commonly known as:  PERCOCET/ROXICET  Take 1-2 tablets by mouth every 6 (six) hours as needed for moderate pain.     triamterene-hydrochlorothiazide 75-50 MG per tablet  Commonly known as:   MAXZIDE  take 1 tablet by mouth once daily         Follow-up Information    Follow up with [facility] [place] On 04/29/2014.   Why:  For post-operation check. Your appointment is at 2:30pm, please arrive at least 30 min before your appointment to complete your check in paperwork.  If you are unable to arrive 30 min prior to your appointment time we may have to cancel or reschedule you   Contact information:   [address] [place] [address] [phone]      Signed: Aris Allison, Shelley Allison [phone]  04/08/2014, 9:16 AM

## 2014-04-21 ENCOUNTER — Encounter: Payer: Self-pay | Admitting: Family Medicine

## 2014-04-25 ENCOUNTER — Ambulatory Visit (INDEPENDENT_AMBULATORY_CARE_PROVIDER_SITE_OTHER): Payer: BC Managed Care – PPO | Admitting: Family Medicine

## 2014-04-25 ENCOUNTER — Encounter: Payer: Self-pay | Admitting: Family Medicine

## 2014-04-25 VITALS — BP 135/90 | HR 81 | Temp 98.0°F | Ht 65.0 in | Wt 195.6 lb

## 2014-04-25 DIAGNOSIS — I10 Essential (primary) hypertension: Secondary | ICD-10-CM | POA: Diagnosis not present

## 2014-04-25 DIAGNOSIS — Z0000 Encounter for general adult medical examination without abnormal findings: Secondary | ICD-10-CM | POA: Diagnosis not present

## 2014-04-25 LAB — POCT GLYCOSYLATED HEMOGLOBIN (HGB A1C): HEMOGLOBIN A1C: 5.8

## 2014-04-25 LAB — LIPID PANEL
CHOLESTEROL: 161 mg/dL (ref 0–200)
HDL: 42 mg/dL — AB (ref >=46)
LDL Cholesterol: 97 mg/dL (ref 0–99)
TRIGLYCERIDES: 108 mg/dL (ref <150)
Total CHOL/HDL Ratio: 3.8 Ratio
VLDL: 22 mg/dL (ref 0–40)

## 2014-04-25 MED ORDER — TRIAMTERENE-HCTZ 75-50 MG PO TABS: 1.0000 | ORAL_TABLET | Freq: Every day | ORAL | Status: DC

## 2014-04-25 MED ORDER — DILTIAZEM HCL ER COATED BEADS 240 MG PO CP24: 240.0000 mg | ORAL_CAPSULE | Freq: Every day | ORAL | Status: DC

## 2014-04-25 NOTE — Progress Notes (Signed)
   Subjective:   Shelley Allison is a 59 y.o. female with a history of HTN here for annual physical  HTN: - was last seen in clinic in 12/2012 for HTN - taking Cardizem 240mg  daily and Maxzide 75-50 daily and reports compliance with medication - Denies any medication side effects - Checks BP at home - runs 130s/80s - Exercise: walks in neighborhood, runs around kitchen at work - Denies any CP, SOB, palpitations, LE edema, dizziness, vision changes  HCM: - UTD on colonoscopy (2010) - UTD on flu and tetanus vaccines - Last Pap smear in 2011 - normal, but no HPV co-testing - patient declines at this time - Last mammogram in 02/2012  Review of Systems:  Per HPI. All other systems reviewed and are negative.   PMH, PSH, Medications, Allergies, and FmHx reviewed and updated in EMR.  Social History: former smoker - quit in 1998  Objective:  BP 135/90 mmHg  Pulse 81  Temp(Src) 98 F (36.7 C) (Oral)  Ht 5\' 5"  (1.651 m)  Wt 195 lb 9.6 oz (88.724 kg)  BMI 32.55 kg/m2  Gen:  [age] y.o. female in NAD HEENT: NCAT, MMM, EOMI, PERRL, anicteric sclerae, TMs clear b/l, OP clear CV: RRR, no MRG, no JVD Resp: Non-labored, CTAB, no wheezes noted Abd: Soft, NTND, BS present, no guarding or organomegaly Ext: WWP, no edema MSK: Full ROM, strength intact Neuro: Alert and oriented, speech normal      Chemistry      Component Value Date/Time   NA 137 04/06/2014 1737   K 3.6 04/06/2014 1737   CL 105 04/06/2014 1737   CO2 26 04/06/2014 1737   BUN 9 04/06/2014 1737   CREATININE 0.94 04/06/2014 1737   CREATININE 0.99 12/18/2012 1649      Component Value Date/Time   CALCIUM 9.3 04/06/2014 1737   ALKPHOS 93 04/06/2014 1737   AST 18 04/06/2014 1737   ALT 19 04/06/2014 1737   BILITOT 0.5 04/06/2014 1737      Assessment:     Shelley Allison is a 59 y.o. female here for annual physical.    Plan:     See problem list for problem-specific plans.   Erasmo DownerAngela M Bacigalupo, MD PGY-1,  [facility] 04/25/2014  4:31 PM

## 2014-04-25 NOTE — Assessment & Plan Note (Signed)
UTD on flu and tetanus UTD on colonoscopy (2010) Patient to call breast center to schedule mammogram Patient to schedule appt for pap smear at her convenience

## 2014-04-25 NOTE — Patient Instructions (Addendum)
It was nice to meet you today. Continue to take your current medications for blood pressure. Please call and schedule a mammogram. Please make an appointment with me when it's convenient to have a Pap smear.  Take care, Dr. B  Things to do to keep yourself healthy  - Exercise at least 30-45 minutes a day, 3-4 days a week.  - Eat a low-fat diet with lots of fruits and vegetables, up to 7-9 servings per day.  - Seatbelts can save your life. Wear them always.  - Smoke detectors on every level of your home, check batteries every year.  - Eye Doctor - have an eye exam every 1-2 years  - Safe sex - if you may be exposed to STDs, use a condom.  - Alcohol -  If you drink, do it moderately, less than 2 drinks per day.  - Health Care Power of Attorney. Choose someone to speak for you if you are not able.  - Depression is common in our stressful world.If you're feeling down or losing interest in things you normally enjoy, please come in for a visit.  - Violence - If anyone is threatening or hurting you, please call immediately.

## 2014-04-25 NOTE — Assessment & Plan Note (Signed)
Currently well controlled Continue diltiazem and Maxzide at current doses Recent BMET on file Check A1c and lipid panel today Follow-up in one year at next physical

## 2014-04-28 ENCOUNTER — Encounter: Payer: Self-pay | Admitting: Family Medicine

## 2014-04-29 ENCOUNTER — Other Ambulatory Visit: Payer: Self-pay

## 2014-04-29 DIAGNOSIS — Z1231 Encounter for screening mammogram for malignant neoplasm of breast: Secondary | ICD-10-CM

## 2014-05-07 ENCOUNTER — Ambulatory Visit: Payer: BC Managed Care – PPO | Admitting: Family Medicine

## 2014-05-07 ENCOUNTER — Other Ambulatory Visit (HOSPITAL_COMMUNITY)
Admission: RE | Admit: 2014-05-07 | Discharge: 2014-05-07 | Disposition: A | Discharge status: Home / Self Care | Payer: BC Managed Care – PPO | Source: Ambulatory Visit | Attending: Family Medicine | Admitting: Family Medicine

## 2014-05-07 ENCOUNTER — Encounter: Payer: Self-pay | Admitting: Family Medicine

## 2014-05-07 ENCOUNTER — Ambulatory Visit (INDEPENDENT_AMBULATORY_CARE_PROVIDER_SITE_OTHER): Payer: BC Managed Care – PPO | Admitting: Family Medicine

## 2014-05-07 VITALS — BP 136/96 | HR 59 | Temp 98.4°F | Ht 65.0 in | Wt 193.0 lb

## 2014-05-07 DIAGNOSIS — Z202 Contact with and (suspected) exposure to infections with a predominantly sexual mode of transmission: Secondary | ICD-10-CM

## 2014-05-07 DIAGNOSIS — Z20828 Contact with and (suspected) exposure to other viral communicable diseases: Secondary | ICD-10-CM

## 2014-05-07 DIAGNOSIS — Z01419 Encounter for gynecological examination (general) (routine) without abnormal findings: Secondary | ICD-10-CM | POA: Diagnosis present

## 2014-05-07 DIAGNOSIS — N898 Other specified noninflammatory disorders of vagina: Secondary | ICD-10-CM

## 2014-05-07 DIAGNOSIS — Z124 Encounter for screening for malignant neoplasm of cervix: Secondary | ICD-10-CM | POA: Diagnosis not present

## 2014-05-07 DIAGNOSIS — Z1151 Encounter for screening for human papillomavirus (HPV): Secondary | ICD-10-CM | POA: Diagnosis present

## 2014-05-07 DIAGNOSIS — Z113 Encounter for screening for infections with a predominantly sexual mode of transmission: Secondary | ICD-10-CM | POA: Insufficient documentation

## 2014-05-07 DIAGNOSIS — Z0000 Encounter for general adult medical examination without abnormal findings: Secondary | ICD-10-CM

## 2014-05-07 LAB — POCT WET PREP (WET MOUNT)
CLUE CELLS WET PREP WHIFF POC: NEGATIVE
Trichomonas Wet Prep HPF POC: NEGATIVE
WBC WET PREP: NEGATIVE

## 2014-05-07 NOTE — Progress Notes (Signed)
59 y.o. year old female presents for well woman/preventative visit and pap smear.  Acute Concerns: none   Social:  History   Social History  . Marital Status: Divorced    Spouse Name: N/A  . Number of Children: N/A  . Years of Education: N/A   Occupational History  . custodian      [employer]    Social History Main Topics  . Smoking status: Former Smoker    Quit date: 12/18/1996  . Smokeless tobacco: Never Used  . Alcohol Use: No     Comment: occasional  . Drug Use: No  . Sexual Activity: Not on file   Other Topics Concern  . None   Social History Narrative     Physical Exam: Filed Vitals:   05/07/14 1454  BP: 136/96  Pulse: 59  Temp: 98.4 F (36.9 C)    GEN: NAD, WDWN GU/GYN:Exam performed in the presence of a chaperone. External genitalia within normal limits.  Vaginal mucosa pink, moist, normal rugae.  Nonfriable cervix without lesions, no discharge or bleeding noted on speculum exam.      ASSESSMENT & PLAN: 59 y.o. female presents for annual well woman/preventative exam and GYN exam. Please see problem specific assessment and plan.    Erasmo DownerAngela M Bacigalupo, MD, MPH PGY-1,  [facility]

## 2014-05-07 NOTE — Patient Instructions (Signed)
IT was nice to see you again today.  Someone will call you with the results of the pap smear and STD tests when available.  Take care, Dr. B  Things to do to keep yourself healthy  - Exercise at least 30-45 minutes a day, 3-4 days a week.  - Eat a low-fat diet with lots of fruits and vegetables, up to 7-9 servings per day.  - Seatbelts can save your life. Wear them always.  - Smoke detectors on every level of your home, check batteries every year.  - Eye Doctor - have an eye exam every 1-2 years  - Safe sex - if you may be exposed to STDs, use a condom.  - Alcohol -  If you drink, do it moderately, less than 2 drinks per day.  - Health Care Power of Attorney. Choose someone to speak for you if you are not able.  - Depression is common in our stressful world.If you're feeling down or losing interest in things you normally enjoy, please come in for a visit.  - Violence - If anyone is threatening or hurting you, please call immediately.

## 2014-05-07 NOTE — Assessment & Plan Note (Signed)
Pap smear performed today Also screening for STDs - GC/CT, wet prep, HIV, RPR Patient scheduled for mammogram next week UTD on all other screening

## 2014-05-08 LAB — RPR

## 2014-05-08 LAB — HIV ANTIBODY (ROUTINE TESTING W REFLEX): HIV 1&2 Ab, 4th Generation: NONREACTIVE

## 2014-05-08 LAB — CERVICOVAGINAL ANCILLARY ONLY
Chlamydia: NEGATIVE
Neisseria Gonorrhea: NEGATIVE

## 2014-05-08 LAB — CYTOLOGY - PAP

## 2014-05-12 ENCOUNTER — Telehealth: Payer: Self-pay | Admitting: Family Medicine

## 2014-05-12 ENCOUNTER — Ambulatory Visit
Admission: RE | Admit: 2014-05-12 | Discharge: 2014-05-12 | Disposition: A | Discharge status: Home / Self Care | Payer: BC Managed Care – PPO | Source: Ambulatory Visit

## 2014-05-12 DIAGNOSIS — Z1231 Encounter for screening mammogram for malignant neoplasm of breast: Secondary | ICD-10-CM

## 2014-05-12 NOTE — Telephone Encounter (Signed)
Called, no answer, no voicemail. Will try again later.

## 2014-05-12 NOTE — Telephone Encounter (Signed)
RPR, HIV, GC/CT, wet prep negative  Pap normal.  Erasmo DownerAngela M Bacigalupo, MD, MPH PGY-1,  [facility] 05/12/2014 8:36 AM

## 2014-05-12 NOTE — Telephone Encounter (Signed)
Pt informed. Deseree Blount, CMA  

## 2015-01-28 ENCOUNTER — Other Ambulatory Visit: Payer: Self-pay | Admitting: Family Medicine

## 2015-04-10 ENCOUNTER — Emergency Department (EMERGENCY_DEPARTMENT_HOSPITAL)
Admit: 2015-04-10 | Discharge: 2015-04-10 | Disposition: A | Discharge status: Home / Self Care | Payer: BC Managed Care – PPO | Attending: Emergency Medicine | Admitting: Emergency Medicine

## 2015-04-10 ENCOUNTER — Emergency Department (HOSPITAL_COMMUNITY)
Admission: EM | Admit: 2015-04-10 | Discharge: 2015-04-10 | Disposition: A | Discharge status: Home / Self Care | Payer: BC Managed Care – PPO | Attending: Emergency Medicine | Admitting: Emergency Medicine

## 2015-04-10 ENCOUNTER — Emergency Department (HOSPITAL_COMMUNITY): Payer: BC Managed Care – PPO

## 2015-04-10 ENCOUNTER — Encounter (HOSPITAL_COMMUNITY): Payer: Self-pay | Admitting: Emergency Medicine

## 2015-04-10 DIAGNOSIS — I10 Essential (primary) hypertension: Secondary | ICD-10-CM | POA: Diagnosis not present

## 2015-04-10 DIAGNOSIS — Z791 Long term (current) use of non-steroidal anti-inflammatories (NSAID): Secondary | ICD-10-CM | POA: Insufficient documentation

## 2015-04-10 DIAGNOSIS — Z79899 Other long term (current) drug therapy: Secondary | ICD-10-CM | POA: Insufficient documentation

## 2015-04-10 DIAGNOSIS — M5136 Other intervertebral disc degeneration, lumbar region: Secondary | ICD-10-CM | POA: Diagnosis not present

## 2015-04-10 DIAGNOSIS — M51369 Other intervertebral disc degeneration, lumbar region without mention of lumbar back pain or lower extremity pain: Secondary | ICD-10-CM

## 2015-04-10 DIAGNOSIS — M5416 Radiculopathy, lumbar region: Secondary | ICD-10-CM | POA: Diagnosis not present

## 2015-04-10 DIAGNOSIS — Z87891 Personal history of nicotine dependence: Secondary | ICD-10-CM | POA: Insufficient documentation

## 2015-04-10 DIAGNOSIS — M79604 Pain in right leg: Secondary | ICD-10-CM | POA: Diagnosis present

## 2015-04-10 MED ORDER — PREDNISONE 20 MG PO TABS: ORAL_TABLET | ORAL | Status: DC

## 2015-04-10 MED ORDER — DEXAMETHASONE SODIUM PHOSPHATE 10 MG/ML IJ SOLN
10.0000 mg | Freq: Once | INTRAMUSCULAR | Status: AC
  Administered 2015-04-10: 10 mg via INTRAMUSCULAR
  Filled 2015-04-10: qty 1

## 2015-04-10 MED ORDER — MELOXICAM 7.5 MG PO TABS: 7.5000 mg | ORAL_TABLET | Freq: Every day | ORAL | Status: DC

## 2015-04-10 MED ORDER — METHOCARBAMOL 500 MG PO TABS: 500.0000 mg | ORAL_TABLET | Freq: Four times a day (QID) | ORAL | Status: DC | PRN

## 2015-04-10 NOTE — ED Notes (Signed)
Pt states she started having R leg pain last week, steadily gotten worse. Pt gets worse with ambulation. Pt states shes tried icy hot and rubbing alcohol on it with no relief as well as ibuprofen. Pain is in back of R upper and lower leg. No swelling or bruising noted. Pt denies any injury to leg. VSS, hx of HTN.

## 2015-04-10 NOTE — Progress Notes (Addendum)
*  PRELIMINARY RESULTS* Vascular Ultrasound Right lower extremity venous duplex has been completed.  Preliminary findings: No evidence of DVT or baker's cyst.   Farrel Demark, RDMS, RVT  04/10/2015, 7:02 PM

## 2015-04-10 NOTE — ED Provider Notes (Signed)
CSN: [id number]     Arrival date & time 04/10/15  1524 History  By signing my name below, I, Phillis Haggis, attest that this documentation has been prepared under the direction and in the presence of Jozie Wulf, PA-C. Electronically Signed: Phillis Haggis, ED Scribe. 04/10/2015. 5:41 PM.   Chief Complaint  Patient presents with  . Leg Pain   The history is provided by the patient. No language interpreter was used.  HPI Comments: Shelley Allison is a 60 y.o. Female with a hx of HTN and cerebral aneurysm  who presents to the Emergency Department complaining of burning, cramping, intermittent right leg pain onset one week ago. Pt states that the pain radiates to the front and back of her leg from right lateral thigh to the mid calf. She states that the pain first began as an itch while she was at work last Tuesday and has progressed to a burning, cramping pain. She reports 1 hour episodes, about 2-3 times per day and worsens at night. She says that she is unable to sleep due to pain and is unable to get comfortable. 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 She has used IcyHot, ibuprofen, and rubbing alcohol on the leg to mild relief. Pt reports maternal hx of blood clot in the lungs. She does not know of any injuries or fall recently that could have caused her pain. She denies worsening or alleviating factors, hx of similar symptoms, recent long travel, recent surgeries or hospitalization, use of medications with estrogen, fever, chills, chest pain, SOB, back pain, abdominal pain, leg swelling, numbness, weakness, bladder or bowel incontinence. Pt takes her HTN medications daily.     Past Medical History  Diagnosis Date  . Cerebral aneurysm rupture (HCC) 1999  . Hypertension    Past Surgical History  Procedure Laterality Date  . Brain surgery    . Cesarean section    . Hernia repair    . Cholecystectomy N/A 04/07/2014    Procedure: LAPAROSCOPIC CHOLECYSTECTOMY;  Surgeon: Frederik Schmidt, MD;  Location: [facility] OR;  Service: General;  Laterality: N/A;   Family History  Problem Relation Age of Onset  . Arthritis Mother    Social History  Substance Use Topics  . Smoking status: Former Smoker    Quit date: 12/18/1996  . Smokeless tobacco: Never Used  . Alcohol Use: No     Comment: occasional   OB History    No data available     Review of Systems  Constitutional: Negative for fever, chills, activity change and appetite change.  Respiratory: Negative for shortness of breath.   Cardiovascular: Negative for chest pain and leg swelling.  Gastrointestinal: Negative for abdominal pain.  Musculoskeletal: Negative for back pain.  Skin: Negative for color change, rash and wound.  Allergic/Immunologic: Negative for immunocompromised state.  Neurological: Negative for numbness.  Hematological: Does not bruise/bleed easily.  Psychiatric/Behavioral: Negative for self-injury.    Allergies  Fish allergy; Other; Peanuts; and Shellfish allergy  Home Medications   Prior to Admission medications   Medication Sig Start Date End Date Taking? Authorizing Provider  bismuth subsalicylate (PEPTO BISMOL) 262 MG/15ML suspension Take 30 mLs by mouth every 6 (six) hours as needed.    Historical Provider, MD  Calcium Carbonate-Vitamin D (CALTRATE 600+D) 600-400 MG-UNIT per tablet Take 1 tablet by mouth 2 (two) times daily.      Historical Provider, MD  diltiazem (CARDIZEM CD) 240 MG 24 hr capsule  take 1 capsule by mouth once daily 02/02/15   Erasmo Downer, MD  EPINEPHrine (EPIPEN) 0.3 mg/0.3 mL DEVI Inject 0.3 mg into the muscle as needed. for anaphylaxis     Historical Provider, MD  naproxen sodium (ANAPROX) 220 MG tablet Take 220 mg by mouth 2 (two) times daily with a meal.    Historical Provider, MD  oxyCODONE-acetaminophen (PERCOCET/ROXICET) 5-325 MG per tablet Take 1-2 tablets by mouth every 6 (six) hours as needed for moderate pain. 04/08/14   Nonie Hoyer, PA-C   triamterene-hydrochlorothiazide (MAXZIDE) 75-50 MG per tablet Take 1 tablet by mouth daily. 04/25/14   Erasmo Downer, MD   BP 181/91 mmHg  Pulse 61  Temp(Src) 98.5 F (36.9 C) (Oral)  Resp 20  SpO2 98% Physical Exam  Constitutional: She appears well-developed and well-nourished. No distress.  HENT:  Head: Normocephalic and atraumatic.  Neck: Neck supple.  Pulmonary/Chest: Effort normal.  Abdominal: Soft. She exhibits no distension and no mass. There is no tenderness. There is no rebound and no guarding.  Musculoskeletal:  Spine nontender, no crepitus, or stepoffs. Lower extremities:  Strength 5/5, sensation intact, distal pulses intact.  Tenderness to the posterior right thigh and right calf without notable edema or skin changes.   No erythema.   Straight leg raise reproduces pain.    Neurological: She is alert.  Skin: She is not diaphoretic.  Nursing note and vitals reviewed.   ED Course  Procedures (including critical care time) DIAGNOSTIC STUDIES: Oxygen Saturation is 98% on RA, normal by my interpretation.    COORDINATION OF CARE: 4:24 PM-Discussed treatment plan which includes [place] of leg and x-ray with pt at bedside and pt agreed to plan.    Labs Review Labs Reviewed - No data to display  Imaging Review Dg Lumbar Spine Complete  04/10/2015  CLINICAL DATA:  Right lower extremity radiating pain for 1 week. No reported injury. EXAM: LUMBAR SPINE - COMPLETE 4+ VIEW COMPARISON:  None. FINDINGS: This report assumes 5 non rib-bearing lumbar vertebrae. Lumbar vertebral body heights are preserved, with no fracture. There is severe degenerative disc disease at L4-5. Remaining lumbar disc heights appear preserved. There is 7 mm anterolisthesis at L4-5. There is facet arthropathy bilaterally in the lower lumbar spine. There is mild degenerative foraminal stenosis bilaterally at L4-5. No aggressive appearing focal osseous lesions. Cholecystectomy clips are seen in the right  upper quadrant of the abdomen. IMPRESSION: Severe degenerative disc disease and 7 mm anterolisthesis at L4-5. Degenerative foraminal stenosis bilaterally at L4-5. Electronically Signed   By: Delbert Phenix M.D.   On: 04/10/2015 17:36     EKG Interpretation None       Vascular duplex US of RLE is negative per verbal report, written report has not crossed over.    MDM   Final diagnoses:  DDD (degenerative disc disease), lumbar  Lumbar radiculopathy, acute   Afebrile, nontoxic patient with right leg pain without weakness or back pain.  No e/o septic joints.  DVT study is negative.  Lumbar film reveals significant DDD, foraminal stenosis, possibly allowing for radicular symptoms.  D/C home with prednisone, robaxin, mobic, PCP, neurosurgery follow up.  Discussed result, findings, treatment, and follow up  with patient.  Pt given return precautions.  Pt verbalizes understanding and agrees with plan.        I personally performed the services described in this documentation, which was scribed in my presence. The recorded information has been reviewed and is accurate.  Trixie Dredge, PA-C 04/10/15 1917  Tilden Fossa, MD 04/13/15 1316

## 2015-04-10 NOTE — Discharge Instructions (Signed)
Read the information below.  Use the prescribed medication as directed.  Please discuss all new medications with your pharmacist.  You may return to the Emergency Department at any time for worsening condition or any new symptoms that concern you.     If you develop fevers, loss of control of bowel or bladder, weakness or numbness in your legs, or are unable to walk, return to the ER for a recheck.    Lumbosacral Radiculopathy Lumbosacral radiculopathy is a condition that involves the spinal nerves and nerve roots in the low back and bottom of the spine. The condition develops when these nerves and nerve roots move out of place or become inflamed and cause symptoms. CAUSES This condition may be caused by:  Pressure from a disk that bulges out of place (herniated disk). A disk is a plate of cartilage that separates bones in the spine.  Disk degeneration.  A narrowing of the bones of the lower back (spinal stenosis).  A tumor.  An infection.  An injury that places sudden pressure on the disks that cushion the bones of your lower spine. RISK FACTORS This condition is more likely to develop in:  Males aged 30-50 years.  Females aged 50-60 years.  People who lift improperly.  People who are overweight or live a sedentary lifestyle.  People who smoke.  People who perform repetitive activities that strain the spine. SYMPTOMS Symptoms of this condition include:  Pain that goes down from the back into the legs (sciatica). This is the most common symptom. The pain may be worse with sitting, coughing, or sneezing.  Pain and numbness in the arms and legs.  Muscle weakness.  Tingling.  Loss of bladder control or bowel control. DIAGNOSIS This condition is diagnosed with a physical exam and medical history. If the pain is lasting, you may have tests, such as:  MRI scan.  X-ray.  CT scan.  Myelogram.  Nerve conduction study. TREATMENT This condition is often treated  with:  Hot packs and ice applied to affected areas.  Stretches to improve flexibility.  Exercises to strengthen back muscles.  Physical therapy.  Pain medicine.  A steroid injection in the spine. In some cases, no treatment is needed. If the condition is long-lasting (chronic), or if symptoms are severe, treatment may involve surgery or lifestyle changes, such as following a weight loss plan. HOME CARE INSTRUCTIONS Medicines  Take medicines only as directed by your health care provider.  Do not drive or operate heavy machinery while taking pain medicine. Injury Care  Apply a heat pack to the injured area as directed by your health care provider.  Apply ice to the affected area:  Put ice in a plastic bag.  Place a towel between your skin and the bag.  Leave the ice on for 20-30 minutes, every 2 hours while you are awake or as needed. Or, leave the ice on for as long as directed by your health care provider. Other Instructions  If you were shown how to do any exercises or stretches, do them as directed by your health care provider.  If your health care provider prescribed a diet or exercise program, follow it as directed.  Keep all follow-up visits as directed by your health care provider. This is important. SEEK MEDICAL CARE IF:  Your pain does not improve over time even when taking pain medicines. SEEK IMMEDIATE MEDICAL CARE IF:  Your develop severe pain.  Your pain suddenly gets worse.  You develop increasing weakness  in your legs.  You lose the ability to control your bladder or bowel.  You have difficulty walking or balancing.  You have a fever.   This information is not intended to replace advice given to you by your health care provider. Make sure you discuss any questions you have with your health care provider.   Document Released: 01/31/2005 Document Revised: 06/17/2014 Document Reviewed: 01/27/2014 Elsevier Interactive Patient Education 2016  [employer]  Degenerative Disk Disease Degenerative disk disease is a condition caused by the changes that occur in spinal disks as you grow older. Spinal disks are soft and compressible disks located between the bones of your spine (vertebrae). These disks act like shock absorbers. Degenerative disk disease can affect the whole spine. However, the neck and lower back are most commonly affected. Many changes can occur in the spinal disks with aging, such as:  The spinal disks may dry and shrink.  Small tears may occur in the tough, outer covering of the disk (annulus).  The disk space may become smaller due to loss of water.  Abnormal growths in the bone (spurs) may occur. This can put pressure on the nerve roots exiting the spinal canal, causing pain.  The spinal canal may become narrowed. RISK FACTORS   Being overweight.  Having a family history of degenerative disk disease.  Smoking.  There is increased risk if you are doing heavy lifting or have a sudden injury. SIGNS AND SYMPTOMS  Symptoms vary from person to person and may include:  Pain that varies in intensity. Some people have no pain, while others have severe pain. The location of the pain depends on the part of your backbone that is affected.  You will have neck or arm pain if a disk in the neck area is affected.  You will have pain in your back, buttocks, or legs if a disk in the lower back is affected.  Pain that becomes worse while bending, reaching up, or with twisting movements.  Pain that may start gradually and then get worse as time passes. It may also start after a major or minor injury.  Numbness or tingling in the arms or legs. DIAGNOSIS  Your health care provider will ask you about your symptoms and about activities or habits that may cause the pain. He or she may also ask about any injuries, diseases, or treatments you have had. Your health care provider will examine you to check for the range of  movement that is possible in the affected area, to check for strength in your extremities, and to check for sensation in the areas of the arms and legs supplied by different nerve roots. You may also have:   An X-ray of the spine.  Other imaging tests, such as MRI. TREATMENT  Your health care provider will advise you on the best plan for treatment. Treatment may include:  Medicines.  Rehabilitation exercises. HOME CARE INSTRUCTIONS   Follow proper lifting and walking techniques as advised by your health care provider.  Maintain good posture.  Exercise regularly as advised by your health care provider.  Perform relaxation exercises.  Change your sitting, standing, and sleeping habits as advised by your health care provider.  Change positions frequently.  Lose weight or maintain a healthy weight as advised by your health care provider.  Do not use any tobacco products, including cigarettes, chewing tobacco, or electronic cigarettes. If you need help quitting, ask your health care provider.  Wear supportive footwear.  Take medicines only  as directed by your health care provider. SEEK MEDICAL CARE IF:   Your pain does not go away within 1-4 weeks.  You have significant appetite or weight loss. SEEK IMMEDIATE MEDICAL CARE IF:   Your pain is severe.  You notice weakness in your arms, hands, or legs.  You begin to lose control of your bladder or bowel movements.  You have fevers or night sweats. MAKE SURE YOU:   Understand these instructions.  Will watch your condition.  Will get help right away if you are not doing well or get worse.   This information is not intended to replace advice given to you by your health care provider. Make sure you discuss any questions you have with your health care provider.   Document Released: 11/28/2006 Document Revised: 02/21/2014 Document Reviewed: 06/04/2013 Elsevier Interactive Patient Education [employer]

## 2015-04-10 NOTE — ED Notes (Signed)
Pt.s acuity level changed to a 3 per Trixie Dredge, NP

## 2015-04-15 ENCOUNTER — Ambulatory Visit (INDEPENDENT_AMBULATORY_CARE_PROVIDER_SITE_OTHER): Payer: BC Managed Care – PPO | Admitting: Family Medicine

## 2015-04-15 ENCOUNTER — Emergency Department (HOSPITAL_COMMUNITY): Payer: BC Managed Care – PPO

## 2015-04-15 ENCOUNTER — Other Ambulatory Visit: Payer: Self-pay

## 2015-04-15 ENCOUNTER — Encounter (HOSPITAL_COMMUNITY): Payer: Self-pay | Admitting: *Deleted

## 2015-04-15 ENCOUNTER — Emergency Department (HOSPITAL_COMMUNITY)
Admission: EM | Admit: 2015-04-15 | Discharge: 2015-04-15 | Disposition: A | Discharge status: Home / Self Care | Payer: BC Managed Care – PPO | Attending: Physician Assistant | Admitting: Physician Assistant

## 2015-04-15 ENCOUNTER — Encounter: Payer: Self-pay | Admitting: Family Medicine

## 2015-04-15 VITALS — BP 222/108 | Temp 98.2°F | Ht 65.0 in | Wt 201.0 lb

## 2015-04-15 DIAGNOSIS — Z79899 Other long term (current) drug therapy: Secondary | ICD-10-CM | POA: Insufficient documentation

## 2015-04-15 DIAGNOSIS — I159 Secondary hypertension, unspecified: Secondary | ICD-10-CM | POA: Diagnosis not present

## 2015-04-15 DIAGNOSIS — M4727 Other spondylosis with radiculopathy, lumbosacral region: Secondary | ICD-10-CM

## 2015-04-15 DIAGNOSIS — M549 Dorsalgia, unspecified: Secondary | ICD-10-CM | POA: Insufficient documentation

## 2015-04-15 DIAGNOSIS — Z87891 Personal history of nicotine dependence: Secondary | ICD-10-CM | POA: Diagnosis not present

## 2015-04-15 DIAGNOSIS — R51 Headache: Secondary | ICD-10-CM | POA: Diagnosis not present

## 2015-04-15 DIAGNOSIS — I10 Essential (primary) hypertension: Secondary | ICD-10-CM | POA: Diagnosis present

## 2015-04-15 DIAGNOSIS — M1990 Unspecified osteoarthritis, unspecified site: Secondary | ICD-10-CM | POA: Insufficient documentation

## 2015-04-15 LAB — COMPREHENSIVE METABOLIC PANEL
ALK PHOS: 71 U/L (ref 38–126)
ALT: 17 U/L (ref 14–54)
ANION GAP: 9 (ref 5–15)
AST: 14 U/L — ABNORMAL LOW (ref 15–41)
Albumin: 3.2 g/dL — ABNORMAL LOW (ref 3.5–5.0)
BUN: 10 mg/dL (ref 6–20)
CHLORIDE: 107 mmol/L (ref 101–111)
CO2: 25 mmol/L (ref 22–32)
Calcium: 8.9 mg/dL (ref 8.9–10.3)
Creatinine, Ser: 0.76 mg/dL (ref 0.44–1.00)
Glucose, Bld: 85 mg/dL (ref 65–99)
POTASSIUM: 4 mmol/L (ref 3.5–5.1)
Sodium: 141 mmol/L (ref 135–145)
TOTAL PROTEIN: 6.6 g/dL (ref 6.5–8.1)
Total Bilirubin: 0.6 mg/dL (ref 0.3–1.2)

## 2015-04-15 LAB — I-STAT TROPONIN, ED: Troponin i, poc: 0 ng/mL (ref 0.00–0.08)

## 2015-04-15 LAB — CBC WITH DIFFERENTIAL/PLATELET
BASOS ABS: 0 10*3/uL (ref 0.0–0.1)
Basophils Relative: 0 %
EOS PCT: 6 %
Eosinophils Absolute: 0.3 10*3/uL (ref 0.0–0.7)
HCT: 41.7 % (ref 36.0–46.0)
HEMOGLOBIN: 12.9 g/dL (ref 12.0–15.0)
LYMPHS PCT: 36 %
Lymphs Abs: 1.8 10*3/uL (ref 0.7–4.0)
MCH: 27 pg (ref 26.0–34.0)
MCHC: 30.9 g/dL (ref 30.0–36.0)
MCV: 87.4 fL (ref 78.0–100.0)
Monocytes Absolute: 0.7 10*3/uL (ref 0.1–1.0)
Monocytes Relative: 15 %
NEUTROS ABS: 2.2 10*3/uL (ref 1.7–7.7)
NEUTROS PCT: 43 %
PLATELETS: 248 10*3/uL (ref 150–400)
RBC: 4.77 MIL/uL (ref 3.87–5.11)
RDW: 13.9 % (ref 11.5–15.5)
WBC: 5.1 10*3/uL (ref 4.0–10.5)

## 2015-04-15 MED ORDER — MELOXICAM 7.5 MG PO TABS: 7.5000 mg | ORAL_TABLET | Freq: Every day | ORAL | Status: DC

## 2015-04-15 MED ORDER — METHOCARBAMOL 500 MG PO TABS: 500.0000 mg | ORAL_TABLET | Freq: Three times a day (TID) | ORAL | Status: DC | PRN

## 2015-04-15 MED ORDER — CLONIDINE HCL 0.1 MG PO TABS
0.2000 mg | ORAL_TABLET | Freq: Once | ORAL | Status: AC
  Administered 2015-04-15: 0.2 mg via ORAL

## 2015-04-15 NOTE — ED Notes (Signed)
Pt. Ambulatory to RM A9 with no complaints of dizziness and steady gait.

## 2015-04-15 NOTE — Assessment & Plan Note (Signed)
Noted to be elevated on initial and on repeat to 220s over 110 Patient is currently asymptomatic Patient given clonidine 0.2 mg by mouth in clinic Due to the risk of stroke especially in light of history of cerebral aneurysm, patient sent to the emergency department for hypertensiv urgency workup and likely admission to the hospital Suspect this is elevated related to high doses of prednisone that were taken in the last several days

## 2015-04-15 NOTE — Discharge Instructions (Signed)
You were seen today for hypertension. We talked to your primary care physician. Labs and CAT scan were normal. Please take medications as prescribed by her primary care physician as planned. Please stop the prednisone as discussed with uyour primary care physician.    Hypertension Hypertension is another name for high blood pressure. High blood pressure forces your heart to work harder to pump blood. A blood pressure reading has two numbers, which includes a higher number over a lower number (example: 110/72). HOME CARE   Have your blood pressure rechecked by your doctor.  Only take medicine as told by your doctor. Follow the directions carefully. The medicine does not work as well if you skip doses. Skipping doses also puts you at risk for problems.  Do not smoke.  Monitor your blood pressure at home as told by your doctor. GET HELP IF:  You think you are having a reaction to the medicine you are taking.  You have repeat headaches or feel dizzy.  You have puffiness (swelling) in your ankles.  You have trouble with your vision. GET HELP RIGHT AWAY IF:   You get a very bad headache and are confused.  You feel weak, numb, or faint.  You get chest or belly (abdominal) pain.  You throw up (vomit).  You cannot breathe very well. MAKE SURE YOU:   Understand these instructions.  Will watch your condition.  Will get help right away if you are not doing well or get worse.   This information is not intended to replace advice given to you by your health care provider. Make sure you discuss any questions you have with your health care provider.   Document Released: 07/20/2007 Document Revised: 02/05/2013 Document Reviewed: 11/23/2012 Elsevier Interactive Patient Education [employer]

## 2015-04-15 NOTE — Assessment & Plan Note (Signed)
Pain is likely radicular pain Stop prednisone as patient is taken higher doses than necessary Continue Modic daily for one month Continue Robaxin as needed for 1 month Follow-up in one month and consider physical therapy at that time Advised patient that I would not recommend a neurosurgery consult at this time as she is neurologically intact Return precautions given

## 2015-04-15 NOTE — ED Notes (Signed)
Pt went to pcp today as followup from her visit here on 2/24 for back pain. Pt was sent here due to HTN, bp is 180/114 at triage.

## 2015-04-15 NOTE — Progress Notes (Signed)
   Subjective:   Shelley Allison is a 60 y.o. female with a history of HTN, cerebral aneurysm, obesity here for ER follow-up for right leg pain  Leg pain: Seen in ED for R lateral thigh pain that radiates to calf XRays showed DDD No weakness or numbness Patient still doing all ADLs and working - just goes slower because of pain Took prednisone  TID and then  TID, etc - taking 0.5mg  tablet today Also taking Robaxin BID when not working and qhs when working Taking mobic daily Pain is throbbing, tingling, intermittent pain Nothing seems to make it better or worse No incontinence, fevers, back pain  HTN: - Medications: Maxzide - Compliance: Good, took it this morning - Checking BP at home: No - Denies any SOB, CP, vision changes, LE edema, medication SEs, or symptoms of hypotension   Review of Systems:  Per HPI.   Social History: former smoker  Objective:  BP 222/108 mmHg  Temp(Src) 98.2 F (36.8 C) (Oral)  Ht  (1.651 m)  Wt 201 lb (91.173 kg)  BMI 33.45 kg/m2  Gen:  60 y.o. female in NAD HEENT: NCAT, MMM, EOMI, PERRL, anicteric sclerae CV: RRR, no MRG Resp: Non-labored, CTAB, no wheezes noted Ext: WWP, no edema MSK: R leg: Full ROM, strength intact, sensation intact, no TTP Neuro: Alert and oriented, speech normal     Assessment & Plan:     Shelley Allison is a 60 y.o. female here for Right leg radicular pain and noted to have hypertensive urgency on the office  Degenerative joint disease Pain is likely radicular pain Stop prednisone as patient is taken higher doses than necessary Continue Modic daily for one month Continue Robaxin as needed for 1 month Follow-up in one month and consider physical therapy at that time Advised patient that I would not recommend a neurosurgery consult at this time as she is neurologically intact Return precautions given  HYPERTENSION, BENIGN SYSTEMIC Noted to be elevated on initial and on repeat to 220s over  110 Patient is currently asymptomatic Patient given clonidine 0.2 mg by mouth in clinic Due to the risk of stroke especially in light of history of cerebral aneurysm, patient sent to the emergency department for hypertensiv urgency workup and likely admission to the hospital Suspect this is elevated related to high doses of prednisone that were taken in the last several days       Erasmo Downer, MD MPH PGY-2,  [facility] 04/15/2015  12:09 PM

## 2015-04-15 NOTE — ED Provider Notes (Signed)
CSN: [id number]     Arrival date & time 04/15/15  1049 History   First MD Initiated Contact with Patient 04/15/15 1346     Chief Complaint  Patient presents with  . Back Pain  . Hypertension     (Consider location/radiation/quality/duration/timing/severity/associated sxs/prior Treatment) HPI   Patient is a 60 year old female with past mental history significant for hypertension, cerebral aneurysm in 1998. Patient seen by PCP this morning. Patient was following up with her primary care physician for back pain that she was treated for recently in the emergency department. Found to be hypertensive. Asymptomatic. Sent here for evaluation.  She has been taking her prednisone incorrectly, 3 times a day.  Past Medical History  Diagnosis Date  . Cerebral aneurysm rupture (HCC) 1999  . Hypertension    Past Surgical History  Procedure Laterality Date  . Brain surgery    . Cesarean section    . Hernia repair    . Cholecystectomy N/A 04/07/2014    Procedure: LAPAROSCOPIC CHOLECYSTECTOMY;  Surgeon: Frederik Schmidt, MD;  Location: [facility] OR;  Service: General;  Laterality: N/A;   Family History  Problem Relation Age of Onset  . Arthritis Mother    Social History  Substance Use Topics  . Smoking status: Former Smoker    Quit date: 12/18/1996  . Smokeless tobacco: Never Used  . Alcohol Use: No     Comment: occasional   OB History    No data available     Review of Systems  Constitutional: Negative for activity change.  Respiratory: Negative for shortness of breath.   Cardiovascular: Negative for chest pain.  Gastrointestinal: Negative for abdominal pain.  Genitourinary: Negative for dysuria.  Neurological: Negative for seizures, syncope, speech difficulty, weakness and headaches.  Psychiatric/Behavioral: Negative for agitation.      Allergies  Fish allergy; Other; Peanuts; and Shellfish allergy  Home Medications   Prior to Admission medications   Medication Sig Start Date End Date  Taking? Authorizing Provider  diltiazem (CARDIZEM CD) 240 MG 24 hr capsule take 1 capsule by mouth once daily 02/02/15  Yes Erasmo Downer, MD  EPINEPHrine (EPIPEN) 0.3 mg/0.3 mL DEVI Inject 0.3 mg into the muscle as needed. for anaphylaxis    Yes Historical Provider, MD  ibuprofen (ADVIL,MOTRIN) 200 MG tablet Take 200 mg by mouth every 6 (six) hours as needed for moderate pain.   Yes Historical Provider, MD  meloxicam (MOBIC) 7.5 MG tablet Take 1 tablet (7.5 mg total) by mouth daily. 04/15/15  Yes Erasmo Downer, MD  methocarbamol (ROBAXIN) 500 MG tablet Take 1-2 tablets (500-1,000 mg total) by mouth every 8 (eight) hours as needed for muscle spasms (and pain). Patient taking differently: Take 500-1,000 mg by mouth every 6 (six) hours as needed for muscle spasms (and pain).  04/15/15  Yes Erasmo Downer, MD  oxyCODONE-acetaminophen (PERCOCET/ROXICET) 5-325 MG per tablet Take 1-2 tablets by mouth every 6 (six) hours as needed for moderate pain. 04/08/14  Yes Nonie Hoyer, PA-C  predniSONE (DELTASONE) 20 MG tablet Take 1 tablet by mouth taper from 4 doses each day to 1 dose and stop. 04/11/15  Yes Historical Provider, MD  triamterene-hydrochlorothiazide (MAXZIDE) 75-50 MG per tablet Take 1 tablet by mouth daily. 04/25/14  Yes Erasmo Downer, MD   BP 174/91 mmHg  Pulse 56  Temp(Src) 98.2 F (36.8 C) (Oral)  Resp 16  SpO2 99% Physical Exam  Constitutional: She is oriented to person, place, and time. She appears well-developed and well-nourished.  HENT:  Head: Normocephalic and atraumatic.  Eyes: Conjunctivae are normal. Right eye exhibits no discharge.  Neck: Neck supple.  Cardiovascular: Normal rate, regular rhythm and normal heart sounds.   No murmur heard. Pulmonary/Chest: Effort normal and breath sounds normal. She has no wheezes. She has no rales.  Abdominal: Soft. She exhibits no distension. There is no tenderness.  Musculoskeletal: Normal range of motion. She exhibits no  edema.  Neurological: She is oriented to person, place, and time. No cranial nerve deficit.  Skin: Skin is warm and dry. No rash noted. She is not diaphoretic.  Psychiatric: She has a normal mood and affect. Her behavior is normal.  Nursing note and vitals reviewed.   ED Course  Procedures (including critical care time) Labs Review Labs Reviewed  COMPREHENSIVE METABOLIC PANEL  CBC WITH DIFFERENTIAL/PLATELET  Rosezena Sensor, ED    Imaging Review Ct Head Wo Contrast  04/15/2015  CLINICAL DATA:  Fall, leg weakness, headache, hypertension. History of cerebral aneurysm with rupture. EXAM: CT HEAD WITHOUT CONTRAST TECHNIQUE: Contiguous axial images were obtained from the base of the skull through the vertex without intravenous contrast. COMPARISON:  None. FINDINGS: No evidence of parenchymal hemorrhage or extra-axial fluid collection. No mass lesion, mass effect, or midline shift. No CT evidence of acute infarction. Status post left frontotemporal craniotomy with left-sided aneurysm clipping with associated encephalomalacic changes along the anterior aspect of the left temporal lobe. Cerebral volume is within normal limits.  No ventriculomegaly. The visualized paranasal sinuses are essentially clear. The mastoid air cells are unopacified. No evidence of calvarial fracture. IMPRESSION: Postsurgical changes related to left-sided aneurysm clipping. No evidence of acute intracranial abnormality. Electronically Signed   By: Charline Bills M.D.   On: 04/15/2015 15:06   I have personally reviewed and evaluated these images and lab results as part of my medical decision-making.   EKG Interpretation   Date/Time:  Wednesday April 15 2015 14:45:45 EST Ventricular Rate:  55 PR Interval:  172 QRS Duration: 94 QT Interval:  430 QTC Calculation: 411 R Axis:   2 Text Interpretation:  Sinus rhythm Probable anterior infarct, age  indeterminate Baseline wander in lead(s) V4 no specifc t wave changes in   lateral leads Confirmed by Kandis Mannan ([id number]) on 04/15/2015 3:49:59  PM      MDM   Final diagnoses:  None    Patient is a 60 year old female with history of hypertension cerebral aneurysm in 1998. Patient is been sent in here by her primary care physician for asymptomatic hypertension. Patient went to the office visit today for follow-up without back pain. Her blood pressure is noted to be 220/120. Patient had no complaints. No headaches no numbness no neurologic changes. No chest pain. No difficulty breathing or urinating. No SOB.   Discussed with the patient's PCP who would like labs drawn. If normal, patient's PCP, feels comfortable following her up as an outpatient.      Courteney Randall An, MD 04/15/15 6045

## 2015-04-15 NOTE — Patient Instructions (Signed)
Radicular Pain Radicular pain in either the arm or leg is usually from a bulging or herniated disk in the spine. A piece of the herniated disk may press against the nerves as the nerves exit the spine. This causes pain which is felt at the tips of the nerves down the arm or leg. Other causes of radicular pain may include:  Fractures.  Heart disease.  Cancer.  An abnormal and usually degenerative state of the nervous system or nerves (neuropathy). Nerves that start at the neck (nerve roots) may cause radicular pain in the outer shoulder and arm. It can spread down to the thumb and fingers. The symptoms vary depending on which nerve root has been affected. In most cases radicular pain improves with conservative treatment. Neck problems may require physical therapy, a neck collar, or cervical traction. Treatment may take many weeks, and surgery may be considered if the symptoms do not improve.  Conservative treatment is also recommended for sciatica. Sciatica causes pain to radiate from the lower back or buttock area down the leg into the foot. Often there is a history of back problems. Most patients with sciatica are better after 2 to 4 weeks of rest and other supportive care. Short term bed rest can reduce the disk pressure considerably. Sitting, however, is not a good position since this increases the pressure on the disk. You should avoid bending, lifting, and all other activities which make the problem worse. Traction can be used in severe cases. Surgery is usually reserved for patients who do not improve within the first months of treatment. Only take over-the-counter or prescription medicines for pain, discomfort, or fever as directed by your caregiver. Narcotics and muscle relaxants may help by relieving more severe pain and spasm and by providing mild sedation. Cold or massage can give significant relief. Spinal manipulation is not recommended. It can increase the degree of disc protrusion.  Epidural steroid injections are often effective treatment for radicular pain. These injections deliver medicine to the spinal nerve in the space between the protective covering of the spinal cord and back bones (vertebrae). Your caregiver can give you more information about steroid injections. These injections are most effective when given within two weeks of the onset of pain.  You should see your caregiver for follow up care as recommended. A program for neck and back injury rehabilitation with stretching and strengthening exercises is an important part of management.  SEEK IMMEDIATE MEDICAL CARE IF:  You develop increased pain, weakness, or numbness in your arm or leg.  You develop difficulty with bladder or bowel control.  You develop abdominal pain.   This information is not intended to replace advice given to you by your health care provider. Make sure you discuss any questions you have with your health care provider.   Document Released: 03/10/2004 Document Revised: 02/21/2014 Document Reviewed: 08/27/2014 Elsevier Interactive Patient Education [employer]

## 2015-04-16 ENCOUNTER — Telehealth: Payer: Self-pay | Admitting: Family Medicine

## 2015-04-16 NOTE — Telephone Encounter (Signed)
Appointment scheduled with PCP on 3/8.

## 2015-04-16 NOTE — Telephone Encounter (Signed)
Called patient to discuss recent ED visit and elevated BP.  Patient was not available and message was left for her to call back to [facility].  If patient returns call, please have her schedule f/u appt early next week for her BP. Thanks!  Erasmo Downer, MD, MPH PGY-2,  [facility] 04/16/2015 10:10 AM

## 2015-04-22 ENCOUNTER — Encounter: Payer: Self-pay | Admitting: Family Medicine

## 2015-04-22 ENCOUNTER — Ambulatory Visit (INDEPENDENT_AMBULATORY_CARE_PROVIDER_SITE_OTHER): Payer: BC Managed Care – PPO | Admitting: Family Medicine

## 2015-04-22 VITALS — BP 157/79 | HR 52 | Temp 98.5°F | Ht 65.0 in | Wt 198.0 lb

## 2015-04-22 DIAGNOSIS — I10 Essential (primary) hypertension: Secondary | ICD-10-CM

## 2015-04-22 MED ORDER — HYDROCHLOROTHIAZIDE 25 MG PO TABS: 25.0000 mg | ORAL_TABLET | Freq: Every day | ORAL | Status: DC

## 2015-04-22 MED ORDER — LISINOPRIL 10 MG PO TABS: 10.0000 mg | ORAL_TABLET | Freq: Every day | ORAL | Status: DC

## 2015-04-22 MED ORDER — AMLODIPINE BESYLATE 5 MG PO TABS: 5.0000 mg | ORAL_TABLET | Freq: Every day | ORAL | Status: DC

## 2015-04-22 NOTE — Progress Notes (Signed)
   Subjective:   Shelley Allison is a 60 y.o. female with a history of HTN, cerebral aneurysm here for BP f/u.  HTN: - Medications: diltiazem 240mg  daily, maxzide 75-50mg  daily - Compliance: good - Checking BP at home: no - Denies any SOB, CP, vision changes, LE edema, medication SEs, or symptoms of hypotension - Diet: doesn't eat breakfast, doesn't eat a lot of fried foods, bakes most meals, not a lot of junk food - Exercise: walks a lot at work - Was sent to the ED last week from clinic given consistent blood pressure elevation to 220/120   Review of Systems:  Per HPI.   Social History: former smoker  Objective:  BP 157/79 mmHg  Pulse 52  Temp(Src) 98.5 F (36.9 C) (Oral)  Ht 5\' 5"  (1.651 m)  Wt 198 lb (89.812 kg)  BMI 32.95 kg/m2  Gen:  60 y.o. female in NAD HEENT: NCAT, MMM, EOMI, PERRL, anicteric sclerae CV: RRR, no MRG Resp: Non-labored, CTAB, no wheezes noted Ext: WWP, trace non-pitting edema MSK: No obvious deformities Neuro: Alert and oriented, speech normal     Assessment & Plan:     Shelley Allison is a 60 y.o. female here for HTN f/u  HYPERTENSION, BENIGN SYSTEMIC BP remains elevated above goal today Suspect last weeks accelerated hypertension related to improper taking of prednisone Will discontinue diltiazem given heart rate of 52 Start amlodipine 5 mg daily instead DC Maxide and start lisinopril 10 mg daily and HCTZ 25 mg daily instead Follow-up in 2 weeks and get BMP at that time       Erasmo DownerAngela M Bacigalupo, MD MPH PGY-2,  [facility] 04/22/2015  2:54 PM

## 2015-04-22 NOTE — Assessment & Plan Note (Signed)
BP remains elevated above goal today Suspect last weeks accelerated hypertension related to improper taking of prednisone Will discontinue diltiazem given heart rate of 52 Start amlodipine 5 mg daily instead DC Maxide and start lisinopril 10 mg daily and HCTZ 25 mg daily instead Follow-up in 2 weeks and get BMP at that time

## 2015-04-22 NOTE — Patient Instructions (Signed)
Nice to see you again today.  Stop Maxzide and diltiazem.  Start amlodipine 5mg  daily, HCTZ 25mg  daily, and lisinopril 10mg  daily.  See me again in 2 weeks.  We will recheck blood pressure and check labs at that visit.  Take care, Dr. B  Back Pain, Adult Back pain is very common in adults.The cause of back pain is rarely dangerous and the pain often gets better over time.The cause of your back pain may not be known. Some common causes of back pain include:  Strain of the muscles or ligaments supporting the spine.  Wear and tear (degeneration) of the spinal disks.  Arthritis.  Direct injury to the back. For many people, back pain may return. Since back pain is rarely dangerous, most people can learn to manage this condition on their own. HOME CARE INSTRUCTIONS Watch your back pain for any changes. The following actions may help to lessen any discomfort you are feeling:  Remain active. It is stressful on your back to sit or stand in one place for long periods of time. Do not sit, drive, or stand in one place for more than 30 minutes at a time. Take short walks on even surfaces as soon as you are able.Try to increase the length of time you walk each day.  Exercise regularly as directed by your health care provider. Exercise helps your back heal faster. It also helps avoid future injury by keeping your muscles strong and flexible.  Do not stay in bed.Resting more than 1-2 days can delay your recovery.  Pay attention to your body when you bend and lift. The most comfortable positions are those that put less stress on your recovering back. Always use proper lifting techniques, including:  Bending your knees.  Keeping the load close to your body.  Avoiding twisting.  Find a comfortable position to sleep. Use a firm mattress and lie on your side with your knees slightly bent. If you lie on your back, put a pillow under your knees.  Avoid feeling anxious or stressed.Stress increases  muscle tension and can worsen back pain.It is important to recognize when you are anxious or stressed and learn ways to manage it, such as with exercise.  Take medicines only as directed by your health care provider. Over-the-counter medicines to reduce pain and inflammation are often the most helpful.Your health care provider may prescribe muscle relaxant drugs.These medicines help dull your pain so you can more quickly return to your normal activities and healthy exercise.  Apply ice to the injured area:  Put ice in a plastic bag.  Place a towel between your skin and the bag.  Leave the ice on for 20 minutes, 2-3 times a day for the first 2-3 days. After that, ice and heat may be alternated to reduce pain and spasms.  Maintain a healthy weight. Excess weight puts extra stress on your back and makes it difficult to maintain good posture. SEEK MEDICAL CARE IF:  You have pain that is not relieved with rest or medicine.  You have increasing pain going down into the legs or buttocks.  You have pain that does not improve in one week.  You have night pain.  You lose weight.  You have a fever or chills. SEEK IMMEDIATE MEDICAL CARE IF:   You develop new bowel or bladder control problems.  You have unusual weakness or numbness in your arms or legs.  You develop nausea or vomiting.  You develop abdominal pain.  You feel faint.  This information is not intended to replace advice given to you by your health care provider. Make sure you discuss any questions you have with your health care provider.   Document Released: 01/31/2005 Document Revised: 02/21/2014 Document Reviewed: 06/04/2013 Elsevier Interactive Patient Education [employer].

## 2015-04-29 ENCOUNTER — Ambulatory Visit: Payer: BC Managed Care – PPO | Admitting: Family Medicine

## 2015-05-15 ENCOUNTER — Encounter: Payer: Self-pay | Admitting: Family Medicine

## 2015-05-15 ENCOUNTER — Ambulatory Visit (INDEPENDENT_AMBULATORY_CARE_PROVIDER_SITE_OTHER): Payer: BC Managed Care – PPO | Admitting: Family Medicine

## 2015-05-15 VITALS — BP 122/84 | HR 58 | Temp 98.6°F | Ht 66.0 in | Wt 192.0 lb

## 2015-05-15 DIAGNOSIS — E669 Obesity, unspecified: Secondary | ICD-10-CM

## 2015-05-15 DIAGNOSIS — M5431 Sciatica, right side: Secondary | ICD-10-CM | POA: Diagnosis not present

## 2015-05-15 DIAGNOSIS — I10 Essential (primary) hypertension: Secondary | ICD-10-CM | POA: Diagnosis not present

## 2015-05-15 LAB — POCT GLYCOSYLATED HEMOGLOBIN (HGB A1C): HEMOGLOBIN A1C: 5.5

## 2015-05-15 MED ORDER — HYDROCHLOROTHIAZIDE 25 MG PO TABS
25.0000 mg | ORAL_TABLET | Freq: Every day | ORAL | Status: DC
Start: 2015-05-15 — End: 2016-01-28

## 2015-05-15 MED ORDER — AMLODIPINE BESYLATE 5 MG PO TABS
5.0000 mg | ORAL_TABLET | Freq: Every day | ORAL | Status: DC
Start: 2015-05-15 — End: 2016-01-28

## 2015-05-15 MED ORDER — LISINOPRIL 10 MG PO TABS
10.0000 mg | ORAL_TABLET | Freq: Every day | ORAL | Status: DC
Start: 2015-05-15 — End: 2016-01-28

## 2015-05-15 NOTE — Patient Instructions (Signed)
Nice to see you again today. We are getting some labs today and someone will call you or send you a letter with the results when they're available. Continue taking your current blood pressure pills as her blood pressure looks good today.  Take care, Dr. BLeonard Schwartz

## 2015-05-15 NOTE — Progress Notes (Signed)
   Subjective:   Shelley Allison is a 60 y.o. female with a history of HTN, spinal DJD, sciatica here for back pain and hypertension follow-up  Back and leg pain (R) - better, not 100% but getting there - robaxin rarely and mobic prn - stretches helped as well - Not interfering with function - Denies any weakness, numbness, tingling, gait abnormalities  HTN: - Medications: hctz 25mg  daily, amlodipine 5mg  daily and lisinopril 10mg  daily - Compliance: good - Checking BP at home: no - Denies any SOB, CP, vision changes, LE edema, medication SEs, or symptoms of hypotension - Diet: tries to eat low sodium - Exercise: walking at home and at work   Review of Systems:  Per HPI.   Social History: former smoker  Objective:  BP 122/84 mmHg  Pulse 58  Temp(Src) 98.6 F (37 C) (Oral)  Ht 5\' 6"  (1.676 m)  Wt 192 lb (87.091 kg)  BMI 31.00 kg/m2  SpO2 98%  Gen:  60 y.o. female in NAD, Sitting comfortably HEENT: NCAT, MMM, EOMI, PERRL, anicteric sclerae CV: RRR, no MRG Resp: Non-labored, CTAB, no wheezes noted Ext: WWP, no edema MSK: LEs: Full ROM, strength intact, sensation intact, negative straight leg raise bilaterally, no TTP over lumbar or sacral spine Neuro: Alert and oriented, speech normal  Assessment & Plan:     Shelley Allison is a 60 y.o. female here for Sciatica and HTN follow-up  HYPERTENSION, BENIGN SYSTEMIC Well-controlled today Recent BMP within normal limits Advised on diet and exercise Screening lipid panel and A1c today Follow-up in 3 months  Sciatica of right side Much improved Continue NSAIDs and muscle relaxers as needed Continue stretching Encouraged exercise Follow-up when necessary  OBESITY, NOS Advised on diet and exercise    Erasmo DownerAngela M Bacigalupo, MD MPH PGY-2,  [facility] 05/16/2015  5:26 PM

## 2015-05-16 ENCOUNTER — Encounter: Payer: Self-pay | Admitting: Family Medicine

## 2015-05-16 LAB — LIPID PANEL
CHOL/HDL RATIO: 2.7 ratio (ref <=5.0)
CHOLESTEROL: 156 mg/dL (ref 125–200)
HDL: 57 mg/dL (ref >=46)
LDL Cholesterol: 84 mg/dL (ref <130)
Triglycerides: 76 mg/dL (ref <150)
VLDL: 15 mg/dL (ref <30)

## 2015-05-16 NOTE — Assessment & Plan Note (Signed)
Advised on diet and exercise. 

## 2015-05-16 NOTE — Assessment & Plan Note (Signed)
Much improved Continue NSAIDs and muscle relaxers as needed Continue stretching Encouraged exercise Follow-up when necessary

## 2015-05-16 NOTE — Assessment & Plan Note (Signed)
Well-controlled today Recent BMP within normal limits Advised on diet and exercise Screening lipid panel and A1c today Follow-up in 3 months

## 2015-06-30 ENCOUNTER — Other Ambulatory Visit: Payer: Self-pay | Admitting: *Deleted

## 2015-06-30 DIAGNOSIS — I10 Essential (primary) hypertension: Secondary | ICD-10-CM

## 2015-07-01 ENCOUNTER — Telehealth: Payer: Self-pay | Admitting: *Deleted

## 2015-07-01 MED ORDER — TRIAMTERENE-HCTZ 75-50 MG PO TABS: 1.0000 | ORAL_TABLET | Freq: Every day | ORAL | Status: DC

## 2015-07-01 NOTE — Telephone Encounter (Signed)
[facility] has [facility] Rx for HCTZ and Maxzide.  Please verify which medication patient should be taking.  Please give them a call at [phone].  Clovis PuMartin, Tamika L, RN

## 2015-07-02 NOTE — Telephone Encounter (Signed)
Patient should be on HCTZ, not maxzide.  Clarified with pharmacist.  Erasmo DownerAngela M Arryanna Holquin, MD, MPH PGY-2,  [facility] 07/02/2015 8:45 AM

## 2015-11-24 ENCOUNTER — Encounter: Payer: Self-pay | Admitting: Family Medicine

## 2015-11-24 ENCOUNTER — Ambulatory Visit (INDEPENDENT_AMBULATORY_CARE_PROVIDER_SITE_OTHER): Payer: BC Managed Care – PPO | Admitting: Family Medicine

## 2015-11-24 VITALS — BP 164/83 | HR 70 | Temp 97.8°F | Ht 65.0 in | Wt 206.6 lb

## 2015-11-24 DIAGNOSIS — I10 Essential (primary) hypertension: Secondary | ICD-10-CM | POA: Diagnosis not present

## 2015-11-24 DIAGNOSIS — Z23 Encounter for immunization: Secondary | ICD-10-CM | POA: Diagnosis not present

## 2015-11-24 DIAGNOSIS — H109 Unspecified conjunctivitis: Secondary | ICD-10-CM | POA: Insufficient documentation

## 2015-11-24 DIAGNOSIS — Z1159 Encounter for screening for other viral diseases: Secondary | ICD-10-CM

## 2015-11-24 MED ORDER — OFLOXACIN 0.3 % OP SOLN: 1.0000 [drp] | Freq: Four times a day (QID) | OPHTHALMIC | 0 refills | Status: AC

## 2015-11-24 NOTE — Assessment & Plan Note (Signed)
2. BP elevated today.     She claimed compliance with meds.     Advised to f/u in 1-2 wks with PCP for reassessment and possible medication adjustment.     She agreed with plan.

## 2015-11-24 NOTE — Progress Notes (Signed)
Subjective:     Patient ID: Shelley Allison, female   DOB: [birth year], 60 y.o.   MRN: [id number]  Conjunctivitis   The current episode started yesterday (Right eye redness with burning and itching sensation). The onset was gradual. The problem occurs continuously. The problem has been gradually worsening. The problem is moderate. Nothing relieves the symptoms. Nothing aggravates the symptoms. Pertinent negatives include no diarrhea, no cough, no URI and no rash. The eye pain is mild. The right eye is affected. The eye pain is not associated with movement. The eyelid exhibits no abnormality. There were no sick contacts.  She used water rinse at home with no improvement. HTN: She took her meds few min before coming to the clinic. Her BP at home has been fine. Denies any cardiac or respiratory or neuro symptoms.  Current Outpatient Prescriptions on File Prior to Visit  Medication Sig Dispense Refill  . amLODipine (NORVASC) 5 MG tablet Take 1 tablet (5 mg total) by mouth daily. 30 tablet 5  . EPINEPHrine (EPIPEN) 0.3 mg/0.3 mL DEVI Inject 0.3 mg into the muscle as needed. for anaphylaxis     . hydrochlorothiazide (HYDRODIURIL) 25 MG tablet Take 1 tablet (25 mg total) by mouth daily. 30 tablet 5  . lisinopril (PRINIVIL,ZESTRIL) 10 MG tablet Take 1 tablet (10 mg total) by mouth daily. 30 tablet 5  . meloxicam (MOBIC) 7.5 MG tablet Take 1 tablet (7.5 mg total) by mouth daily. 30 tablet 0  . methocarbamol (ROBAXIN) 500 MG tablet Take 1-2 tablets (500-1,000 mg total) by mouth every 8 (eight) hours as needed for muscle spasms (and pain). (Patient taking differently: Take 500-1,000 mg by mouth every 6 (six) hours as needed for muscle spasms (and pain). ) 60 tablet 0  . triamterene-hydrochlorothiazide (MAXZIDE) 75-50 MG tablet Take 1 tablet by mouth daily. 30 tablet 5   No current facility-administered medications on file prior to visit.    Past Medical History:  Diagnosis Date  . Cerebral aneurysm rupture  (HCC) 1999  . Hypertension    Vitals:   11/24/15 0959  BP: (!) 164/83  Pulse: 70  Temp: 97.8 F (36.6 C)  TempSrc: Oral  SpO2: 100%  Weight: 206 lb 9.6 oz (93.7 kg)  Height: 5\' 5"  (1.651 m)    Review of Systems  Respiratory: Negative.  Negative for cough.   Cardiovascular: Negative.   Gastrointestinal: Negative for diarrhea.  Genitourinary: Negative.   Skin: Negative for rash.  Neurological: Negative.   All other systems reviewed and are negative.      Objective:   Physical Exam  Constitutional: She appears well-developed. No distress.  HENT:  Head: Normocephalic and atraumatic.  Eyes: EOM are normal. Pupils are equal, round, and reactive to light. Right eye exhibits no chemosis and no discharge. Left eye exhibits no chemosis and no discharge. Right conjunctiva is injected. Right conjunctiva has no hemorrhage. Left conjunctiva is not injected. Left conjunctiva has no hemorrhage. No scleral icterus.    Neck: Neck supple.  Cardiovascular: Normal rate, regular rhythm and normal heart sounds.   No murmur heard. Pulmonary/Chest: Effort normal and breath sounds normal. No respiratory distress. She has no wheezes.  Musculoskeletal: Normal range of motion. She exhibits no edema.  Lymphadenopathy:    She has no cervical adenopathy.  Nursing note and vitals reviewed.      Assessment:     Conjunctivitis HTN    Plan:     1. Likely viral but appears pretty red with concern for superimposed  bacterial infection.     No visual loss.     Ophthalmic A/B prescribed for 5-7 days.     Patient instructed to return to us soon or go to the ED if eye become more painful or there is vision change.    She agreed with plan and verbalized understanding.    Good hand hygiene recommended.  2. BP elevated today.     She claimed compliance with meds.     Advised to f/u in 1-2 wks with PCP for reassessment and possible medication adjustment.     She agreed with plan.

## 2015-11-24 NOTE — Assessment & Plan Note (Signed)
  1. Likely viral but appears pretty red with concern for superimposed bacterial infection.     No visual loss.     Ophthalmic A/B prescribed for 5-7 days.     Patient instructed to return to us soon or go to the ED if eye become more painful or there is vision change.    She agreed with plan and verbalized understanding.    Good hand hygiene recommended.

## 2015-11-24 NOTE — Patient Instructions (Signed)
Bacterial Conjunctivitis Bacterial conjunctivitis (commonly called pink eye) is redness, soreness, or puffiness (inflammation) of the white part of your eye. It is caused by a germ called bacteria. These germs can easily spread from person to person (contagious). Your eye often will become red or pink. Your eye may also become irritated, watery, or have a thick discharge.  HOME CARE   Apply a cool, clean washcloth over closed eyelids. Do this for 10-20 minutes, 3-4 times a day while you have pain.  Gently wipe away any fluid coming from the eye with a warm, wet washcloth or cotton ball.  Wash your hands often with soap and water. Use paper towels to dry your hands.  Do not share towels or washcloths.  Change or wash your pillowcase every day.  Do not use eye makeup until the infection is gone.  Do not use machines or drive if your vision is blurry.  Stop using contact lenses. Do not use them again until your doctor says it is okay.  Do not touch the tip of the eye drop bottle or medicine tube with your fingers when you put medicine on the eye. GET HELP RIGHT AWAY IF:   Your eye is not better after 3 days of starting your medicine.  You have a yellowish fluid coming out of the eye.  You have more pain in the eye.  Your eye redness is spreading.  Your vision becomes blurry.  You have a fever or lasting symptoms for more than 2-3 days.  You have a fever and your symptoms suddenly get worse.  You have pain in the face.  Your face gets red or puffy (swollen). MAKE SURE YOU:   Understand these instructions.  Will watch this condition.  Will get help right away if you are not doing well or get worse.   This information is not intended to replace advice given to you by your health care provider. Make sure you discuss any questions you have with your health care provider.   Document Released: 11/10/2007 Document Revised: 01/18/2012 Document Reviewed: 10/07/2011 Elsevier  Interactive Patient Education 2016 Elsevier Inc.  

## 2015-11-25 LAB — HEPATITIS C ANTIBODY: HCV Ab: NEGATIVE

## 2016-01-28 ENCOUNTER — Other Ambulatory Visit: Payer: Self-pay | Admitting: Family Medicine

## 2016-01-28 DIAGNOSIS — I10 Essential (primary) hypertension: Secondary | ICD-10-CM

## 2016-10-30 ENCOUNTER — Other Ambulatory Visit: Payer: Self-pay | Admitting: Family Medicine

## 2016-10-30 DIAGNOSIS — I10 Essential (primary) hypertension: Secondary | ICD-10-CM

## 2016-10-31 NOTE — Telephone Encounter (Signed)
Will forward to new PCP  Bacigalupo, Marzella Schlein, MD, MPH [facility] 10/31/2016 8:25 AM

## 2017-08-08 ENCOUNTER — Other Ambulatory Visit: Payer: Self-pay | Admitting: *Deleted

## 2017-08-08 DIAGNOSIS — I10 Essential (primary) hypertension: Secondary | ICD-10-CM

## 2017-08-09 MED ORDER — LISINOPRIL 10 MG PO TABS: 10.0000 mg | ORAL_TABLET | Freq: Every day | ORAL | 0 refills | Status: DC

## 2017-08-09 MED ORDER — AMLODIPINE BESYLATE 5 MG PO TABS: 5.0000 mg | ORAL_TABLET | Freq: Every day | ORAL | 0 refills | Status: DC

## 2017-08-09 MED ORDER — HYDROCHLOROTHIAZIDE 25 MG PO TABS: 25.0000 mg | ORAL_TABLET | Freq: Every day | ORAL | 0 refills | Status: DC

## 2017-08-21 DIAGNOSIS — I10 Essential (primary) hypertension: Secondary | ICD-10-CM

## 2017-11-14 ENCOUNTER — Telehealth: Payer: Self-pay | Admitting: Family Medicine

## 2017-11-14 NOTE — Telephone Encounter (Signed)
The phone kept ringing so I was unable to leave a message regarding a health maintenance check up.

## 2017-12-04 ENCOUNTER — Other Ambulatory Visit: Payer: Self-pay

## 2017-12-04 DIAGNOSIS — I10 Essential (primary) hypertension: Secondary | ICD-10-CM

## 2017-12-04 MED ORDER — HYDROCHLOROTHIAZIDE 25 MG PO TABS: 25.0000 mg | ORAL_TABLET | Freq: Every day | ORAL | 0 refills | Status: DC

## 2017-12-04 MED ORDER — AMLODIPINE BESYLATE 5 MG PO TABS: 5.0000 mg | ORAL_TABLET | Freq: Every day | ORAL | 0 refills | Status: DC

## 2017-12-04 MED ORDER — LISINOPRIL 10 MG PO TABS: 10.0000 mg | ORAL_TABLET | Freq: Every day | ORAL | 2 refills | Status: DC

## 2018-03-11 ENCOUNTER — Other Ambulatory Visit: Payer: Self-pay | Admitting: Family Medicine

## 2018-03-11 DIAGNOSIS — I10 Essential (primary) hypertension: Secondary | ICD-10-CM

## 2018-03-22 ENCOUNTER — Encounter: Payer: Self-pay | Admitting: Gastroenterology

## 2018-07-26 ENCOUNTER — Other Ambulatory Visit: Payer: Self-pay | Admitting: Family Medicine

## 2018-07-26 DIAGNOSIS — I10 Essential (primary) hypertension: Secondary | ICD-10-CM

## 2018-11-01 ENCOUNTER — Other Ambulatory Visit: Payer: Self-pay | Admitting: *Deleted

## 2018-11-01 DIAGNOSIS — I10 Essential (primary) hypertension: Secondary | ICD-10-CM

## 2018-11-01 MED ORDER — AMLODIPINE BESYLATE 5 MG PO TABS: ORAL_TABLET | ORAL | 3 refills | Status: AC

## 2018-11-01 MED ORDER — LISINOPRIL 10 MG PO TABS: 10.0000 mg | ORAL_TABLET | Freq: Every day | ORAL | 3 refills | Status: AC

## 2018-11-01 MED ORDER — HYDROCHLOROTHIAZIDE 25 MG PO TABS: ORAL_TABLET | ORAL | 3 refills | Status: AC

## 2018-11-01 NOTE — Telephone Encounter (Signed)
Please schedule patient for appointment to check BP and physical.  Thank you

## 2018-11-12 ENCOUNTER — Emergency Department (HOSPITAL_COMMUNITY): Payer: BC Managed Care – PPO

## 2018-11-12 ENCOUNTER — Other Ambulatory Visit: Payer: Self-pay

## 2018-11-12 ENCOUNTER — Encounter (HOSPITAL_COMMUNITY): Payer: Self-pay

## 2018-11-12 ENCOUNTER — Inpatient Hospital Stay (HOSPITAL_COMMUNITY)
Admission: EM | Admit: 2018-11-12 | Discharge: 2018-12-16 | DRG: 207 | Disposition: E | Payer: BC Managed Care – PPO | Attending: Internal Medicine | Admitting: Internal Medicine

## 2018-11-12 DIAGNOSIS — J069 Acute upper respiratory infection, unspecified: Secondary | ICD-10-CM

## 2018-11-12 DIAGNOSIS — E870 Hyperosmolality and hypernatremia: Secondary | ICD-10-CM | POA: Diagnosis not present

## 2018-11-12 DIAGNOSIS — I248 Other forms of acute ischemic heart disease: Secondary | ICD-10-CM | POA: Diagnosis not present

## 2018-11-12 DIAGNOSIS — J9601 Acute respiratory failure with hypoxia: Secondary | ICD-10-CM

## 2018-11-12 DIAGNOSIS — R0602 Shortness of breath: Secondary | ICD-10-CM | POA: Diagnosis not present

## 2018-11-12 DIAGNOSIS — I609 Nontraumatic subarachnoid hemorrhage, unspecified: Secondary | ICD-10-CM

## 2018-11-12 DIAGNOSIS — D539 Nutritional anemia, unspecified: Secondary | ICD-10-CM | POA: Diagnosis not present

## 2018-11-12 DIAGNOSIS — Z66 Do not resuscitate: Secondary | ICD-10-CM | POA: Diagnosis not present

## 2018-11-12 DIAGNOSIS — Z91010 Allergy to peanuts: Secondary | ICD-10-CM

## 2018-11-12 DIAGNOSIS — Z79899 Other long term (current) drug therapy: Secondary | ICD-10-CM

## 2018-11-12 DIAGNOSIS — R0902 Hypoxemia: Secondary | ICD-10-CM | POA: Diagnosis not present

## 2018-11-12 DIAGNOSIS — Z01818 Encounter for other preprocedural examination: Secondary | ICD-10-CM

## 2018-11-12 DIAGNOSIS — J15211 Pneumonia due to Methicillin susceptible Staphylococcus aureus: Secondary | ICD-10-CM | POA: Diagnosis not present

## 2018-11-12 DIAGNOSIS — E872 Acidosis: Secondary | ICD-10-CM | POA: Diagnosis not present

## 2018-11-12 DIAGNOSIS — R579 Shock, unspecified: Secondary | ICD-10-CM | POA: Diagnosis not present

## 2018-11-12 DIAGNOSIS — I10 Essential (primary) hypertension: Secondary | ICD-10-CM | POA: Diagnosis not present

## 2018-11-12 DIAGNOSIS — R0600 Dyspnea, unspecified: Secondary | ICD-10-CM | POA: Diagnosis not present

## 2018-11-12 DIAGNOSIS — I129 Hypertensive chronic kidney disease with stage 1 through stage 4 chronic kidney disease, or unspecified chronic kidney disease: Secondary | ICD-10-CM | POA: Diagnosis present

## 2018-11-12 DIAGNOSIS — Z91018 Allergy to other foods: Secondary | ICD-10-CM

## 2018-11-12 DIAGNOSIS — Z8673 Personal history of transient ischemic attack (TIA), and cerebral infarction without residual deficits: Secondary | ICD-10-CM

## 2018-11-12 DIAGNOSIS — Z7189 Other specified counseling: Secondary | ICD-10-CM

## 2018-11-12 DIAGNOSIS — Z0189 Encounter for other specified special examinations: Secondary | ICD-10-CM

## 2018-11-12 DIAGNOSIS — K529 Noninfective gastroenteritis and colitis, unspecified: Secondary | ICD-10-CM | POA: Diagnosis present

## 2018-11-12 DIAGNOSIS — G9341 Metabolic encephalopathy: Secondary | ICD-10-CM | POA: Diagnosis not present

## 2018-11-12 DIAGNOSIS — J9602 Acute respiratory failure with hypercapnia: Secondary | ICD-10-CM | POA: Diagnosis not present

## 2018-11-12 DIAGNOSIS — R7401 Elevation of levels of liver transaminase levels: Secondary | ICD-10-CM | POA: Diagnosis not present

## 2018-11-12 DIAGNOSIS — R739 Hyperglycemia, unspecified: Secondary | ICD-10-CM | POA: Diagnosis not present

## 2018-11-12 DIAGNOSIS — Z515 Encounter for palliative care: Secondary | ICD-10-CM | POA: Diagnosis not present

## 2018-11-12 DIAGNOSIS — E8770 Fluid overload, unspecified: Secondary | ICD-10-CM | POA: Diagnosis not present

## 2018-11-12 DIAGNOSIS — Z9289 Personal history of other medical treatment: Secondary | ICD-10-CM

## 2018-11-12 DIAGNOSIS — R7989 Other specified abnormal findings of blood chemistry: Secondary | ICD-10-CM | POA: Diagnosis not present

## 2018-11-12 DIAGNOSIS — Z87891 Personal history of nicotine dependence: Secondary | ICD-10-CM

## 2018-11-12 DIAGNOSIS — N183 Chronic kidney disease, stage 3 unspecified: Secondary | ICD-10-CM | POA: Diagnosis present

## 2018-11-12 DIAGNOSIS — J988 Other specified respiratory disorders: Secondary | ICD-10-CM | POA: Diagnosis present

## 2018-11-12 DIAGNOSIS — I2699 Other pulmonary embolism without acute cor pulmonale: Secondary | ICD-10-CM | POA: Diagnosis not present

## 2018-11-12 DIAGNOSIS — T502X5A Adverse effect of carbonic-anhydrase inhibitors, benzothiadiazides and other diuretics, initial encounter: Secondary | ICD-10-CM | POA: Diagnosis not present

## 2018-11-12 DIAGNOSIS — E875 Hyperkalemia: Secondary | ICD-10-CM | POA: Diagnosis present

## 2018-11-12 DIAGNOSIS — J939 Pneumothorax, unspecified: Secondary | ICD-10-CM | POA: Diagnosis not present

## 2018-11-12 DIAGNOSIS — Z6841 Body Mass Index (BMI) 40.0 and over, adult: Secondary | ICD-10-CM

## 2018-11-12 DIAGNOSIS — Z91013 Allergy to seafood: Secondary | ICD-10-CM

## 2018-11-12 DIAGNOSIS — R740 Nonspecific elevation of levels of transaminase and lactic acid dehydrogenase [LDH]: Secondary | ICD-10-CM | POA: Diagnosis not present

## 2018-11-12 DIAGNOSIS — J9690 Respiratory failure, unspecified, unspecified whether with hypoxia or hypercapnia: Secondary | ICD-10-CM

## 2018-11-12 DIAGNOSIS — J1289 Other viral pneumonia: Secondary | ICD-10-CM | POA: Diagnosis present

## 2018-11-12 DIAGNOSIS — U071 COVID-19: Secondary | ICD-10-CM | POA: Diagnosis present

## 2018-11-12 DIAGNOSIS — T380X5A Adverse effect of glucocorticoids and synthetic analogues, initial encounter: Secondary | ICD-10-CM | POA: Diagnosis not present

## 2018-11-12 DIAGNOSIS — E538 Deficiency of other specified B group vitamins: Secondary | ICD-10-CM | POA: Diagnosis not present

## 2018-11-12 DIAGNOSIS — Z6836 Body mass index (BMI) 36.0-36.9, adult: Secondary | ICD-10-CM

## 2018-11-12 DIAGNOSIS — A0839 Other viral enteritis: Secondary | ICD-10-CM

## 2018-11-12 DIAGNOSIS — N179 Acute kidney failure, unspecified: Secondary | ICD-10-CM | POA: Diagnosis present

## 2018-11-12 DIAGNOSIS — I2690 Septic pulmonary embolism without acute cor pulmonale: Secondary | ICD-10-CM | POA: Diagnosis not present

## 2018-11-12 DIAGNOSIS — J189 Pneumonia, unspecified organism: Secondary | ICD-10-CM | POA: Diagnosis not present

## 2018-11-12 DIAGNOSIS — E669 Obesity, unspecified: Secondary | ICD-10-CM | POA: Diagnosis present

## 2018-11-12 LAB — COMPREHENSIVE METABOLIC PANEL
ALT: 144 U/L — ABNORMAL HIGH (ref 0–44)
AST: 226 U/L — ABNORMAL HIGH (ref 15–41)
Albumin: 2.7 g/dL — ABNORMAL LOW (ref 3.5–5.0)
Alkaline Phosphatase: 70 U/L (ref 38–126)
Anion gap: 12 (ref 5–15)
BUN: 25 mg/dL — ABNORMAL HIGH (ref 8–23)
CO2: 21 mmol/L — ABNORMAL LOW (ref 22–32)
Calcium: 8.6 mg/dL — ABNORMAL LOW (ref 8.9–10.3)
Chloride: 106 mmol/L (ref 98–111)
Creatinine, Ser: 1.43 mg/dL — ABNORMAL HIGH (ref 0.44–1.00)
GFR calc Af Amer: 45 mL/min — ABNORMAL LOW (ref >60)
GFR calc non Af Amer: 39 mL/min — ABNORMAL LOW (ref >60)
Glucose, Bld: 130 mg/dL — ABNORMAL HIGH (ref 70–99)
Potassium: 3.2 mmol/L — ABNORMAL LOW (ref 3.5–5.1)
Sodium: 139 mmol/L (ref 135–145)
Total Bilirubin: 1.1 mg/dL (ref 0.3–1.2)
Total Protein: 7.9 g/dL (ref 6.5–8.1)

## 2018-11-12 LAB — CBC WITH DIFFERENTIAL/PLATELET
Abs Immature Granulocytes: 0 10*3/uL (ref 0.00–0.07)
Basophils Absolute: 0 10*3/uL (ref 0.0–0.1)
Basophils Relative: 0 %
Eosinophils Absolute: 0 10*3/uL (ref 0.0–0.5)
Eosinophils Relative: 0 %
HCT: 40.9 % (ref 36.0–46.0)
Hemoglobin: 13.5 g/dL (ref 12.0–15.0)
Lymphocytes Relative: 10 %
Lymphs Abs: 0.7 10*3/uL (ref 0.7–4.0)
MCH: 28.8 pg (ref 26.0–34.0)
MCHC: 33 g/dL (ref 30.0–36.0)
MCV: 87.4 fL (ref 80.0–100.0)
Monocytes Absolute: 0.3 10*3/uL (ref 0.1–1.0)
Monocytes Relative: 4 %
Neutro Abs: 6.4 10*3/uL (ref 1.7–7.7)
Neutrophils Relative %: 86 %
Platelets: 339 10*3/uL (ref 150–400)
RBC: 4.68 MIL/uL (ref 3.87–5.11)
RDW: 13.9 % (ref 11.5–15.5)
WBC: 7.4 10*3/uL (ref 4.0–10.5)
nRBC: 0 % (ref 0.0–0.2)
nRBC: 0 /100 WBC

## 2018-11-12 LAB — BRAIN NATRIURETIC PEPTIDE
B Natriuretic Peptide: 44.2 pg/mL (ref 0.0–100.0)
B Natriuretic Peptide: 61.1 pg/mL (ref 0.0–100.0)

## 2018-11-12 LAB — SARS CORONAVIRUS 2 BY RT PCR (HOSPITAL ORDER, PERFORMED IN ~~LOC~~ HOSPITAL LAB): SARS Coronavirus 2: POSITIVE — AB

## 2018-11-12 LAB — FIBRINOGEN: Fibrinogen: 747 mg/dL — ABNORMAL HIGH (ref 210–475)

## 2018-11-12 LAB — FERRITIN: Ferritin: 1828 ng/mL — ABNORMAL HIGH (ref 11–307)

## 2018-11-12 LAB — D-DIMER, QUANTITATIVE: D-Dimer, Quant: 2.93 ug/mL-FEU — ABNORMAL HIGH (ref 0.00–0.50)

## 2018-11-12 LAB — LACTATE DEHYDROGENASE: LDH: 624 U/L — ABNORMAL HIGH (ref 98–192)

## 2018-11-12 LAB — TROPONIN I (HIGH SENSITIVITY)
Troponin I (High Sensitivity): 17 ng/L (ref <18)
Troponin I (High Sensitivity): 15 ng/L (ref <18)

## 2018-11-12 LAB — C-REACTIVE PROTEIN: CRP: 20 mg/dL — ABNORMAL HIGH (ref <1.0)

## 2018-11-12 LAB — PROCALCITONIN: Procalcitonin: 0.17 ng/mL

## 2018-11-12 MED ORDER — ONDANSETRON HCL 4 MG/2ML IJ SOLN
4.0000 mg | Freq: Once | INTRAMUSCULAR | Status: AC
  Administered 2018-11-12: 4 mg via INTRAVENOUS
  Filled 2018-11-12: qty 2

## 2018-11-12 MED ORDER — ONDANSETRON HCL 4 MG PO TABS: 4.0000 mg | ORAL_TABLET | Freq: Four times a day (QID) | ORAL | Status: DC | PRN

## 2018-11-12 MED ORDER — SODIUM CHLORIDE 0.9% FLUSH
3.0000 mL | INTRAVENOUS | Status: DC | PRN
  Administered 2018-11-15: 02:00:00 3 mL via INTRAVENOUS
  Filled 2018-11-12: qty 3

## 2018-11-12 MED ORDER — SODIUM CHLORIDE 0.9% FLUSH
3.0000 mL | Freq: Two times a day (BID) | INTRAVENOUS | Status: DC
  Administered 2018-11-13 – 2018-11-22 (×18): 3 mL via INTRAVENOUS

## 2018-11-12 MED ORDER — DEXAMETHASONE SODIUM PHOSPHATE 10 MG/ML IJ SOLN
10.0000 mg | Freq: Once | INTRAMUSCULAR | Status: AC
  Administered 2018-11-12: 10 mg via INTRAVENOUS
  Filled 2018-11-12: qty 1

## 2018-11-12 MED ORDER — SODIUM CHLORIDE 0.9 % IV SOLN
500.0000 mg | Freq: Once | INTRAVENOUS | Status: AC
  Administered 2018-11-12: 500 mg via INTRAVENOUS
  Filled 2018-11-12: qty 500

## 2018-11-12 MED ORDER — SODIUM CHLORIDE 0.9 % IV SOLN
200.0000 mg | Freq: Once | INTRAVENOUS | Status: AC
  Administered 2018-11-12: 200 mg via INTRAVENOUS
  Filled 2018-11-12: qty 40

## 2018-11-12 MED ORDER — ONDANSETRON HCL 4 MG/2ML IJ SOLN
4.0000 mg | Freq: Four times a day (QID) | INTRAMUSCULAR | Status: DC | PRN
  Administered 2018-11-15: 4 mg via INTRAVENOUS
  Filled 2018-11-12: qty 2

## 2018-11-12 MED ORDER — METHYLPREDNISOLONE SODIUM SUCC 125 MG IJ SOLR
60.0000 mg | Freq: Two times a day (BID) | INTRAMUSCULAR | Status: AC
  Administered 2018-11-12 – 2018-11-21 (×19): 60 mg via INTRAVENOUS
  Filled 2018-11-12 (×19): qty 2

## 2018-11-12 MED ORDER — SODIUM CHLORIDE 0.9 % IV SOLN
250.0000 mL | INTRAVENOUS | Status: DC | PRN
  Administered 2018-11-12: 250 mL via INTRAVENOUS

## 2018-11-12 MED ORDER — AMLODIPINE BESYLATE 5 MG PO TABS
5.0000 mg | ORAL_TABLET | Freq: Every day | ORAL | Status: DC
  Administered 2018-11-12 – 2018-11-13 (×2): 5 mg via ORAL
  Filled 2018-11-12 (×2): qty 1

## 2018-11-12 MED ORDER — SODIUM CHLORIDE 0.9 % IV SOLN
1.0000 g | Freq: Once | INTRAVENOUS | Status: AC
  Administered 2018-11-12: 1 g via INTRAVENOUS
  Filled 2018-11-12: qty 10

## 2018-11-12 MED ORDER — POTASSIUM CHLORIDE IN NACL 20-0.9 MEQ/L-% IV SOLN: INTRAVENOUS | Status: DC

## 2018-11-12 MED ORDER — SODIUM CHLORIDE 0.9 % IV SOLN
500.0000 mg | Freq: Once | INTRAVENOUS | Status: AC
  Administered 2018-11-13: 500 mg via INTRAVENOUS
  Filled 2018-11-12: qty 500

## 2018-11-12 MED ORDER — ZOLPIDEM TARTRATE 5 MG PO TABS
5.0000 mg | ORAL_TABLET | Freq: Every evening | ORAL | Status: DC | PRN
  Administered 2018-11-13 – 2018-11-15 (×3): 5 mg via ORAL
  Filled 2018-11-12 (×3): qty 1

## 2018-11-12 MED ORDER — SODIUM CHLORIDE 0.9 % IV BOLUS
1000.0000 mL | Freq: Once | INTRAVENOUS | Status: AC
  Administered 2018-11-12: 1000 mL via INTRAVENOUS

## 2018-11-12 MED ORDER — OXYCODONE HCL 5 MG PO TABS: 5.0000 mg | ORAL_TABLET | ORAL | Status: DC | PRN

## 2018-11-12 MED ORDER — ENOXAPARIN SODIUM 40 MG/0.4ML ~~LOC~~ SOLN
40.0000 mg | SUBCUTANEOUS | Status: DC
  Administered 2018-11-12 – 2018-11-13 (×2): 40 mg via SUBCUTANEOUS
  Filled 2018-11-12 (×2): qty 0.4

## 2018-11-12 MED ORDER — POTASSIUM CHLORIDE IN NACL 20-0.9 MEQ/L-% IV SOLN
INTRAVENOUS | Status: DC
  Administered 2018-11-12 – 2018-11-15 (×5): via INTRAVENOUS
  Filled 2018-11-12 (×5): qty 1000

## 2018-11-12 MED ORDER — HYDRALAZINE HCL 20 MG/ML IJ SOLN
5.0000 mg | INTRAMUSCULAR | Status: DC | PRN
  Administered 2018-11-13 – 2018-11-14 (×4): 5 mg via INTRAVENOUS
  Filled 2018-11-12 (×4): qty 1

## 2018-11-12 MED ORDER — SODIUM CHLORIDE 0.9 % IV SOLN
1.0000 g | INTRAVENOUS | Status: DC
  Administered 2018-11-13 – 2018-11-15 (×3): 1 g via INTRAVENOUS
  Filled 2018-11-12 (×4): qty 10

## 2018-11-12 MED ORDER — SODIUM CHLORIDE 0.9 % IV SOLN
100.0000 mg | INTRAVENOUS | Status: AC
  Administered 2018-11-13 – 2018-11-16 (×4): 100 mg via INTRAVENOUS
  Filled 2018-11-12 (×4): qty 20

## 2018-11-12 MED ORDER — ACETAMINOPHEN 325 MG PO TABS: 650.0000 mg | ORAL_TABLET | Freq: Four times a day (QID) | ORAL | Status: DC | PRN

## 2018-11-12 MED ORDER — MORPHINE SULFATE (PF) 4 MG/ML IV SOLN
4.0000 mg | Freq: Once | INTRAVENOUS | Status: AC
  Administered 2018-11-12: 4 mg via INTRAVENOUS
  Filled 2018-11-12: qty 1

## 2018-11-12 NOTE — ED Notes (Signed)
Pt's Daughter in [place] [address] is asking for an update on this Pt

## 2018-11-12 NOTE — ED Triage Notes (Signed)
Pt here for weakness and n/v/d x 1 week. Unable to keep anything on her stomach. Pt O2 sats 74% in triage, placed on supplemental O2 via nasal cannula without improvement.

## 2018-11-12 NOTE — H&P (Signed)
History and Physical    Shelley Allison JOI:[id number] DOB: 07/04/1955 DOA: 10/30/2018  PCP: Leeroy Bock, DO Consultants:  None Patient coming from:  Home; NOK: Mother, Sueanne Margarita, [phone]  Chief Complaint: n/v/d, SOB  HPI: Shelley Allison is a 63 y.o. female with medical history significant of HTN and cerebral aneurysmal rupture presenting with GI symptoms and SOB.  She reports about a week of n/v/d, inability to keep anything down.  She began about 4 days ago with cough productive of yellowish sputum.  She became SOB over the weekend.  + fever, 100+.  She spends a lot of time with her daughter, who works on campus (not remote) at [employer] and who also attended a wedding with about 150 participants in mid-late September; her daughter is in the waiting room with similar symptoms that started about the same time.     ED Course:  COVID patient.  N/V/D, O2 68% on RA.  Placed on 6L, up to 91%.  Now on 5L.  Labs ok, COVID +.  CXR with PNA.  Review of Systems: As per HPI; otherwise review of systems reviewed and negative.   Ambulatory Status:  Ambulates without assistance  Past Medical History:  Diagnosis Date  . Cerebral aneurysm rupture (HCC) 1999  . Hypertension     Past Surgical History:  Procedure Laterality Date  . BRAIN SURGERY    . CESAREAN SECTION    . CHOLECYSTECTOMY N/A 04/07/2014   Procedure: LAPAROSCOPIC CHOLECYSTECTOMY;  Surgeon: Frederik Schmidt, MD;  Location: [facility] OR;  Service: General;  Laterality: N/A;  . HERNIA REPAIR      Social History   Socioeconomic History  . Marital status: Divorced    Spouse name: Not on file  . Number of children: Not on file  . Years of education: Not on file  . Highest education level: Not on file  Occupational History  . Occupation: custodian     Comment: [employer]   Social Needs  . Financial resource strain: Not on file  . Food insecurity    Worry: Not on file    Inability: Not on file  . Transportation needs     Medical: Not on file    Non-medical: Not on file  Tobacco Use  . Smoking status: Former Smoker    Quit date: 12/18/1996    Years since quitting: 21.9  . Smokeless tobacco: Never Used  Substance and Sexual Activity  . Alcohol use: No    Comment: occasional  . Drug use: No  . Sexual activity: Not on file  Lifestyle  . Physical activity    Days per week: Not on file    Minutes per session: Not on file  . Stress: Not on file  Relationships  . Social Musician on phone: Not on file    Gets together: Not on file    Attends religious service: Not on file    Active member of club or organization: Not on file    Attends meetings of clubs or organizations: Not on file    Relationship status: Not on file  . Intimate partner violence    Fear of current or ex partner: Not on file    Emotionally abused: Not on file    Physically abused: Not on file    Forced sexual activity: Not on file  Other Topics Concern  . Not on file  Social History Narrative  . Not on file    Allergies  Allergen Reactions  . Fish Allergy Hives and Shortness Of Breath    All fish and fish derivatives  . Other Hives, Shortness Of Breath and Rash    All Pitted Fruit  . Peanuts [Peanut Oil]   . Shellfish Allergy     Family History  Problem Relation Age of Onset  . Arthritis Mother     Prior to Admission medications   Medication Sig Start Date End Date Taking? Authorizing Provider  acetaminophen (TYLENOL) 500 MG tablet Take 1,000 mg by mouth every 6 (six) hours as needed for mild pain or headache.   Yes [provider]  amLODipine (NORVASC) 5 MG tablet TAKE 1 TABLET(5 MG) BY MOUTH DAILY Patient taking differently: Take 5 mg by mouth daily. TAKE 1 TABLET(5 MG) BY MOUTH DAILY 11/01/18  Yes Anderson, Chelsey L, DO  EPINEPHrine (EPIPEN) 0.3 mg/0.3 mL DEVI Inject 0.3 mg into the muscle as needed. for anaphylaxis    Yes [provider]  hydrochlorothiazide (HYDRODIURIL) 25 MG  tablet TAKE 1 TABLET(25 MG) BY MOUTH DAILY Patient taking differently: Take 25 mg by mouth daily. TAKE 1 TABLET(25 MG) BY MOUTH DAILY 11/01/18  Yes Anderson, Chelsey L, DO  lisinopril (ZESTRIL) 10 MG tablet Take 1 tablet (10 mg total) by mouth daily. 11/01/18  Yes Anderson, Chelsey L, DO  meloxicam (MOBIC) 7.5 MG tablet Take 1 tablet (7.5 mg total) by mouth daily. Patient not taking: Reported on 2018-10-05 04/15/15   Erasmo DownerBacigalupo, Angela M, MD  methocarbamol (ROBAXIN) 500 MG tablet Take 1-2 tablets (500-1,000 mg total) by mouth every 8 (eight) hours as needed for muscle spasms (and pain). Patient not taking: Reported on 2018-10-05 04/15/15   Erasmo DownerBacigalupo, Angela M, MD  triamterene-hydrochlorothiazide (MAXZIDE) 75-50 MG tablet Take 1 tablet by mouth daily. Patient not taking: Reported on 2018-10-05 07/01/15   Erasmo DownerBacigalupo, Angela M, MD    Physical Exam: Vitals:   Nov 29, 2018 1545 Nov 29, 2018 1600 Nov 29, 2018 1630 Nov 29, 2018 1730  BP: (!) 181/89 (!) 167/88 (!) 169/98 (!) 168/94  Pulse: 83 80 81 83  Resp: (!) 32 (!) 43 (!) 41 (!) 50  Temp:      TempSrc:      SpO2: 92% (!) 89% 91% 90%  Weight:      Height:         . General:  Appears calm and comfortable and is NAD with mild-moderate respiratory distress . Eyes:  PERRL, EOMI, normal lids, iris . ENT:  grossly normal hearing, lips & tongue, mildly dry mm . Neck:  no LAD, masses or thyromegaly . Cardiovascular:  RRR, no m/r/g. No LE edema.  Marland Kitchen. Respiratory:   Mild diffuse rhonchi.  Increased respiratory effort with persistent tachypnea in the 30s. . Abdomen:  soft, NT, ND, NABS . Skin:  no rash or induration seen on limited exam . Musculoskeletal:  grossly normal tone BUE/BLE, good ROM, no bony abnormality. Marland Kitchen. Psychiatric: blunted mood and affect, speech fluent and appropriate, AOx3 . Neurologic:  CN 2-12 grossly intact, moves all extremities in coordinated fashion, sensation intact    Radiological Exams on Admission: Dg Chest Port 1 View  Result Date:  2018-10-05 CLINICAL DATA:  Shortness of breath.  Decreased oxygen saturation EXAM: PORTABLE CHEST 1 VIEW COMPARISON:  April 06, 2014 FINDINGS: There is fine reticular interstitial prominence throughout the lungs. There is atelectatic change in the left base. Heart is upper normal in size with pulmonary vascularity normal. No adenopathy. No bone lesions. 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 There is  atelectatic change in the left base. Early pneumonia in the left base cannot be excluded. Heart upper normal in size.  No adenopathy evident. Electronically Signed   By: Bretta Bang III M.D.   On: 11/13/2018 13:58    EKG: Independently reviewed.  NSR with rate 91; nonspecific ST changes with no evidence of acute ischemia; NSCSLT   Labs on Admission: I have personally reviewed the available labs and imaging studies at the time of the admission.  Pertinent labs:   K+ 3.2 CO2 21 Glucose 130 BUN 25/Creatinine 1.43/GFR 45 Albumin 2.7 AST 226/ALT 144 HS troponin 17 Normal CBC COVID POSITIVE BNP 61.1 LDH 624 Ferritin 1828 CRP 20.0 Procalcitonin 0.17 D-dimer 2.93 Fibrinogen 747   Assessment/Plan Principal Problem:   Respiratory tract infection due to COVID-19 virus Active Problems:   OBESITY, NOS   HYPERTENSION, BENIGN SYSTEMIC   Acute respiratory failure with hypoxia -Patient with presenting with SOB and n/v/d; also with cough productive of yellow sputum -She does not have a usual home O2 requirement and is currently requiring 5-6L [place] O2 -COVID POSITIVE -The patient has comorbidities which may increase the risk for ARDS/MODS including: age, HTN, obesity -Exam is concerning for development of ARDS/MODS due to respiratory distress, hepatic failure -Pertinent labs concerning for COVID include normal WBC count; increased BUN/Creatinine; increased LFTs; markedly elevated D-dimer (>1); markedly elevated CRP (>>7); increased LDH; increased  ferritin; increased fibrinogen -CXR with multifocal opacities which may be c/w COVID -Will treat with broad-spectrum antibiotics given procalcitonin >0.1.   -Will not treat with broad-spectrum antibiotics given procalcitonin <0.1 -Will admit to [facility] for further evaluation, close monitoring, and treatment -Monitor on telemetry x at least 24 hours -At this time, will attempt to avoid use of aerosolized medications and use HFAs instead -Will check daily labs including BMP with Mag, Phos; LFTs; CBC with differential; CRP (q12h); ferritin; fibrinogen; D-dimer (q12h) -Will order steroids (1 mg/kg divided BID) and Remdesivir (pharmacy consult) given +COVID test, +CXR, and hypoxia <94% on room air -If the patient shows clinical deterioration, consider transfer to ICU with PCCM consultation -If the patient is hypoxic or on >3L oxygen from baseline or CRP >7, considerTocilizumab 8 mg/kg x1 IF + COVID test; O2 sats <88% on room air; and patient is at high risk for intubation.  Will defer to [facility] doctors regarding this medication since it is being used off label. -Will attempt to maintain euvolemia to a net negative fluid status -Will ask the patient to maintain an awake prone position for 16+ hours a day, if possible, with a minimum of 2-3 hours at a time -With D-dimer <5, will use standard-dosed Lovenox for DVT prevention -Patient was seen wearing full PPE including: gown, gloves, head cover, N95, and face shield; donning and doffing was in compliance with current standards.  AKI -Prior creatinine 0.76 in 3/17, now 1.43 -Hold HCTZ and Lisinopril -Avoid nephrotoxic medications -Gentle IVF but need to monitor given COVID status  HTN -Continue Norvasc -Hold HCTZ and Lisinopril for now -Will use prn IV hydralazine for SBP >160  Obesity -BMI 36.61 -Weight loss should be encouraged -Outpatient PCP/bariatric medicine/bariatric surgery f/u encouraged    DVT prophylaxis:  Lovenox  Code Status:  Full -  confirmed with patient Family Communication: None present; her daughter is also being treated in the ER at this time Disposition Plan:  Home once clinically improved Consults called: None  Admission status: Admit - It is my clinical opinion that admission to INPATIENT is reasonable and necessary because  of the expectation that this patient will require hospital care that crosses at least 2 midnights to treat this condition based on the medical complexity of the problems presented.  Given the aforementioned information, the predictability of an adverse outcome is felt to be significant.      Karmen Bongo MD [facility]   How to contact the [facility] Attending or Consulting provider [place] or covering provider during after hours [place], for this patient?  1. Check the care team in [facility] and look for a) attending/consulting [facility] provider listed and b) the [facility] team listed 2. Log into [email or web address] and use [place]'s universal password to access. If you do not have the password, please contact the hospital operator. 3. Locate the [facility] provider you are looking for under [facility] and page to a number that you can be directly reached. 4. If you still have difficulty reaching the provider, please page the [facility] (Director on Call) for the Hospitalists listed on amion for assistance.   10/27/2018, 6:30 PM

## 2018-11-12 NOTE — Progress Notes (Signed)
Pharmacy consulted for Remdesivir dosing for a COVID Positive patient currently on 6L of oxygen sating at 89% .   Patient meets all criteria to receive Remdesivir:ALT <220 + hypoxemia < 94% -Of note ALT is elevated at 144  Plan Patient will be transferred to [place] and first dose of Remdesivir will be given there. -Remdesivir 200 mg IV 1x followed by 100 mg IV daily for 4 days -Monitor LFTs and clinical improvement   Lorel Monaco, PharmD PGY1 Ambulatory Care Resident [employer] # [phone]

## 2018-11-12 NOTE — ED Provider Notes (Addendum)
[facility] EMERGENCY DEPARTMENT Provider Note   CSN: [id number] Arrival date & time: 10/20/2018  1314     History   Chief Complaint No chief complaint on file.   HPI Shelley Allison is a 63 y.o. female.     Pt presents to the ED today with n/v/d and sob.  Pt has been sick for about 1 week.  She either has diarrhea or vomiting when she tries to eat.  She had an O2 sat of 68% or RA when she moved from the wheelchair to the bed.  O2 sats now in the 90s on 6L.  Pt denies any known Covid contacts.  Pt has had intermittent fevers.  She has not taken anything for fever today.     Past Medical History:  Diagnosis Date  . Cerebral aneurysm rupture (HCC) 1999  . Hypertension     Patient Active Problem List   Diagnosis Date Noted  . Conjunctivitis 11/24/2015  . Sciatica of right side 05/15/2015  . Degenerative joint disease 04/15/2015  . Healthcare maintenance 04/25/2014  . Cholecystitis, acute 04/08/2014  . CEREBRAL ANEURYSM, RUPTURED 10/11/2006  . OBESITY, NOS 04/13/2006  . HYPERTENSION, BENIGN SYSTEMIC 04/13/2006    Past Surgical History:  Procedure Laterality Date  . BRAIN SURGERY    . CESAREAN SECTION    . CHOLECYSTECTOMY N/A 04/07/2014   Procedure: LAPAROSCOPIC CHOLECYSTECTOMY;  Surgeon: Frederik Schmidt, MD;  Location: [facility] OR;  Service: General;  Laterality: N/A;  . HERNIA REPAIR       OB History   No obstetric history on file.      Home Medications    Prior to Admission medications   Medication Sig Start Date End Date Taking? Authorizing Provider  acetaminophen (TYLENOL) 500 MG tablet Take 1,000 mg by mouth every 6 (six) hours as needed for mild pain or headache.   Yes [provider]  amLODipine (NORVASC) 5 MG tablet TAKE 1 TABLET(5 MG) BY MOUTH DAILY Patient taking differently: Take 5 mg by mouth daily. TAKE 1 TABLET(5 MG) BY MOUTH DAILY 11/01/18  Yes Anderson, Chelsey L, DO  EPINEPHrine (EPIPEN) 0.3 mg/0.3 mL DEVI Inject 0.3 mg into the  muscle as needed. for anaphylaxis    Yes [provider]  hydrochlorothiazide (HYDRODIURIL) 25 MG tablet TAKE 1 TABLET(25 MG) BY MOUTH DAILY Patient taking differently: Take 25 mg by mouth daily. TAKE 1 TABLET(25 MG) BY MOUTH DAILY 11/01/18  Yes Anderson, Chelsey L, DO  lisinopril (ZESTRIL) 10 MG tablet Take 1 tablet (10 mg total) by mouth daily. 11/01/18  Yes Anderson, Chelsey L, DO  meloxicam (MOBIC) 7.5 MG tablet Take 1 tablet (7.5 mg total) by mouth daily. Patient not taking: Reported on 10/17/2018 04/15/15   Erasmo Downer, MD  methocarbamol (ROBAXIN) 500 MG tablet Take 1-2 tablets (500-1,000 mg total) by mouth every 8 (eight) hours as needed for muscle spasms (and pain). Patient not taking: Reported on 11/03/2018 04/15/15   Erasmo Downer, MD  triamterene-hydrochlorothiazide (MAXZIDE) 75-50 MG tablet Take 1 tablet by mouth daily. Patient not taking: Reported on 10/29/2018 07/01/15   Erasmo Downer, MD    Family History Family History  Problem Relation Age of Onset  . Arthritis Mother     Social History Social History   Tobacco Use  . Smoking status: Former Smoker    Quit date: 12/18/1996    Years since quitting: 21.9  . Smokeless tobacco: Never Used  Substance Use Topics  . Alcohol use: No  Comment: occasional  . Drug use: No     Allergies   Fish allergy, Other, Peanuts [peanut oil], and Shellfish allergy   Review of Systems Review of Systems  Respiratory: Positive for shortness of breath.   Gastrointestinal: Positive for abdominal pain, diarrhea, nausea and vomiting.  All other systems reviewed and are negative.    Physical Exam Updated Vital Signs BP (!) 168/88   Pulse 78   Temp 99.4 F (37.4 C) (Oral)   Resp (!) 33   Ht 5\' 5"  (1.651 m)   Wt 99.8 kg   SpO2 91%   BMI 36.61 kg/m   Physical Exam Vitals signs and nursing note reviewed.  Constitutional:      Appearance: Normal appearance.  HENT:     Head: Normocephalic and  atraumatic.     Right Ear: External ear normal.     Left Ear: External ear normal.     Nose: Nose normal.     Mouth/Throat:     Mouth: Mucous membranes are dry.  Eyes:     Extraocular Movements: Extraocular movements intact.     Conjunctiva/sclera: Conjunctivae normal.     Pupils: Pupils are equal, round, and reactive to light.  Neck:     Musculoskeletal: Normal range of motion and neck supple.  Cardiovascular:     Rate and Rhythm: Normal rate.     Pulses: Normal pulses.     Heart sounds: Normal heart sounds.  Pulmonary:     Effort: Tachypnea present.  Abdominal:     General: Abdomen is flat. Bowel sounds are normal.     Palpations: Abdomen is soft.  Musculoskeletal: Normal range of motion.  Skin:    General: Skin is warm.     Capillary Refill: Capillary refill takes less than 2 seconds.  Neurological:     General: No focal deficit present.     Mental Status: She is alert and oriented to person, place, and time.  Psychiatric:        Mood and Affect: Mood normal.        Behavior: Behavior normal.        Thought Content: Thought content normal.        Judgment: Judgment normal.      ED Treatments / Results  Labs (all labs ordered are listed, but only abnormal results are displayed) Labs Reviewed  SARS CORONAVIRUS 2 (HOSPITAL ORDER, PERFORMED IN [facility]) - Abnormal; Notable for the following components:      Result Value   SARS Coronavirus 2 POSITIVE (*)    All other components within normal limits  COMPREHENSIVE METABOLIC PANEL - Abnormal; Notable for the following components:   Potassium 3.2 (*)    CO2 21 (*)    Glucose, Bld 130 (*)    BUN 25 (*)    Creatinine, Ser 1.43 (*)    Calcium 8.6 (*)    Albumin 2.7 (*)    AST 226 (*)    ALT 144 (*)    GFR calc non Af Amer 39 (*)    GFR calc Af Amer 45 (*)    All other components within normal limits  CBC WITH DIFFERENTIAL/PLATELET  BRAIN NATRIURETIC PEPTIDE  URINALYSIS, ROUTINE W REFLEX MICROSCOPIC   TROPONIN I (HIGH SENSITIVITY)  TROPONIN I (HIGH SENSITIVITY)    EKG EKG Interpretation  Date/Time:  Monday November 12 2018 13:37:51 EDT Ventricular Rate:  91 PR Interval:    QRS Duration: 79 QT Interval:  386 QTC Calculation: 475 R  Axis:   2 Text Interpretation:  Sinus rhythm Consider right atrial enlargement Borderline repolarization abnormality No significant change since last tracing Confirmed by Isla Pence [phone]) on 10/31/2018 1:46:54 PM   Radiology Dg Chest Port 1 View  Result Date: 10/30/2018 CLINICAL DATA:  Shortness of breath.  Decreased oxygen saturation EXAM: PORTABLE CHEST 1 VIEW COMPARISON:  April 06, 2014 FINDINGS: There is fine reticular interstitial prominence throughout the lungs. There is atelectatic change in the left base. Heart is upper normal in size with pulmonary vascularity normal. No adenopathy. No bone lesions. 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 There is atelectatic change in the left base. Early pneumonia in the left base cannot be excluded. Heart upper normal in size.  No adenopathy evident. Electronically Signed   By: Lowella Grip III M.D.   On: 11/08/2018 13:58    Procedures Procedures (including critical care time)  Medications Ordered in ED Medications  azithromycin (ZITHROMAX) 500 mg in sodium chloride 0.9 % 250 mL IVPB (has no administration in time range)  sodium chloride 0.9 % bolus 1,000 mL (1,000 mLs Intravenous New Bag/Given 10/16/2018 1354)  morphine 4 MG/ML injection 4 mg (4 mg Intravenous Given 10/17/2018 1352)  ondansetron (ZOFRAN) injection 4 mg (4 mg Intravenous Given 11/09/2018 1352)  dexamethasone (DECADRON) injection 10 mg (10 mg Intravenous Given 11/01/2018 1434)  cefTRIAXone (ROCEPHIN) 1 g in sodium chloride 0.9 % 100 mL IVPB (0 g Intravenous Stopped 10/31/2018 1517)     Initial Impression / Assessment and Plan / ED Course  I have reviewed the triage vital signs and the nursing  notes.  Pertinent labs & imaging results that were available during my care of the patient were reviewed by me and considered in my medical decision making (see chart for details).      Pt placed on 5L of oxygen by [place] and O2 sats are in the low 90s.  She looks much more comfortable. Covid + She was given IVFs and decadron, rocephin, zithromax.  Pt's daughter is sick and waiting to be seen, so pt likely got covid from her, although that is unclear.  Pt d/w Dr. Lorin Mercy ([facility]) for admission.  CRITICAL CARE Performed by: Isla Pence   Total critical care time: 30 minutes  Critical care time was exclusive of separately billable procedures and treating other patients.  Critical care was necessary to treat or prevent imminent or life-threatening deterioration.  Critical care was time spent personally by me on the following activities: development of treatment plan with patient and/or surrogate as well as nursing, discussions with consultants, evaluation of patient's response to treatment, examination of patient, obtaining history from patient or surrogate, ordering and performing treatments and interventions, ordering and review of laboratory studies, ordering and review of radiographic studies, pulse oximetry and re-evaluation of patient's condition.  VERLIN DUKE was evaluated in Emergency Department on 10/23/2018 for the symptoms described in the history of present illness. She was evaluated in the context of the global COVID-19 pandemic, which necessitated consideration that the patient might be at risk for infection with the SARS-CoV-2 virus that causes COVID-19. Institutional protocols and algorithms that pertain to the evaluation of patients at risk for COVID-19 are in a state of rapid change based on information released by regulatory bodies including the CDC and federal and state organizations. These policies and algorithms were followed during the patient's care in the ED.  Final  Clinical Impressions(s) / ED Diagnoses   Final diagnoses:  Pneumonia due to  COVID-19 virus  Acute respiratory failure with hypoxia (HCC)  Gastroenteritis due to COVID-19 virus  Acute respiratory disease due to COVID-19 virus    ED Discharge Orders    None       Jacalyn LefevreHaviland, Lajeana Strough, MD 11-13-2018 1533    Jacalyn LefevreHaviland, Jamelia Varano, MD 11-13-2018 [phone]

## 2018-11-13 LAB — CBC WITH DIFFERENTIAL/PLATELET
Abs Immature Granulocytes: 0.05 10*3/uL (ref 0.00–0.07)
Basophils Absolute: 0 10*3/uL (ref 0.0–0.1)
Basophils Relative: 0 %
Eosinophils Absolute: 0 10*3/uL (ref 0.0–0.5)
Eosinophils Relative: 0 %
HCT: 39.8 % (ref 36.0–46.0)
Hemoglobin: 12.4 g/dL (ref 12.0–15.0)
Immature Granulocytes: 1 %
Lymphocytes Relative: 11 %
Lymphs Abs: 0.8 10*3/uL (ref 0.7–4.0)
MCH: 27.4 pg (ref 26.0–34.0)
MCHC: 31.2 g/dL (ref 30.0–36.0)
MCV: 88.1 fL (ref 80.0–100.0)
Monocytes Absolute: 0.3 10*3/uL (ref 0.1–1.0)
Monocytes Relative: 5 %
Neutro Abs: 5.9 10*3/uL (ref 1.7–7.7)
Neutrophils Relative %: 83 %
Platelets: 370 10*3/uL (ref 150–400)
RBC: 4.52 MIL/uL (ref 3.87–5.11)
RDW: 14.2 % (ref 11.5–15.5)
WBC: 7.1 10*3/uL (ref 4.0–10.5)
nRBC: 0 % (ref 0.0–0.2)

## 2018-11-13 LAB — COMPREHENSIVE METABOLIC PANEL
ALT: 129 U/L — ABNORMAL HIGH (ref 0–44)
AST: 139 U/L — ABNORMAL HIGH (ref 15–41)
Albumin: 2.7 g/dL — ABNORMAL LOW (ref 3.5–5.0)
Alkaline Phosphatase: 64 U/L (ref 38–126)
Anion gap: 12 (ref 5–15)
BUN: 20 mg/dL (ref 8–23)
CO2: 21 mmol/L — ABNORMAL LOW (ref 22–32)
Calcium: 8.3 mg/dL — ABNORMAL LOW (ref 8.9–10.3)
Chloride: 109 mmol/L (ref 98–111)
Creatinine, Ser: 0.96 mg/dL (ref 0.44–1.00)
GFR calc Af Amer: >60 mL/min (ref >60)
GFR calc non Af Amer: >60 mL/min (ref >60)
Glucose, Bld: 140 mg/dL — ABNORMAL HIGH (ref 70–99)
Potassium: 4.1 mmol/L (ref 3.5–5.1)
Sodium: 142 mmol/L (ref 135–145)
Total Bilirubin: 0.6 mg/dL (ref 0.3–1.2)
Total Protein: 7.5 g/dL (ref 6.5–8.1)

## 2018-11-13 LAB — C-REACTIVE PROTEIN: CRP: 17 mg/dL — ABNORMAL HIGH (ref <1.0)

## 2018-11-13 LAB — FERRITIN: Ferritin: 1372 ng/mL — ABNORMAL HIGH (ref 11–307)

## 2018-11-13 LAB — PHOSPHORUS: Phosphorus: 2.1 mg/dL — ABNORMAL LOW (ref 2.5–4.6)

## 2018-11-13 LAB — D-DIMER, QUANTITATIVE: D-Dimer, Quant: 4.49 ug/mL-FEU — ABNORMAL HIGH (ref 0.00–0.50)

## 2018-11-13 LAB — MAGNESIUM: Magnesium: 2.3 mg/dL (ref 1.7–2.4)

## 2018-11-13 LAB — HIV ANTIBODY (ROUTINE TESTING W REFLEX): HIV Screen 4th Generation wRfx: NONREACTIVE

## 2018-11-13 MED ORDER — TOCILIZUMAB 400 MG/20ML IV SOLN
800.0000 mg | Freq: Once | INTRAVENOUS | Status: AC
  Administered 2018-11-13: 800 mg via INTRAVENOUS
  Filled 2018-11-13: qty 40

## 2018-11-13 MED ORDER — AMLODIPINE BESYLATE 5 MG PO TABS
10.0000 mg | ORAL_TABLET | Freq: Every day | ORAL | Status: DC
  Administered 2018-11-14 – 2018-11-22 (×9): 10 mg via ORAL
  Filled 2018-11-13 (×9): qty 2

## 2018-11-13 NOTE — Progress Notes (Signed)
BP 180/91, Hr 93. Patient denies HA or pain.  Given 5mg  IV hydralazine per prn order for SBP > 149mmhg.  Will continue to monitor.

## 2018-11-13 NOTE — Progress Notes (Signed)
PROGRESS NOTE    Shelley Allison  IRC:[id number] DOB: 28-Apr-1955 DOA: 10/22/2018 PCP: Richarda Osmond, DO   Brief Narrative:  Shelley Allison is a 63 y.o. female with medical history significant of HTN and cerebral aneurysmal rupture presenting with GI symptoms and SOB.  She reports about a week of n/v/d, inability to keep anything down.  She began about 4 days ago with cough productive of yellowish sputum.  She became SOB over the weekend.  + fever, 100+.  She spends a lot of time with her daughter, who works on campus (not remote) at [employer] and who also attended a wedding with about 150 participants in mid-late September; her daughter is in the waiting room with similar symptoms that started about the same time. In ED patient noted to be COVID + patient.  N/V/D, O2 68% on RA.  Placed on 6L, up to 91%.  Now on 5L.   Assessment & Plan:   Principal Problem:   Respiratory tract infection due to COVID-19 virus Active Problems:   OBESITY, NOS   HYPERTENSION, BENIGN SYSTEMIC   Acute respiratory failure with hypoxia in the setting of COVID 19 PNA, POA -Patient with presenting with SOB and n/v/d; also with cough productive of yellow sputum -Continues to require 5 to 6 L nasal cannula to maintain sats above 90% -Chest x-ray concerning for focal opacities likely in the setting of above -Continue Remdesivir -stop date 11/16/2018 -Continue steroids x10 days, stop date 11/21/2018 -Actemra given x1 on 11/13/18 given elevated CRP, negative procalcitonin and worsening symptoms -Elevated LFTs, follow daily given Remdesivir administration Recent Labs    10/20/2018 1606 11/13/18 0255  DDIMER 2.93* 4.49*  FERRITIN 1,828* 1,372*  LDH 624*  --   CRP 20.0* 17.0*   Elevated AST/ALT improving -Questionably hypovolemic in nature -Downtrending appropriately with IV fluids and increased p.o. intake -Follow closely given Remdesivir administration  AKI without history of CKD, POA, improving -Prior  creatinine 0.76 in 3/17, now 1.43 -Continue to hold HCTZ and Lisinopril -Avoid nephrotoxic medications -Likely in the setting of poor p.o. intake given COVID-19 infection as above  HTN essential, currently uncontrolled -Continue Norvasc -creased dose to 10 mg daily given uncontrolled blood pressure off HCTZ lisinopril -Hold HCTZ and Lisinopril for now given AKI as above -Will use prn IV hydralazine for SBP >160  Obesity -BMI 36.61 -Weight loss/dietary and lifestyle changes encouraged  DVT prophylaxis:  Lovenox  Code Status:  Full - confirmed at bedside Disposition Plan:  Home once clinically improved Consults called: None  Admission status:  Patient remains inpatient needing supplemental oxygen well above baseline, IV fluids, IV antivirals in the setting of COVID-19 positive pneumonia.  Subjective: No acute issues or events overnight, patient indicates respiratory status appears to be improving but not yet back to baseline.  Markedly dyspneic with even minimal exertion today.  Patient otherwise denies chest pain, fevers, chills, headache, nausea, vomiting, diarrhea, constipation.  Objective: Vitals:   11/13/18 0500 11/13/18 0751 11/13/18 0941 11/13/18 1225  BP:  (!) 175/91 (!) 167/84 (!) 180/91  Pulse:  87  93  Resp: (!) 28 (!) 34  (!) 41  Temp:  98.3 F (36.8 C) 98.3 F (36.8 C) 98.3 F (36.8 C)  TempSrc:  Oral Oral Oral  SpO2:  (!) 88%  (!) 89%  Weight:      Height:        Intake/Output Summary (Last 24 hours) at 11/13/2018 1234 Last data filed at 11/13/2018 1200 Gross per 24  hour  Intake 1362.05 ml  Output 500 ml  Net 862.05 ml   Filed Weights   10/20/2018 1341  Weight: 99.8 kg    Examination:  General:  Pleasantly resting in bed, No acute distress. HEENT:  Normocephalic atraumatic.  Sclerae nonicteric, noninjected.  Extraocular movements intact bilaterally. Neck:  Without mass or deformity.  Trachea is midline. Lungs:  Clear to auscultate bilaterally without  rhonchi, wheeze, or rales. Heart:  Regular rate and rhythm.  Without murmurs, rubs, or gallops. Abdomen:  Soft, nontender, nondistended.  Without guarding or rebound. Extremities: Without cyanosis, clubbing, edema, or obvious deformity. Vascular:  Dorsalis pedis and posterior tibial pulses palpable bilaterally. Skin:  Warm and dry, no erythema, no ulcerations.  Data Reviewed: I have personally reviewed following labs and imaging studies  CBC: Recent Labs  Lab 11/11/2018 1343 11/13/18 0255  WBC 7.4 7.1  NEUTROABS 6.4 5.9  HGB 13.5 12.4  HCT 40.9 39.8  MCV 87.4 88.1  PLT 339 370   Basic Metabolic Panel: Recent Labs  Lab 11/02/2018 1343 11/13/18 0255  NA 139 142  K 3.2* 4.1  CL 106 109  CO2 21* 21*  GLUCOSE 130* 140*  BUN 25* 20  CREATININE 1.43* 0.96  CALCIUM 8.6* 8.3*  MG  --  2.3  PHOS  --  2.1*   GFR: Estimated Creatinine Clearance: 70.2 mL/min (by C-G formula based on SCr of 0.96 mg/dL). Liver Function Tests: Recent Labs  Lab 10/21/2018 1343 11/13/18 0255  AST 226* 139*  ALT 144* 129*  ALKPHOS 70 64  BILITOT 1.1 0.6  PROT 7.9 7.5  ALBUMIN 2.7* 2.7*   Anemia Panel: Recent Labs    11/02/2018 1606 11/13/18 0255  FERRITIN 1,828* 1,372*   Sepsis Labs: Recent Labs  Lab 11/03/2018 1606  PROCALCITON 0.17    Recent Results (from the past 240 hour(s))  SARS Coronavirus 2 [facility](Hospital order, Performed in [facility] lab) Nasopharyngeal Nasopharyngeal Swab     Status: Abnormal   Collection Time: 11/08/2018  1:43 PM   Specimen: Nasopharyngeal Swab  Result Value Ref Range Status   SARS Coronavirus 2 POSITIVE (A) NEGATIVE Final    Comment: RESULT CALLED TO, READ BACK BY AND VERIFIED WITH: Higinio RogerM. Barber RN 15:15 11/04/2018 ([email or web address]) (NOTE) If result is NEGATIVE SARS-CoV-2 target nucleic acids are NOT DETECTED. The SARS-CoV-2 RNA is generally detectable in upper and lower  respiratory specimens during the acute phase of infection. The lowest  concentration of  SARS-CoV-2 viral copies this assay can detect is 250  copies / mL. A negative result does not preclude SARS-CoV-2 infection  and should not be used as the sole basis for treatment or other  patient management decisions.  A negative result may occur with  improper specimen collection / handling, submission of specimen other  than nasopharyngeal swab, presence of viral mutation(s) within the  areas targeted by this assay, and inadequate number of viral copies  (<250 copies / mL). A negative result must be combined with clinical  observations, patient history, and epidemiological information. If result is POSITIVE SARS-CoV-2 target nucleic acids are DETECTED.  The SARS-CoV-2 RNA is generally detectable in upper and lower  respiratory specimens during the acute phase of infection.  Positive  results are indicative of active infection with SARS-CoV-2.  Clinical  correlation with patient history and other diagnostic information is  necessary to determine patient infection status.  Positive results do  not rule out bacterial infection or co-infection with other viruses. If result is PRESUMPTIVE  POSTIVE SARS-CoV-2 nucleic acids MAY BE PRESENT.   A presumptive positive result was obtained on the submitted specimen  and confirmed on repeat testing.  While 2019 novel coronavirus  (SARS-CoV-2) nucleic acids may be present in the submitted sample  additional confirmatory testing may be necessary for epidemiological  and / or clinical management purposes  to differentiate between  SARS-CoV-2 and other Sarbecovirus currently known to infect humans.  If clinically indicated additional testing with an alternate test  methodology [phone]) i s advised. The SARS-CoV-2 RNA is generally  detectable in upper and lower respiratory specimens during the acute  phase of infection. The expected result is Negative. Fact Sheet for Patients:  BoilerBrush.com.cy Fact Sheet for Healthcare  Providers: https://pope.com/ This test is not yet approved or cleared by the [place] FDA and has been authorized for detection and/or diagnosis of SARS-CoV-2 by FDA under an Emergency Use Authorization (EUA).  This EUA will remain in effect (meaning this test can be used) for the duration of the COVID-19 declaration under Section 564(b)(1) of the Act, 21 U.S.C. section 360bbb-3(b)(1), unless the authorization is terminated or revoked sooner. Performed at [facility], [address] [place], [place] [address]          Radiology Studies: Dg Chest Port 1 View  Result Date: 11/22/2018 CLINICAL DATA:  Shortness of breath.  Decreased oxygen saturation EXAM: PORTABLE CHEST 1 VIEW COMPARISON:  April 06, 2014 FINDINGS: There is fine reticular interstitial prominence throughout the lungs. There is atelectatic change in the left base. Heart is upper normal in size with pulmonary vascularity normal. No adenopathy. No bone lesions. 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 There is atelectatic change in the left base. Early pneumonia in the left base cannot be excluded. Heart upper normal in size.  No adenopathy evident. Electronically Signed   By: Bretta Bang III M.D.   On: November 22, 2018 13:58    Scheduled Meds: . [START ON 11/14/2018] amLODipine  10 mg Oral Daily  . enoxaparin (LOVENOX) injection  40 mg Subcutaneous Q24H  . methylPREDNISolone (SOLU-MEDROL) injection  60 mg Intravenous Q12H  . sodium chloride flush  3 mL Intravenous Q12H   Continuous Infusions: . sodium chloride Stopped (11/13/18 1159)  . 0.9 % NaCl with KCl 20 mEq / L 100 mL/hr at 11/13/18 0938  . azithromycin (ZITHROMAX) 500 MG IVPB (Vial-Mate [employer])    . cefTRIAXone (ROCEPHIN)  IV 200 mL/hr at 11/13/18 1200  . remdesivir 100 mg in NS 250 mL    . tocilizumab (ACTEMRA) IV       LOS: 1 day   Time spent:  Azucena Fallen, DO  [facility]  If 7PM-7AM, please contact night-coverage [email or web address] Password [facility] 11/13/2018, 12:34 PM

## 2018-11-13 NOTE — Plan of Care (Addendum)
Patient to remain free from falls and injury and VS stable throughout shift. 0430 Pt MEWS score 3. Pt adjusting in bed while taking VS. Movement causing SOB 15L HFNC Will continue to monitor

## 2018-11-13 NOTE — Progress Notes (Signed)
MEWS/VS Documentation      11/13/2018 0500 11/13/2018 0700 11/13/2018 0751 11/13/2018 0941   MEWS Score:  2  2  2  2    MEWS Score Color:  Yellow  Yellow  Yellow  Yellow   Resp:  (!) 28  -  (!) 34  -   Pulse:  -  -  87  -   BP:  -  -  (!) 175/91  (!) 167/84   Temp:  -  -  98.3 F (36.8 C)  98.3 F (36.8 C)   O2 Device:  -  -  HFNC  -   O2 Flow Rate (L/min):  -  -  15 L/min  10 L/min    Yellow Mews noted. This is no acute change for patient. HTN is due to patient is currently not on all BP home meds. MD is aware. High RR has maintained since admission but patient is consistently using IS and MD decrease O2 to 10L and tolerating well. Charge nurse aware.

## 2018-11-13 NOTE — Progress Notes (Signed)
BP 183/94, HR 88. IV hydralazine 5mg  IV given per PRN order.  Dr. Avon Gully notified. Will continue to monitor.

## 2018-11-14 DIAGNOSIS — I10 Essential (primary) hypertension: Secondary | ICD-10-CM

## 2018-11-14 LAB — CBC WITH DIFFERENTIAL/PLATELET
Abs Immature Granulocytes: 0.09 10*3/uL — ABNORMAL HIGH (ref 0.00–0.07)
Basophils Absolute: 0 10*3/uL (ref 0.0–0.1)
Basophils Relative: 0 %
Eosinophils Absolute: 0 10*3/uL (ref 0.0–0.5)
Eosinophils Relative: 0 %
HCT: 38.8 % (ref 36.0–46.0)
Hemoglobin: 12 g/dL (ref 12.0–15.0)
Immature Granulocytes: 1 %
Lymphocytes Relative: 10 %
Lymphs Abs: 1.1 10*3/uL (ref 0.7–4.0)
MCH: 27.5 pg (ref 26.0–34.0)
MCHC: 30.9 g/dL (ref 30.0–36.0)
MCV: 89 fL (ref 80.0–100.0)
Monocytes Absolute: 0.7 10*3/uL (ref 0.1–1.0)
Monocytes Relative: 6 %
Neutro Abs: 9.2 10*3/uL — ABNORMAL HIGH (ref 1.7–7.7)
Neutrophils Relative %: 83 %
Platelets: 412 10*3/uL — ABNORMAL HIGH (ref 150–400)
RBC: 4.36 MIL/uL (ref 3.87–5.11)
RDW: 14.3 % (ref 11.5–15.5)
WBC: 11.1 10*3/uL — ABNORMAL HIGH (ref 4.0–10.5)
nRBC: 0 % (ref 0.0–0.2)

## 2018-11-14 LAB — COMPREHENSIVE METABOLIC PANEL
ALT: 104 U/L — ABNORMAL HIGH (ref 0–44)
AST: 62 U/L — ABNORMAL HIGH (ref 15–41)
Albumin: 2.7 g/dL — ABNORMAL LOW (ref 3.5–5.0)
Alkaline Phosphatase: 61 U/L (ref 38–126)
Anion gap: 7 (ref 5–15)
BUN: 21 mg/dL (ref 8–23)
CO2: 22 mmol/L (ref 22–32)
Calcium: 8.2 mg/dL — ABNORMAL LOW (ref 8.9–10.3)
Chloride: 113 mmol/L — ABNORMAL HIGH (ref 98–111)
Creatinine, Ser: 0.8 mg/dL (ref 0.44–1.00)
GFR calc Af Amer: >60 mL/min (ref >60)
GFR calc non Af Amer: >60 mL/min (ref >60)
Glucose, Bld: 133 mg/dL — ABNORMAL HIGH (ref 70–99)
Potassium: 4.3 mmol/L (ref 3.5–5.1)
Sodium: 142 mmol/L (ref 135–145)
Total Bilirubin: 0.7 mg/dL (ref 0.3–1.2)
Total Protein: 7 g/dL (ref 6.5–8.1)

## 2018-11-14 LAB — D-DIMER, QUANTITATIVE: D-Dimer, Quant: 6.72 ug/mL-FEU — ABNORMAL HIGH (ref 0.00–0.50)

## 2018-11-14 LAB — C-REACTIVE PROTEIN: CRP: 8.7 mg/dL — ABNORMAL HIGH (ref <1.0)

## 2018-11-14 LAB — FERRITIN: Ferritin: 758 ng/mL — ABNORMAL HIGH (ref 11–307)

## 2018-11-14 LAB — MAGNESIUM: Magnesium: 2.4 mg/dL (ref 1.7–2.4)

## 2018-11-14 LAB — PHOSPHORUS: Phosphorus: 2.4 mg/dL — ABNORMAL LOW (ref 2.5–4.6)

## 2018-11-14 MED ORDER — ENOXAPARIN SODIUM 100 MG/ML ~~LOC~~ SOLN
100.0000 mg | Freq: Two times a day (BID) | SUBCUTANEOUS | Status: DC
  Administered 2018-11-14 – 2018-11-17 (×5): 100 mg via SUBCUTANEOUS
  Filled 2018-11-14 (×12): qty 1

## 2018-11-14 MED ORDER — HYDRALAZINE HCL 25 MG PO TABS
25.0000 mg | ORAL_TABLET | Freq: Four times a day (QID) | ORAL | Status: DC
  Administered 2018-11-14 – 2018-11-15 (×5): 25 mg via ORAL
  Filled 2018-11-14 (×4): qty 1

## 2018-11-14 MED ORDER — HYDROCHLOROTHIAZIDE 25 MG PO TABS
25.0000 mg | ORAL_TABLET | Freq: Every day | ORAL | Status: DC
  Administered 2018-11-14 – 2018-11-16 (×3): 25 mg via ORAL
  Filled 2018-11-14 (×4): qty 1

## 2018-11-14 MED ORDER — HYDRALAZINE HCL 20 MG/ML IJ SOLN
10.0000 mg | INTRAMUSCULAR | Status: DC | PRN
  Administered 2018-11-14 – 2018-11-19 (×5): 10 mg via INTRAVENOUS
  Filled 2018-11-14 (×5): qty 1

## 2018-11-14 MED ORDER — CHLORHEXIDINE GLUCONATE CLOTH 2 % EX PADS
6.0000 | MEDICATED_PAD | Freq: Every day | CUTANEOUS | Status: DC
  Administered 2018-11-14 – 2018-12-13 (×27): 6 via TOPICAL

## 2018-11-14 NOTE — Progress Notes (Signed)
Called pt's mother, Corine, for pt update/status.  Answered questions to best of ability.  Updated on Tests - CT/LE Dopplers - awaiting time to go for tests.

## 2018-11-14 NOTE — Progress Notes (Addendum)
Education provided on prone position. Pt in prone position. Pt states she fells it is harder to breath laying prone but will continue until she cant tolerate. MD at bedside updated MD at bedside

## 2018-11-14 NOTE — Progress Notes (Addendum)
mews change to yellow rt/bp 192/100 gave PRN 5mg  hydralizing and scheduled po RR 40s sats 85-88 pt did just finished transfering to [facility] 5-7 min ago. 15 HFNC . notified MD Bonner Puna  [phone] pt stats still 85% applied NRB mask

## 2018-11-14 NOTE — Progress Notes (Signed)
New orders received.

## 2018-11-14 NOTE — Progress Notes (Signed)
PROGRESS NOTE  Shelley Allison  ZOX:[id number] DOB: 12-28-1955 DOA: 07/08/2018 PCP: Leeroy BockAnderson, Chelsey L, DO   Brief Narrative: Shelley Allison is a 63 y.o. female with a history of HTN and cerebral artery aneurysm rupture who presented to the ED 9/28 with about 1 week of N/V/D followed by progressive shortness of breath with associated fever starting 9/27. Symptoms started around the time of a large wedding she attended. She was found to be hypoxic requiring 6L O2 with positive SARS-CoV-2 testing, elevated inflammatory markers (CRP 20), and reticular CXR infiltrates, possibly left base infiltrate. [facility], started on steroids, and remdesivir 9/28, given tocilizumab on 9/29 when hypoxia worsened. D-dimer has risen from 2.93 to 6.72, so CTA chest is ordered. Due to progressive hypoxia and tachypnea, the patient will be transferred to the ICU 9/30.  Assessment & Plan: Principal Problem:   Respiratory tract infection due to COVID-19 virus Active Problems:   OBESITY, NOS   HYPERTENSION, BENIGN SYSTEMIC  Acute hypoxic respiratory failure due to covid-19 pneumonia: Remains tachypneic with significant hypoxia for several days, continues worsening.  - Continue remdesivir, steroids, s/p tocilizumab 9/29. Discussed CCP and its rationale, though patient would prefer to defer this for now. - Now with rising d-dimer despite reduction in other inflammatory markers, suspicion for PE. CTA chest not available until this afternoon, so, as discussed with the patient this morning, will give treatment-dose lovenox empirically while awaiting results.  - CT chest will also give insight into possibility of CAP which is felt less likely. - Transfer to ICU for heated high flow. This was discussed with the patient at length as well. She feels strongly against intubation, and reluctant for transfer to ICU initially but does agree to transfer with rationale including need for HHF.  - Continue airborne, contact  precautions. PPE including surgical gown, gloves, cap, shoe covers, and CAPR used during this encounter in a negative pressure room.  - Check daily labs: CBC w/diff, CMP, d-dimer, ferritin, CRP - Avoid NSAIDs - Recommend proning and aggressive use of incentive spirometry. Pt not comfortable with proning.  - Goals of care were discussed. Prognosis is guarded.   HTN: Remains elevated in light of holding ACEi and thiazide.  - Continue increased dose norvasc - Start scheduled po hydralazine - Restart home thiazide - Continue prn hydralazine with attention to the history of cerebral artery aneurysm.   AKI: Resolved.  - Ok to give contrast since risk < benefit. Will continue IVF for 10 more hours for this reason. - Monitor BMP  Obesity: BMI 36. Negative impact on prognosis  History of ruptured cerebral artery aneurysm: Control BP  DVT prophylaxis: Lovenox Code Status: Full Family Communication: None at bedside Disposition Plan: Transfer to ICU  Consultants:   PCCM  Procedures:   None  Antimicrobials:  Remdesivir  CTX/Azithromycin.    Subjective: Feels about the same as the past several days, still severely short of breath at rest, now having more difficulty catching breath and keeping O2 sats >90% despite 15LHFNC and 15L NRB. Started having yellow-pink sputum production over past couple days.  Objective: Vitals:   11/14/18 0859 11/14/18 0900 11/14/18 1000 11/14/18 1100  BP: (!) 182/92 (!) 182/97 (!) 153/81 (!) 165/84  Pulse: 95 96 98 (!) 106  Resp: (!) 36 (!) 38 (!) 41 (!) 27  Temp: 98.4 F (36.9 C)  98.6 F (37 C) 98.1 F (36.7 C)  TempSrc: Oral  Oral Oral  SpO2: (!) 88% (!) 86% Marland Kitchen(!)  85% 94%  Weight:      Height:        Intake/Output Summary (Last 24 hours) at 11/14/2018 1157 Last data filed at 11/14/2018 0816 Gross per 24 hour  Intake 2324.79 ml  Output 501 ml  Net 1823.79 ml   Filed Weights   10/24/2018 1341  Weight: 99.8 kg    Gen: 63 y.o. female in no  distress Pulm: Mildly labored, tachypneic, crackles bilaterally. CV: Regular borderline tachycardia. No murmur, rub, or gallop. No JVD, no pedal edema. GI: Abdomen soft, non-tender, non-distended, with normoactive bowel sounds. No organomegaly or masses felt. Ext: Warm, no deformities Skin: No rashes, lesions or ulcers Neuro: Alert and oriented. No focal neurological deficits. Psych: Judgement and insight appear normal. Mood & affect appropriate.   Data Reviewed: I have personally reviewed following labs and imaging studies  CBC: Recent Labs  Lab 11/07/2018 1343 11/13/18 0255 11/14/18 0435  WBC 7.4 7.1 11.1*  NEUTROABS 6.4 5.9 9.2*  HGB 13.5 12.4 12.0  HCT 40.9 39.8 38.8  MCV 87.4 88.1 89.0  PLT 339 370 412*   Basic Metabolic Panel: Recent Labs  Lab 10/18/2018 1343 11/13/18 0255 11/14/18 0435  NA 139 142 142  K 3.2* 4.1 4.3  CL 106 109 113*  CO2 21* 21* 22  GLUCOSE 130* 140* 133*  BUN 25* 20 21  CREATININE 1.43* 0.96 0.80  CALCIUM 8.6* 8.3* 8.2*  MG  --  2.3 2.4  PHOS  --  2.1* 2.4*   GFR: Estimated Creatinine Clearance: 84.2 mL/min (by C-G formula based on SCr of 0.8 mg/dL). Liver Function Tests: Recent Labs  Lab 10/25/2018 1343 11/13/18 0255 11/14/18 0435  AST 226* 139* 62*  ALT 144* 129* 104*  ALKPHOS 70 64 61  BILITOT 1.1 0.6 0.7  PROT 7.9 7.5 7.0  ALBUMIN 2.7* 2.7* 2.7*   No results for input(s): LIPASE, AMYLASE in the last 168 hours. No results for input(s): AMMONIA in the last 168 hours. Coagulation Profile: No results for input(s): INR, PROTIME in the last 168 hours. Cardiac Enzymes: No results for input(s): CKTOTAL, CKMB, CKMBINDEX, TROPONINI in the last 168 hours. BNP (last 3 results) No results for input(s): PROBNP in the last 8760 hours. HbA1C: No results for input(s): HGBA1C in the last 72 hours. CBG: No results for input(s): GLUCAP in the last 168 hours. Lipid Profile: No results for input(s): CHOL, HDL, LDLCALC, TRIG, CHOLHDL, LDLDIRECT in  the last 72 hours. Thyroid Function Tests: No results for input(s): TSH, T4TOTAL, FREET4, T3FREE, THYROIDAB in the last 72 hours. Anemia Panel: Recent Labs    11/13/18 0255 11/14/18 0435  FERRITIN 1,372* 758*   Urine analysis:    Component Value Date/Time   COLORURINE YELLOW 04/06/2014 1816   APPEARANCEUR CLEAR 04/06/2014 1816   LABSPEC 1.025 04/06/2014 1816   PHURINE 5.5 04/06/2014 1816   GLUCOSEU NEGATIVE 04/06/2014 1816   HGBUR NEGATIVE 04/06/2014 1816   HGBUR trace-intact 11/14/2006 0829   BILIRUBINUR SMALL (A) 04/06/2014 1816   KETONESUR NEGATIVE 04/06/2014 1816   PROTEINUR NEGATIVE 04/06/2014 1816   UROBILINOGEN 1.0 04/06/2014 1816   NITRITE NEGATIVE 04/06/2014 1816   LEUKOCYTESUR SMALL (A) 04/06/2014 1816   Recent Results (from the past 240 hour(s))  SARS Coronavirus 2 [facility] order, Performed in [facility] hospital lab) Nasopharyngeal Nasopharyngeal Swab     Status: Abnormal   Collection Time: 10/17/2018  1:43 PM   Specimen: Nasopharyngeal Swab  Result Value Ref Range Status   SARS Coronavirus 2 POSITIVE (A) NEGATIVE  Final    Comment: RESULT CALLED TO, READ BACK BY AND VERIFIED WITH: Ivin Poot RN 15:15 11/14/2018 ([email or web address]) (NOTE) If result is NEGATIVE SARS-CoV-2 target nucleic acids are NOT DETECTED. The SARS-CoV-2 RNA is generally detectable in upper and lower  respiratory specimens during the acute phase of infection. The lowest  concentration of SARS-CoV-2 viral copies this assay can detect is 250  copies / mL. A negative result does not preclude SARS-CoV-2 infection  and should not be used as the sole basis for treatment or other  patient management decisions.  A negative result may occur with  improper specimen collection / handling, submission of specimen other  than nasopharyngeal swab, presence of viral mutation(s) within the  areas targeted by this assay, and inadequate number of viral copies  (<250 copies / mL). A negative result must be combined with  clinical  observations, patient history, and epidemiological information. If result is POSITIVE SARS-CoV-2 target nucleic acids are DETECTED.  The SARS-CoV-2 RNA is generally detectable in upper and lower  respiratory specimens during the acute phase of infection.  Positive  results are indicative of active infection with SARS-CoV-2.  Clinical  correlation with patient history and other diagnostic information is  necessary to determine patient infection status.  Positive results do  not rule out bacterial infection or co-infection with other viruses. If result is PRESUMPTIVE POSTIVE SARS-CoV-2 nucleic acids MAY BE PRESENT.   A presumptive positive result was obtained on the submitted specimen  and confirmed on repeat testing.  While 2019 novel coronavirus  (SARS-CoV-2) nucleic acids may be present in the submitted sample  additional confirmatory testing may be necessary for epidemiological  and / or clinical management purposes  to differentiate between  SARS-CoV-2 and other Sarbecovirus currently known to infect humans.  If clinically indicated additional testing with an alternate test  methodology [phone]) i s advised. The SARS-CoV-2 RNA is generally  detectable in upper and lower respiratory specimens during the acute  phase of infection. The expected result is Negative. Fact Sheet for Patients:  StrictlyIdeas.no Fact Sheet for Healthcare Providers: BankingDealers.co.za This test is not yet approved or cleared by the [place] FDA and has been authorized for detection and/or diagnosis of SARS-CoV-2 by FDA under an Emergency Use Authorization (EUA).  This EUA will remain in effect (meaning this test can be used) for the duration of the COVID-19 declaration under Section 564(b)(1) of the Act, 21 U.S.C. section 360bbb-3(b)(1), unless the authorization is terminated or revoked sooner. Performed at [facility], [place]  [address], [place], [place] [address]       Radiology Studies: Dg Chest Port 1 View  Result Date: 11/04/2018 CLINICAL DATA:  Shortness of breath.  Decreased oxygen saturation EXAM: PORTABLE CHEST 1 VIEW COMPARISON:  April 06, 2014 FINDINGS: There is fine reticular interstitial prominence throughout the lungs. There is atelectatic change in the left base. Heart is upper normal in size with pulmonary vascularity normal. No adenopathy. No bone lesions. 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 There is atelectatic change in the left base. Early pneumonia in the left base cannot be excluded. Heart upper normal in size.  No adenopathy evident. Electronically Signed   By: Lowella Grip III M.D.   On: 11/07/2018 13:58    Scheduled Meds: . amLODipine  10 mg Oral Daily  . enoxaparin (LOVENOX) injection  100 mg Subcutaneous Q12H  . hydrALAZINE  25 mg Oral Q6H  . hydrochlorothiazide  25 mg Oral Daily  .  methylPREDNISolone (SOLU-MEDROL) injection  60 mg Intravenous Q12H  . sodium chloride flush  3 mL Intravenous Q12H   Continuous Infusions: . sodium chloride Stopped (11/13/18 1159)  . 0.9 % NaCl with KCl 20 mEq / L 100 mL/hr at 11/14/18 0115  . cefTRIAXone (ROCEPHIN)  IV Stopped (11/13/18 1238)  . remdesivir 100 mg in NS 250 mL 100 mg (11/13/18 1659)     LOS: 2 days   Time spent: 35 minutes.  Tyrone Nine, MD [facility] [email or web address] Password [email or web address] 11/14/2018, 11:57 AM

## 2018-11-14 NOTE — Progress Notes (Signed)
Paged Dr Shanon Brow, Hospitalist oncall, re: continued eleveated SBP after PRN dose of Hydralizine.  Awaiting call back or new orders.

## 2018-11-14 NOTE — Progress Notes (Signed)
Pt unable to tolerate prone position states " hard to breath on belly". Pt back to supine. Placed pt on NRB at 1145. If able to tolerate for 1 hour pt will transport for CT. sats 95%

## 2018-11-14 NOTE — Progress Notes (Signed)
Called pts mom updated on pt status, transfer to ICU and transfer to another facility for CT. Answered all questions. Mom would like a call back with results of CT

## 2018-11-15 ENCOUNTER — Inpatient Hospital Stay (HOSPITAL_COMMUNITY): Payer: BC Managed Care – PPO

## 2018-11-15 DIAGNOSIS — U071 COVID-19: Secondary | ICD-10-CM

## 2018-11-15 DIAGNOSIS — I10 Essential (primary) hypertension: Secondary | ICD-10-CM | POA: Diagnosis present

## 2018-11-15 DIAGNOSIS — J1289 Other viral pneumonia: Secondary | ICD-10-CM | POA: Diagnosis not present

## 2018-11-15 DIAGNOSIS — N179 Acute kidney failure, unspecified: Secondary | ICD-10-CM | POA: Diagnosis present

## 2018-11-15 DIAGNOSIS — J9601 Acute respiratory failure with hypoxia: Secondary | ICD-10-CM

## 2018-11-15 DIAGNOSIS — Z6841 Body Mass Index (BMI) 40.0 and over, adult: Secondary | ICD-10-CM

## 2018-11-15 DIAGNOSIS — R7989 Other specified abnormal findings of blood chemistry: Secondary | ICD-10-CM

## 2018-11-15 DIAGNOSIS — I2699 Other pulmonary embolism without acute cor pulmonale: Secondary | ICD-10-CM | POA: Diagnosis present

## 2018-11-15 LAB — COMPREHENSIVE METABOLIC PANEL
ALT: 73 U/L — ABNORMAL HIGH (ref 0–44)
AST: 38 U/L (ref 15–41)
Albumin: 2.6 g/dL — ABNORMAL LOW (ref 3.5–5.0)
Alkaline Phosphatase: 63 U/L (ref 38–126)
Anion gap: 8 (ref 5–15)
BUN: 16 mg/dL (ref 8–23)
CO2: 22 mmol/L (ref 22–32)
Calcium: 8 mg/dL — ABNORMAL LOW (ref 8.9–10.3)
Chloride: 110 mmol/L (ref 98–111)
Creatinine, Ser: 0.73 mg/dL (ref 0.44–1.00)
GFR calc Af Amer: >60 mL/min (ref >60)
GFR calc non Af Amer: >60 mL/min (ref >60)
Glucose, Bld: 140 mg/dL — ABNORMAL HIGH (ref 70–99)
Potassium: 4 mmol/L (ref 3.5–5.1)
Sodium: 140 mmol/L (ref 135–145)
Total Bilirubin: 0.5 mg/dL (ref 0.3–1.2)
Total Protein: 6.6 g/dL (ref 6.5–8.1)

## 2018-11-15 LAB — CBC WITH DIFFERENTIAL/PLATELET
Abs Immature Granulocytes: 0.11 10*3/uL — ABNORMAL HIGH (ref 0.00–0.07)
Basophils Absolute: 0 10*3/uL (ref 0.0–0.1)
Basophils Relative: 0 %
Eosinophils Absolute: 0 10*3/uL (ref 0.0–0.5)
Eosinophils Relative: 0 %
HCT: 37.3 % (ref 36.0–46.0)
Hemoglobin: 11.9 g/dL — ABNORMAL LOW (ref 12.0–15.0)
Immature Granulocytes: 1 %
Lymphocytes Relative: 8 %
Lymphs Abs: 0.9 10*3/uL (ref 0.7–4.0)
MCH: 27.9 pg (ref 26.0–34.0)
MCHC: 31.9 g/dL (ref 30.0–36.0)
MCV: 87.6 fL (ref 80.0–100.0)
Monocytes Absolute: 0.8 10*3/uL (ref 0.1–1.0)
Monocytes Relative: 7 %
Neutro Abs: 9.7 10*3/uL — ABNORMAL HIGH (ref 1.7–7.7)
Neutrophils Relative %: 84 %
Platelets: 341 10*3/uL (ref 150–400)
RBC: 4.26 MIL/uL (ref 3.87–5.11)
RDW: 14.3 % (ref 11.5–15.5)
WBC: 11.5 10*3/uL — ABNORMAL HIGH (ref 4.0–10.5)
nRBC: 0 % (ref 0.0–0.2)

## 2018-11-15 LAB — URINALYSIS, ROUTINE W REFLEX MICROSCOPIC
Bilirubin Urine: NEGATIVE
Glucose, UA: NEGATIVE mg/dL
Hgb urine dipstick: NEGATIVE
Ketones, ur: NEGATIVE mg/dL
Leukocytes,Ua: NEGATIVE
Nitrite: NEGATIVE
Protein, ur: NEGATIVE mg/dL
Specific Gravity, Urine: 1.018 (ref 1.005–1.030)
pH: 6 (ref 5.0–8.0)

## 2018-11-15 LAB — FERRITIN: Ferritin: 489 ng/mL — ABNORMAL HIGH (ref 11–307)

## 2018-11-15 LAB — PHOSPHORUS: Phosphorus: 2.2 mg/dL — ABNORMAL LOW (ref 2.5–4.6)

## 2018-11-15 LAB — C-REACTIVE PROTEIN: CRP: 4.5 mg/dL — ABNORMAL HIGH (ref <1.0)

## 2018-11-15 LAB — MAGNESIUM: Magnesium: 2.3 mg/dL (ref 1.7–2.4)

## 2018-11-15 LAB — D-DIMER, QUANTITATIVE: D-Dimer, Quant: 17.18 ug/mL-FEU — ABNORMAL HIGH (ref 0.00–0.50)

## 2018-11-15 MED ORDER — SODIUM CHLORIDE 0.9% FLUSH: 10.0000 mL | INTRAVENOUS | Status: DC | PRN

## 2018-11-15 MED ORDER — METOPROLOL TARTRATE 25 MG PO TABS
12.5000 mg | ORAL_TABLET | Freq: Two times a day (BID) | ORAL | Status: DC
  Administered 2018-11-15 – 2018-11-16 (×3): 12.5 mg via ORAL
  Filled 2018-11-15 (×3): qty 1

## 2018-11-15 MED ORDER — HYDRALAZINE HCL 50 MG PO TABS
50.0000 mg | ORAL_TABLET | Freq: Four times a day (QID) | ORAL | Status: DC
  Administered 2018-11-15 – 2018-11-19 (×16): 50 mg via ORAL
  Filled 2018-11-15: qty 2
  Filled 2018-11-15 (×2): qty 1
  Filled 2018-11-15 (×2): qty 2
  Filled 2018-11-15: qty 1
  Filled 2018-11-15: qty 2
  Filled 2018-11-15: qty 1
  Filled 2018-11-15: qty 2
  Filled 2018-11-15 (×2): qty 1
  Filled 2018-11-15: qty 2
  Filled 2018-11-15 (×4): qty 1

## 2018-11-15 NOTE — Plan of Care (Signed)
Pt rested some during shift.  RR upper 20s to mid-40s.  Pt dyspneic with activity - [facility].  Pt able to recovery from activity within 10 minutes.  Pt remains on NRB mask at 15 L.  Monitor showed SR to ST with occ PVCs.  IVF infusing with minimal UO.  Urine remains clear and yellow to straw color. Pt deniend any pain; focusing on breathing and keeping it under control. Problem: Clinical Measurements: Goal: Respiratory complications will improve 11/15/2018 0548 by Francesca Oman, RN Outcome: Not Progressing 11/15/2018 0548 by Francesca Oman, RN Outcome: Progressing

## 2018-11-15 NOTE — Consult Note (Addendum)
NAME:  TIANA SIVERTSON, MRN:  [id number], DOB:  July 25, 1955, LOS: 3 ADMISSION DATE:  11/13/2018, CONSULTATION DATE:  11/15/18 REFERRING MD:  Dr Jarvis Newcomer, CHIEF COMPLAINT:  covid-19 pna/acute hypoxemic resp failure  Brief History   63 yo aa female with pmh cerebral aneurysm rupture presented with covid-19 pna after attending wedding and with high risk contacts.   History of present illness   63 yo aa female with pmh HTN and cerebral aneurysm rupture presented to ED 9/28 after 1 week of n/v/d and progressive sob. 4 days prior to presentation she began having cough with yellowish sputum production. She stated that 9/27 the also began having fevers. She does endorse that the symptoms started at the time of attending a large wedding with approx 150 people but prior to that she was in contact with daughter who works in person on a college campus. She added that her daughter was also presenting with similar symptoms.   Pt was found to be covid positive and admitted for this. Pt was admitted to [facility] in the progressive care unit but unfortunately has worsened with increased wob and oxygen requirement. She was transferred to the ICU for progressive hypoxemic resp failure req HFNC/NRB. She has been started on remdesivir/steroids and on 9/29 given a dose of toci after appropriate consent by primary.   She denies any history of clotting (her mother did have one however). No h/o miscarriage. No long car trips or prolonged periods of immobility prior to her presentation here.   Past Medical History   Past Medical History:  Diagnosis Date  . Cerebral aneurysm rupture (HCC) 1999  . Hypertension     Significant Hospital Events   9/30: transferrred to ICU for progressive hypoxic resp failure  Consults:  CCM 10/1  Procedures:   Significant Diagnostic Tests:  10/1 le venous doppler:   Micro Data:  9/28 sars2: POSITIVE  Antimicrobials:  9/28 azithro -> 9/29 9/28 ceftriaxone->  9/28 remdesivir-> 9/29  tocilizumba  Interim history/subjective:  10/1: afebrile overnight. Remains on NRB. Labs pending resulting this am.   Objective   Blood pressure (!) 164/84, pulse 92, temperature 98.2 F (36.8 C), temperature source Oral, resp. rate (!) 26, height 5\' 5"  (1.651 m), weight 99.8 kg, SpO2 99 %.        Intake/Output Summary (Last 24 hours) at 11/15/2018 0755 Last data filed at 11/15/2018 0500 Gross per 24 hour  Intake 2965.62 ml  Output 900 ml  Net 2065.62 ml   Filed Weights   11/13/18 1341  Weight: 99.8 kg    Examination: General: no acute distress, sitting up in bed eating breakfast HEENT: NCAT, EOMI, PERRLA, MMMP Lungs: CTA, no wheezes, rhonchi, rales, diminished in bilateral bases Cardiovascular: RRR, no m/g/r Abdomen: soft, NT,ND, BS+ Extremities: + edema Skin: no rashes, warm and dry Neuro: moves all 4 extremities   Resolved Hospital Problem list   none  Assessment & Plan:  covid-19 pneumonia Acute hypoxemia resp failure Concern for acute PE -titrate oxygen -cont remdesivir for 5 days -s/p toci 9/29 -remains on ceftriaxone. May stop tomorrow if pct is stable to improved. Do not suspect superimposed baterial infection -daughter positive for covid as well. At home recovering.  -on solumedrol ~1mg /kg/day -pulm hygiene and recommend self proning as able -follow markers (downtrending ferritin/crp)  -ddimer elevated and cont to rise.  Pt placed on tx dose lovenox until ctpa can be completed.  -LE dopplers should be done as well.  -will order cxr for tomorrow, unless ct  scanner still down today then will change to today, esp in light of worsening resp failure (previous cxr from 9/28 with bibasilar infiltrate) -pt does want intubation if needed and remains full code   H/o htn H/o cerebral aneurysm rupture ARF: resolved Transaminitis: improving Best practice:  Diet: reg Pain/Anxiety/Delirium protocol (if indicated): n/a VAP protocol (if indicated): n/a DVT  prophylaxis: lovenox GI prophylaxis: n/a Glucose control: n/a Mobility: with assistance Code Status: FULL Family Communication: pt only Disposition: ICU  Labs   CBC: Recent Labs  Lab 2018/12/06 1343 11/13/18 0255 11/14/18 0435 11/15/18 0453  WBC 7.4 7.1 11.1* 11.5*  NEUTROABS 6.4 5.9 9.2* 9.7*  HGB 13.5 12.4 12.0 11.9*  HCT 40.9 39.8 38.8 37.3  MCV 87.4 88.1 89.0 87.6  PLT 339 370 412* 341    Basic Metabolic Panel: Recent Labs  Lab 2018/12/06 1343 11/13/18 0255 11/14/18 0435  NA 139 142 142  K 3.2* 4.1 4.3  CL 106 109 113*  CO2 21* 21* 22  GLUCOSE 130* 140* 133*  BUN 25* 20 21  CREATININE 1.43* 0.96 0.80  CALCIUM 8.6* 8.3* 8.2*  MG  --  2.3 2.4  PHOS  --  2.1* 2.4*   GFR: Estimated Creatinine Clearance: 84.2 mL/min (by C-G formula based on SCr of 0.8 mg/dL). Recent Labs  Lab 2018/12/06 1343 2018/12/06 1606 11/13/18 0255 11/14/18 0435 11/15/18 0453  PROCALCITON  --  0.17  --   --   --   WBC 7.4  --  7.1 11.1* 11.5*    Liver Function Tests: Recent Labs  Lab 2018/12/06 1343 11/13/18 0255 11/14/18 0435  AST 226* 139* 62*  ALT 144* 129* 104*  ALKPHOS 70 64 61  BILITOT 1.1 0.6 0.7  PROT 7.9 7.5 7.0  ALBUMIN 2.7* 2.7* 2.7*   No results for input(s): LIPASE, AMYLASE in the last 168 hours. No results for input(s): AMMONIA in the last 168 hours.  ABG No results found for: PHART, PCO2ART, PO2ART, HCO3, TCO2, ACIDBASEDEF, O2SAT   Coagulation Profile: No results for input(s): INR, PROTIME in the last 168 hours.  Cardiac Enzymes: No results for input(s): CKTOTAL, CKMB, CKMBINDEX, TROPONINI in the last 168 hours.  HbA1C: Hemoglobin A1C  Date/Time Value Ref Range Status  05/15/2015 03:52 PM 5.5  Final  04/25/2014 04:43 PM 5.8  Final    CBG: No results for input(s): GLUCAP in the last 168 hours.  Review of Systems:   + cough with yellow sputum, some sob with exertion. Otherwise negative at this time. (n/v/d improved/resolved)  Past Medical History   She,  has a past medical history of Cerebral aneurysm rupture (HCC) (1999) and Hypertension.   Surgical History    Past Surgical History:  Procedure Laterality Date  . BRAIN SURGERY    . CESAREAN SECTION    . CHOLECYSTECTOMY N/A 04/07/2014   Procedure: LAPAROSCOPIC CHOLECYSTECTOMY;  Surgeon: Frederik SchmidtJay Wyatt, MD;  [facility];  Service: General;  Laterality: N/A;  . HERNIA REPAIR       Social History   reports that she quit smoking about 21 years ago. Her smoking use included cigarettes. She has a 2.50 pack-year smoking history. She has never used smokeless tobacco. She reports that she does not drink alcohol or use drugs.   Family History   Her family history includes Arthritis in her mother.   Allergies Allergies  Allergen Reactions  . Fish Allergy Hives and Shortness Of Breath    All fish and fish derivatives  . Other Hives, Shortness  Of Breath and Rash    All Pitted Fruit  . Peanuts [Peanut Oil]   . Shellfish Allergy      Home Medications  Prior to Admission medications   Medication Sig Start Date End Date Taking? Authorizing Provider  acetaminophen (TYLENOL) 500 MG tablet Take 1,000 mg by mouth every 6 (six) hours as needed for mild pain or headache.   Yes [provider]  amLODipine (NORVASC) 5 MG tablet TAKE 1 TABLET(5 MG) BY MOUTH DAILY Patient taking differently: Take 5 mg by mouth daily. TAKE 1 TABLET(5 MG) BY MOUTH DAILY 11/01/18  Yes Anderson, Chelsey L, DO  EPINEPHrine (EPIPEN) 0.3 mg/0.3 mL DEVI Inject 0.3 mg into the muscle as needed. for anaphylaxis    Yes [provider]  hydrochlorothiazide (HYDRODIURIL) 25 MG tablet TAKE 1 TABLET(25 MG) BY MOUTH DAILY Patient taking differently: Take 25 mg by mouth daily. TAKE 1 TABLET(25 MG) BY MOUTH DAILY 11/01/18  Yes Anderson, Chelsey L, DO  lisinopril (ZESTRIL) 10 MG tablet Take 1 tablet (10 mg total) by mouth daily. 11/01/18  Yes Richarda Osmond, DO     Critical care time: The patient is critically  ill with multiple organ systems failure and requires high complexity decision making for assessment and support, frequent evaluation and titration of therapies, application of advanced monitoring technologies and extensive interpretation of multiple databases.  Critical care time 36 mins. This represents my time independent of the NP's/PA's/med students/residents time taking care of the pt. This is excluding procedures.     Audria Nine DO Pager: [phone] After hours pager: [phone]  [facility] 11/15/2018, 7:55 AM

## 2018-11-15 NOTE — Plan of Care (Signed)
Discussed with patient plan of care for the evening, breathing techniques, pain management and importance of using incentive spirometry with some teach back displayed

## 2018-11-15 NOTE — Progress Notes (Signed)
Assisted tele visit to patient with daughter.  Hawthorne Day P, RN  

## 2018-11-15 NOTE — Progress Notes (Signed)
Bilateral lower extremity venous duplex has been completed. Preliminary results can be found in CV Proc through chart review.  Results were given to the patient's nurse, Jenny Reichmann.  11/15/18 2:49 PM Carlos Levering RVT

## 2018-11-15 NOTE — Progress Notes (Signed)
Spoke to patient's daughter, Shelley Allison and gave her updates on patient's condition. Answered all questions regarding patient's condition. Laquisha requested face time call with patient this evening around 2030 . I told her I would pass information on to the night nurse so that she/he could get call set up through [facility].

## 2018-11-15 NOTE — Progress Notes (Signed)
Called and updated daughter with all questions answered at this time.  Gave daughter phone number to e-link for a face time call with the patient.

## 2018-11-15 NOTE — Progress Notes (Addendum)
PROGRESS NOTE    Shelley Allison  RUE:[id number] DOB: 1955/07/23 DOA: 10/17/2018 PCP: Shelley Bock, DO   Brief Narrative:  Shelley Allison is a 63 y.o. BF PMHx  HTN and cerebral artery aneurysm rupture   Presented to the ED 9/28 with about 1 week of N/V/D followed by progressive shortness of breath with associated fever starting 9/27. Symptoms started around the time of a large wedding she attended. She was found to be hypoxic requiring 6L O2 with positive SARS-CoV-2 testing, elevated inflammatory markers (CRP 20), and reticular CXR infiltrates, possibly left base infiltrate. She was admitted to [facility], started on steroids, and remdesivir 9/28, given tocilizumab on 9/29 when hypoxia worsened. D-dimer has risen from 2.93 to 6.72, so CTA chest is ordered. Due to progressive hypoxia and tachypnea, the patient will be transferred to the ICU 9/30.   Subjective: 10/1 A/O x4, follows all commands, negative CP, negative abdominal pain, negative N/V.  Positive S OB.   Assessment & Plan:   Principal Problem:   Respiratory tract infection due to COVID-19 virus Active Problems:   OBESITY, NOS   HYPERTENSION, BENIGN SYSTEMIC   CEREBRAL ANEURYSM, RUPTURED   Pneumonia due to COVID-19 virus   Acute respiratory failure with hypoxia (HCC)   Acute pulmonary embolus (HCC)   Essential hypertension   AKI (acute kidney injury) (HCC)   Morbid obesity with BMI of 70 and over, adult [facility])   COVID 19 pneumonia/acute respiratory failure with hypoxia Recent Labs  Lab 10/24/2018 1606 11/13/18 0255 11/14/18 0435 11/15/18 0453  CRP 20.0* 17.0* 8.7* 4.5*   Recent Labs  Lab 10/30/2018 1606 11/13/18 0255 11/14/18 0435 11/15/18 0453  DDIMER 2.93* 4.49* 6.72* 17.18*  - Solu-Medrol 60 mg BID - Remdesivir per pharmacy protocol - 9/29 Actemra x1 - CCP was discussed with patient who declined this treatment at this point. -HHFNC, titrate to maintain SPO2 88 to 93% - Prone patient PRN to maintain SPO2 88  to 93% -Strict in and out +5.1 L -Daily weight Filed Weights   10/17/2018 1341  Weight: 99.8 kg  - 10/2 PCXR pending  Acute pulmonary embolus/DVT - CTA chest pending (machine broken currently) - Continue treatment dose Lovenox - Bilateral lower extremity Doppler pending   Essential HTN - Amlodipine 10 mg daily - Hydralazine IV PRN - 10/1 increase Hydralazine 50 mg QID -Hydrochlorothiazide 25 mg daily - 10/1 start Metoprolol 12.5 mg BID  Acute kidney injury Recent Labs  Lab 11/10/2018 1343 11/13/18 0255 11/14/18 0435 11/15/18 0453  CREATININE 1.43* 0.96 0.80 0.73  -Resolved  Morbid obesity -BMI 36  -Risk factor for poor outcome to COVID 19 pneumonia   Hx Ruptured Cerebral Artery aneurysm -Obtain good blood pressure control: Goal BP= 120/80 -See essential HTN    DVT prophylaxis: Lovenox treatment dose Code Status: Full Family Communication: 10/1 spoke with Shelley Allison (mother) discussed plan of care and answered all questions Disposition Plan:    Consultants:  PCCM    Procedures/Significant Events:  9/28 PCXR; 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 There is atelectatic change in the left base. Early pneumonia in the left base cannot be excluded. -Heart upper normal in size.     I have personally reviewed and interpreted all radiology studies and my findings are as above.  VENTILATOR SETTINGS: HFNC Flow rate 13 L/min     Cultures 9/28 SARS coronavirus positive      Antimicrobials: Anti-infectives (From admission, onward)   Start     Stop   11/13/18  1600  remdesivir 100 mg in sodium chloride 0.9 % 250 mL IVPB     11/17/18 1559   11/13/18 1200  azithromycin (ZITHROMAX) 500 mg in sodium chloride 0.9 % 250 mL IVPB     11/13/18 1340   11/13/18 1200  cefTRIAXone (ROCEPHIN) 1 g in sodium chloride 0.9 % 100 mL IVPB         11/11/2018 2200  remdesivir 200 mg in sodium chloride 0.9 % 250 mL IVPB     10/20/2018 2213    10/17/2018 1415  cefTRIAXone (ROCEPHIN) 1 g in sodium chloride 0.9 % 100 mL IVPB     10/19/2018 1517   10/19/2018 1415  azithromycin (ZITHROMAX) 500 mg in sodium chloride 0.9 % 250 mL IVPB     11/07/2018 1736       Devices   LINES / TUBES:      Continuous Infusions:  sodium chloride Stopped (11/13/18 1159)   cefTRIAXone (ROCEPHIN)  IV 200 mL/hr at 11/14/18 2100   remdesivir 100 mg in NS 250 mL Stopped (11/14/18 1748)     Objective: Vitals:   11/15/18 0800 11/15/18 1137 11/15/18 1200 11/15/18 1514  BP:  (!) 164/87  (!) 173/94  Pulse:  97  93  Resp:  (!) 45  (!) 42  Temp: 98 F (36.7 C)  98 F (36.7 C)   TempSrc: Oral  Oral   SpO2:  94%  (!) 89%  Weight:      Height:        Intake/Output Summary (Last 24 hours) at 11/15/2018 1523 Last data filed at 11/15/2018 0500 Gross per 24 hour  Intake 2962.62 ml  Output 700 ml  Net 2262.62 ml   Filed Weights   11/07/2018 1341  Weight: 99.8 kg    Examination:  General: A/O x4, positive acute respiratory distress Eyes: negative scleral hemorrhage, negative anisocoria, negative icterus ENT: Negative Runny nose, negative gingival bleeding, Neck:  Negative scars, masses, torticollis, lymphadenopathy, JVD Lungs: Difficult to auscultate secondary to body habitus, but appears diffuse decreased breath sounds without wheezes or crackles Cardiovascular: Regular rate and rhythm without murmur gallop or rub normal S1 and S2 Abdomen: MORBIDLY OBESE, negative abdominal pain, nondistended, positive soft, bowel sounds, no rebound, no ascites, no appreciable mass Extremities: No significant cyanosis, clubbing, morbidly obese difficult to ascertain if any edema.   Skin: Negative rashes, lesions, ulcers Psychiatric:  Negative depression, negative anxiety, negative fatigue, negative mania  Central nervous system:  Cranial nerves II through XII intact, tongue/uvula midline, all extremities muscle strength 5/5, sensation intact throughout,  negative  dysarthria, negative expressive aphasia, negative receptive aphasia. .     Data Reviewed: Care during the described time interval was provided by me .  I have reviewed this patient's available data, including medical history, events of note, physical examination, and all test results as part of my evaluation.   CBC: Recent Labs  Lab 11/13/2018 1343 11/13/18 0255 11/14/18 0435 11/15/18 0453  WBC 7.4 7.1 11.1* 11.5*  NEUTROABS 6.4 5.9 9.2* 9.7*  HGB 13.5 12.4 12.0 11.9*  HCT 40.9 39.8 38.8 37.3  MCV 87.4 88.1 89.0 87.6  PLT 339 370 412* 341   Basic Metabolic Panel: Recent Labs  Lab 11/06/2018 1343 11/13/18 0255 11/14/18 0435 11/15/18 0453  NA 139 142 142 140  K 3.2* 4.1 4.3 4.0  CL 106 109 113* 110  CO2 21* 21* 22 22  GLUCOSE 130* 140* 133* 140*  BUN 25* 20 21 16   CREATININE 1.43* 0.96 0.80  0.73  CALCIUM 8.6* 8.3* 8.2* 8.0*  MG  --  2.3 2.4 2.3  PHOS  --  2.1* 2.4* 2.2*   GFR: Estimated Creatinine Clearance: 84.2 mL/min (by C-G formula based on SCr of 0.73 mg/dL). Liver Function Tests: Recent Labs  Lab 11/25/2018 1343 11/13/18 0255 11/14/18 0435 11/15/18 0453  AST 226* 139* 62* 38  ALT 144* 129* 104* 73*  ALKPHOS 70 64 61 63  BILITOT 1.1 0.6 0.7 0.5  PROT 7.9 7.5 7.0 6.6  ALBUMIN 2.7* 2.7* 2.7* 2.6*   No results for input(s): LIPASE, AMYLASE in the last 168 hours. No results for input(s): AMMONIA in the last 168 hours. Coagulation Profile: No results for input(s): INR, PROTIME in the last 168 hours. Cardiac Enzymes: No results for input(s): CKTOTAL, CKMB, CKMBINDEX, TROPONINI in the last 168 hours. BNP (last 3 results) No results for input(s): PROBNP in the last 8760 hours. HbA1C: No results for input(s): HGBA1C in the last 72 hours. CBG: No results for input(s): GLUCAP in the last 168 hours. Lipid Profile: No results for input(s): CHOL, HDL, LDLCALC, TRIG, CHOLHDL, LDLDIRECT in the last 72 hours. Thyroid Function Tests: No results for input(s): TSH,  T4TOTAL, FREET4, T3FREE, THYROIDAB in the last 72 hours. Anemia Panel: Recent Labs    11/14/18 0435 11/15/18 0453  FERRITIN 758* 489*   Urine analysis:    Component Value Date/Time   COLORURINE YELLOW 04/06/2014 1816   APPEARANCEUR CLEAR 04/06/2014 1816   LABSPEC 1.025 04/06/2014 1816   PHURINE 5.5 04/06/2014 1816   GLUCOSEU NEGATIVE 04/06/2014 1816   HGBUR NEGATIVE 04/06/2014 1816   HGBUR trace-intact 11/14/2006 0829   BILIRUBINUR SMALL (A) 04/06/2014 1816   KETONESUR NEGATIVE 04/06/2014 1816   PROTEINUR NEGATIVE 04/06/2014 1816   UROBILINOGEN 1.0 04/06/2014 1816   NITRITE NEGATIVE 04/06/2014 1816   LEUKOCYTESUR SMALL (A) 04/06/2014 1816   Sepsis Labs: @LABRCNTIP (procalcitonin:4,lacticidven:4)  ) Recent Results (from the past 240 hour(s))  SARS Coronavirus 2 [facility] order, Performed in [facility] hospital lab) Nasopharyngeal Nasopharyngeal Swab     Status: Abnormal   Collection Time: 11/25/18  1:43 PM   Specimen: Nasopharyngeal Swab  Result Value Ref Range Status   SARS Coronavirus 2 POSITIVE (A) NEGATIVE Final    Comment: RESULT CALLED TO, READ BACK BY AND VERIFIED WITH: 11/14/18 RN 15:15 11-25-2018 ([email or web address]) (NOTE) If result is NEGATIVE SARS-CoV-2 target nucleic acids are NOT DETECTED. The SARS-CoV-2 RNA is generally detectable in upper and lower  respiratory specimens during the acute phase of infection. The lowest  concentration of SARS-CoV-2 viral copies this assay can detect is 250  copies / mL. A negative result does not preclude SARS-CoV-2 infection  and should not be used as the sole basis for treatment or other  patient management decisions.  A negative result may occur with  improper specimen collection / handling, submission of specimen other  than nasopharyngeal swab, presence of viral mutation(s) within the  areas targeted by this assay, and inadequate number of viral copies  (<250 copies / mL). A negative result must be combined with clinical    observations, patient history, and epidemiological information. If result is POSITIVE SARS-CoV-2 target nucleic acids are DETECTED.  The SARS-CoV-2 RNA is generally detectable in upper and lower  respiratory specimens during the acute phase of infection.  Positive  results are indicative of active infection with SARS-CoV-2.  Clinical  correlation with patient history and other diagnostic information is  necessary to determine patient infection status.  Positive results do  not rule out bacterial infection or co-infection with other viruses. If result is PRESUMPTIVE POSTIVE SARS-CoV-2 nucleic acids MAY BE PRESENT.   A presumptive positive result was obtained on the submitted specimen  and confirmed on repeat testing.  While 2019 novel coronavirus  (SARS-CoV-2) nucleic acids may be present in the submitted sample  additional confirmatory testing may be necessary for epidemiological  and / or clinical management purposes  to differentiate between  SARS-CoV-2 and other Sarbecovirus currently known to infect humans.  If clinically indicated additional testing with an alternate test  methodology [phone]) i s advised. The SARS-CoV-2 RNA is generally  detectable in upper and lower respiratory specimens during the acute  phase of infection. The expected result is Negative. Fact Sheet for Patients:  StrictlyIdeas.no Fact Sheet for Healthcare Providers: BankingDealers.co.za This test is not yet approved or cleared by the [place] FDA and has been authorized for detection and/or diagnosis of SARS-CoV-2 by FDA under an Emergency Use Authorization (EUA).  This EUA will remain in effect (meaning this test can be used) for the duration of the COVID-19 declaration under Section 564(b)(1) of the Act, 21 U.S.C. section 360bbb-3(b)(1), unless the authorization is terminated or revoked sooner. Performed at [facility], [place] [address]  [place], [place] [address]          Radiology Studies: Vas [place] Lower Extremity Venous (dvt)  Result Date: 11/15/2018  Lower Venous Study Indications: Elevated Ddimer.  Risk Factors: COVID 19 positive. Comparison Study: No prior studies. Performing Technologist: Oliver Hum RVT  Examination Guidelines: A complete evaluation includes B-mode imaging, spectral Doppler, color Doppler, and power Doppler as needed of all accessible portions of each vessel. Bilateral testing is considered an integral part of a complete examination. Limited examinations for reoccurring indications may be performed as noted.  +---------+---------------+---------+-----------+----------+--------------+  RIGHT     Compressibility Phasicity Spontaneity Properties Thrombus Aging  +---------+---------------+---------+-----------+----------+--------------+  CFV       Full            Yes       Yes                                    +---------+---------------+---------+-----------+----------+--------------+  SFJ       Full                                                             +---------+---------------+---------+-----------+----------+--------------+  FV Prox   Full                                                             +---------+---------------+---------+-----------+----------+--------------+  FV Mid    Full                                                             +---------+---------------+---------+-----------+----------+--------------+  FV Distal Full                                                             +---------+---------------+---------+-----------+----------+--------------+  PFV       Full                                                             +---------+---------------+---------+-----------+----------+--------------+  POP       Full            Yes       Yes                                    +---------+---------------+---------+-----------+----------+--------------+  PTV       Full                                                              +---------+---------------+---------+-----------+----------+--------------+  PERO      Full                                                             +---------+---------------+---------+-----------+----------+--------------+   +---------+---------------+---------+-----------+----------+--------------+  LEFT      Compressibility Phasicity Spontaneity Properties Thrombus Aging  +---------+---------------+---------+-----------+----------+--------------+  CFV       Full            Yes       Yes                                    +---------+---------------+---------+-----------+----------+--------------+  SFJ       Full                                                             +---------+---------------+---------+-----------+----------+--------------+  FV Prox   Full                                                             +---------+---------------+---------+-----------+----------+--------------+  FV Mid    Full                                                             +---------+---------------+---------+-----------+----------+--------------+  FV Distal Full                                                             +---------+---------------+---------+-----------+----------+--------------+  PFV       Full                                                             +---------+---------------+---------+-----------+----------+--------------+  POP       Full            Yes       Yes                                    +---------+---------------+---------+-----------+----------+--------------+  PTV       Full                                                             +---------+---------------+---------+-----------+----------+--------------+  PERO      Full                                                             +---------+---------------+---------+-----------+----------+--------------+     Summary: Right: There is no evidence of deep vein thrombosis in the lower extremity.  No cystic structure found in the popliteal fossa. Left: There is no evidence of deep vein thrombosis in the lower extremity. No cystic structure found in the popliteal fossa.  *See table(s) above for measurements and observations.    Preliminary         Scheduled Meds:  amLODipine  10 mg Oral Daily   Chlorhexidine Gluconate Cloth  6 each Topical Daily   enoxaparin (LOVENOX) injection  100 mg Subcutaneous Q12H   hydrALAZINE  50 mg Oral Q6H   hydrochlorothiazide  25 mg Oral Daily   methylPREDNISolone (SOLU-MEDROL) injection  60 mg Intravenous Q12H   metoprolol tartrate  12.5 mg Oral BID   sodium chloride flush  3 mL Intravenous Q12H   Continuous Infusions:  sodium chloride Stopped (11/13/18 1159)   cefTRIAXone (ROCEPHIN)  IV 200 mL/hr at 11/14/18 2100   remdesivir 100 mg in NS 250 mL Stopped (11/14/18 1748)     LOS: 3 days   The patient is critically ill with multiple organ systems failure and requires high complexity decision making for assessment and support, frequent evaluation and titration of therapies, application of advanced monitoring technologies and extensive interpretation of multiple databases. Critical Care Time devoted to patient care services described in this note  Time spent: 40 minutes     Shalon Councilman, Roselind Messier, MD [facility] Pager [phone]  If 7PM-7AM, please contact night-coverage [email or web address] Password [email or web address] 11/15/2018, 3:23 PM

## 2018-11-15 DEATH — deceased

## 2018-11-16 ENCOUNTER — Inpatient Hospital Stay (HOSPITAL_COMMUNITY): Payer: BC Managed Care – PPO

## 2018-11-16 DIAGNOSIS — J189 Pneumonia, unspecified organism: Secondary | ICD-10-CM | POA: Diagnosis present

## 2018-11-16 DIAGNOSIS — U071 COVID-19: Secondary | ICD-10-CM | POA: Diagnosis not present

## 2018-11-16 LAB — CBC WITH DIFFERENTIAL/PLATELET
Abs Immature Granulocytes: 0.21 10*3/uL — ABNORMAL HIGH (ref 0.00–0.07)
Basophils Absolute: 0 10*3/uL (ref 0.0–0.1)
Basophils Relative: 0 %
Eosinophils Absolute: 0 10*3/uL (ref 0.0–0.5)
Eosinophils Relative: 0 %
HCT: 40.9 % (ref 36.0–46.0)
Hemoglobin: 12.9 g/dL (ref 12.0–15.0)
Immature Granulocytes: 2 %
Lymphocytes Relative: 9 %
Lymphs Abs: 1.2 10*3/uL (ref 0.7–4.0)
MCH: 27.4 pg (ref 26.0–34.0)
MCHC: 31.5 g/dL (ref 30.0–36.0)
MCV: 86.8 fL (ref 80.0–100.0)
Monocytes Absolute: 0.8 10*3/uL (ref 0.1–1.0)
Monocytes Relative: 6 %
Neutro Abs: 10.7 10*3/uL — ABNORMAL HIGH (ref 1.7–7.7)
Neutrophils Relative %: 83 %
Platelets: 410 10*3/uL — ABNORMAL HIGH (ref 150–400)
RBC: 4.71 MIL/uL (ref 3.87–5.11)
RDW: 14 % (ref 11.5–15.5)
WBC: 12.9 10*3/uL — ABNORMAL HIGH (ref 4.0–10.5)
nRBC: 0 % (ref 0.0–0.2)

## 2018-11-16 LAB — COMPREHENSIVE METABOLIC PANEL
ALT: 70 U/L — ABNORMAL HIGH (ref 0–44)
AST: 41 U/L (ref 15–41)
Albumin: 2.8 g/dL — ABNORMAL LOW (ref 3.5–5.0)
Alkaline Phosphatase: 78 U/L (ref 38–126)
Anion gap: 11 (ref 5–15)
BUN: 18 mg/dL (ref 8–23)
CO2: 23 mmol/L (ref 22–32)
Calcium: 8.2 mg/dL — ABNORMAL LOW (ref 8.9–10.3)
Chloride: 104 mmol/L (ref 98–111)
Creatinine, Ser: 0.91 mg/dL (ref 0.44–1.00)
GFR calc Af Amer: >60 mL/min (ref >60)
GFR calc non Af Amer: >60 mL/min (ref >60)
Glucose, Bld: 122 mg/dL — ABNORMAL HIGH (ref 70–99)
Potassium: 3.8 mmol/L (ref 3.5–5.1)
Sodium: 138 mmol/L (ref 135–145)
Total Bilirubin: 0.8 mg/dL (ref 0.3–1.2)
Total Protein: 6.8 g/dL (ref 6.5–8.1)

## 2018-11-16 LAB — PROCALCITONIN: Procalcitonin: <0.1 ng/mL

## 2018-11-16 LAB — FERRITIN: Ferritin: 518 ng/mL — ABNORMAL HIGH (ref 11–307)

## 2018-11-16 LAB — C-REACTIVE PROTEIN: CRP: 2.8 mg/dL — ABNORMAL HIGH (ref <1.0)

## 2018-11-16 LAB — MAGNESIUM: Magnesium: 2.2 mg/dL (ref 1.7–2.4)

## 2018-11-16 LAB — PHOSPHORUS: Phosphorus: 2.2 mg/dL — ABNORMAL LOW (ref 2.5–4.6)

## 2018-11-16 LAB — D-DIMER, QUANTITATIVE: D-Dimer, Quant: 9.88 ug/mL-FEU — ABNORMAL HIGH (ref 0.00–0.50)

## 2018-11-16 MED ORDER — LISINOPRIL 2.5 MG PO TABS
2.5000 mg | ORAL_TABLET | Freq: Every day | ORAL | Status: DC
  Administered 2018-11-16 – 2018-11-17 (×2): 2.5 mg via ORAL
  Filled 2018-11-16 (×3): qty 1

## 2018-11-16 MED ORDER — METOPROLOL TARTRATE 25 MG PO TABS
50.0000 mg | ORAL_TABLET | Freq: Two times a day (BID) | ORAL | Status: DC
  Administered 2018-11-16 – 2018-12-01 (×29): 50 mg via ORAL
  Filled 2018-11-16 (×31): qty 1

## 2018-11-16 MED ORDER — FUROSEMIDE 10 MG/ML IJ SOLN
60.0000 mg | Freq: Once | INTRAMUSCULAR | Status: AC
  Administered 2018-11-16: 15:00:00 60 mg via INTRAVENOUS
  Filled 2018-11-16: qty 6

## 2018-11-16 NOTE — Progress Notes (Addendum)
PROGRESS NOTE    Shelley Allison  ZOX:[id number] DOB: 12-24-1955 DOA: 2018-11-26 PCP: Leeroy Bock, DO   Brief Narrative:  Shelley Allison is a 63 y.o. BF PMHx  HTN and cerebral artery aneurysm rupture   Presented to the ED 9/28 with about 1 week of N/V/D followed by progressive shortness of breath with associated fever starting 9/27. Symptoms started around the time of a large wedding she attended. She was found to be hypoxic requiring 6L O2 with positive SARS-CoV-2 testing, elevated inflammatory markers (CRP 20), and reticular CXR infiltrates, possibly left base infiltrate. [facility], started on steroids, and remdesivir 9/28, given tocilizumab on 9/29 when hypoxia worsened. D-dimer has risen from 2.93 to 6.72, so CTA chest is ordered. Due to progressive hypoxia and tachypnea, the patient will be transferred to the ICU 9/30.   Subjective: 10/2  A/O x4, follows all commands, negative CP, negative abdominal pain, negative N/V.  Positive S OB   Assessment & Plan:   Principal Problem:   Respiratory tract infection due to COVID-19 virus Active Problems:   OBESITY, NOS   HYPERTENSION, BENIGN SYSTEMIC   CEREBRAL ANEURYSM, RUPTURED   Pneumonia due to COVID-19 virus   Acute respiratory failure with hypoxia (HCC)   Acute pulmonary embolus (HCC)   Essential hypertension   AKI (acute kidney injury) (HCC)   Morbid obesity with BMI of 70 and over, adult (HCC)   CAP (community acquired pneumonia)   COVID 19 pneumonia/acute respiratory failure with hypoxia Recent Labs  Lab 2018/11/26 1606 11/13/18 0255 11/14/18 0435 11/15/18 0453 11/16/18 0425  CRP 20.0* 17.0* 8.7* 4.5* 2.8*   Recent Labs  Lab 26-Nov-2018 1606 11/13/18 0255 11/14/18 0435 11/15/18 0453 11/16/18 0425  DDIMER 2.93* 4.49* 6.72* 17.18* 9.88*  - Solu-Medrol 60 mg BID - Remdesivir per pharmacy protocol - 9/29 Actemra x1 - CCP was discussed with patient who declined this treatment at this point.  -HHFNC, titrate to maintain SPO2 88 to 93% - Prone patient PRN to maintain SPO2 88 to 93% -Strict in and out +2.0 L -Daily weight Filed Weights   2018/11/26 1341 11/16/18 0259  Weight: 99.8 kg 98.9 kg  - 10/2 PCXR pending  Acute pulmonary embolus/DVT - CTA chest pending (machine broken currently) - Continue treatment dose Lovenox - Bilateral lower extremity Doppler; negative for DVT see results below  CAP -Complete 7-day course antibiotics   Essential HTN - Amlodipine 10 mg daily - Hydralazine IV PRN - 10/1 increase Hydralazine 50 mg QID - 10/2 increase Metoprolol 50 mg  BID -10/2 lisinopril 2.5 mg daily -10/2 Lasix IV 60 mg x 1.  PCXR today shows increased opacification; some pulmonary edema, would dose Lasix PRN - BP goal<120/80 secondary to Hx ruptured aneurysm.  Counseled patient at length concerning need for her to have extremely good control of her BP upon discharge home.  Acute kidney injury Recent Labs  Lab 26-Nov-2018 1343 11/13/18 0255 11/14/18 0435 11/15/18 0453 11/16/18 0425  CREATININE 1.43* 0.96 0.80 0.73 0.91  -Resolved  Morbid obesity -BMI 36  -Risk factor for poor outcome to COVID 19 pneumonia   Hx Ruptured Cerebral Artery aneurysm -Obtain good blood pressure control: Goal BP< 120/80 -See essential HTN    DVT prophylaxis: Lovenox treatment dose Code Status: Full Family Communication: 10/2 spoke with [place] (mother) discussed plan of care and answered all questions Disposition Plan:    Consultants:  PCCM    Procedures/Significant Events:  9/28 PCXR; Underlying fine reticular interstitial disease of  uncertain etiology. There is atelectatic change in the left base. Early pneumonia in the left base cannot be excluded. -Heart upper normal in size.   10/1 bilateral lower extremity Doppler;-right: Negative DVT/cystic structure found in the popliteal fossa. -Left: Negative DVT/cystic structure found in the popliteal fossa 10/2 PCXR;Diffuse  interstitial and alveolar opacities, most confluent in the lung bases, increased since prior study.    I have personally reviewed and interpreted all radiology studies and my findings are as above.  VENTILATOR SETTINGS: HFNC Flow rate 15 L/min     Cultures 9/28 SARS coronavirus positive      Antimicrobials: Anti-infectives (From admission, onward)   Start     Stop   11/13/18 1600  remdesivir 100 mg in sodium chloride 0.9 % 250 mL IVPB     11/17/18 1559   11/13/18 1200  azithromycin (ZITHROMAX) 500 mg in sodium chloride 0.9 % 250 mL IVPB     11/13/18 1340   11/13/18 1200  cefTRIAXone (ROCEPHIN) 1 g in sodium chloride 0.9 % 100 mL IVPB         11/02/2018 2200  remdesivir 200 mg in sodium chloride 0.9 % 250 mL IVPB     11/05/2018 2213   10/30/2018 1415  cefTRIAXone (ROCEPHIN) 1 g in sodium chloride 0.9 % 100 mL IVPB     11/07/2018 1517   11/08/2018 1415  azithromycin (ZITHROMAX) 500 mg in sodium chloride 0.9 % 250 mL IVPB     11/11/2018 1736       Devices   LINES / TUBES:      Continuous Infusions: . sodium chloride Stopped (11/13/18 1159)  . remdesivir 100 mg in NS 250 mL Stopped (11/15/18 1859)     Objective: Vitals:   11/16/18 1100 11/16/18 1200 11/16/18 1221 11/16/18 1300  BP: (!) 146/83 (!) 141/83 (!) 146/83 (!) 149/85  Pulse: 80 83 80 86  Resp: (!) 41 (!) 42 (!) 40 (!) 42  Temp:      TempSrc:      SpO2: 97% 97% 94% 96%  Weight:      Height:        Intake/Output Summary (Last 24 hours) at 11/16/2018 1350 Last data filed at 11/16/2018 1000 Gross per 24 hour  Intake 610 ml  Output 3476 ml  Net -2866 ml   Filed Weights   11/11/2018 1341 11/16/18 0259  Weight: 99.8 kg 98.9 kg   Physical Exam:  General: A/O x4, positive acute respiratory distress (slightly worse than 10/1) Eyes: negative scleral hemorrhage, negative anisocoria, negative icterus ENT: Negative Runny nose, negative gingival bleeding, Neck:  Negative scars, masses, torticollis,  lymphadenopathy, JVD Lungs: Difficult to auscultate secondary to body habitus, but appears diffuse decreased breath sounds.  Able to detect air movement RIGHT>> LEFT, without wheezes or crackles Cardiovascular: Regular rate and rhythm without murmur gallop or rub normal S1 and S2 Abdomen: MORBIDLY OBESE negative abdominal pain, nondistended, positive soft, bowel sounds, no rebound, no ascites, no appreciable mass Extremities: No significant cyanosis, clubbing.  Difficult to ascertain if any edema secondary to morbid obesity. Skin: Negative rashes, lesions, ulcers Psychiatric:  Negative depression, negative anxiety, negative fatigue, negative mania  Central nervous system:  Cranial nerves II through XII intact, tongue/uvula midline, all extremities muscle strength 5/5, sensation intact throughout,  negative dysarthria, negative expressive aphasia, negative receptive aphasia..     Data Reviewed: Care during the described time interval was provided by me .  I have reviewed this patient's available data, including medical history, events  of note, physical examination, and all test results as part of my evaluation.   CBC: Recent Labs  Lab 02/19/18 1343 11/13/18 0255 11/14/18 0435 11/15/18 0453 11/16/18 0425  WBC 7.4 7.1 11.1* 11.5* 12.9*  NEUTROABS 6.4 5.9 9.2* 9.7* 10.7*  HGB 13.5 12.4 12.0 11.9* 12.9  HCT 40.9 39.8 38.8 37.3 40.9  MCV 87.4 88.1 89.0 87.6 86.8  PLT 339 370 412* 341 410*   Basic Metabolic Panel: Recent Labs  Lab 02/19/18 1343 11/13/18 0255 11/14/18 0435 11/15/18 0453 11/16/18 0425  NA 139 142 142 140 138  K 3.2* 4.1 4.3 4.0 3.8  CL 106 109 113* 110 104  CO2 21* 21* 22 22 23   GLUCOSE 130* 140* 133* 140* 122*  BUN 25* 20 21 16 18   CREATININE 1.43* 0.96 0.80 0.73 0.91  CALCIUM 8.6* 8.3* 8.2* 8.0* 8.2*  MG  --  2.3 2.4 2.3 2.2  PHOS  --  2.1* 2.4* 2.2* 2.2*   GFR: Estimated Creatinine Clearance: 73.7 mL/min (by C-G formula based on SCr of 0.91 mg/dL). Liver  Function Tests: Recent Labs  Lab 02/19/18 1343 11/13/18 0255 11/14/18 0435 11/15/18 0453 11/16/18 0425  AST 226* 139* 62* 38 41  ALT 144* 129* 104* 73* 70*  ALKPHOS 70 64 61 63 78  BILITOT 1.1 0.6 0.7 0.5 0.8  PROT 7.9 7.5 7.0 6.6 6.8  ALBUMIN 2.7* 2.7* 2.7* 2.6* 2.8*   No results for input(s): LIPASE, AMYLASE in the last 168 hours. No results for input(s): AMMONIA in the last 168 hours. Coagulation Profile: No results for input(s): INR, PROTIME in the last 168 hours. Cardiac Enzymes: No results for input(s): CKTOTAL, CKMB, CKMBINDEX, TROPONINI in the last 168 hours. BNP (last 3 results) No results for input(s): PROBNP in the last 8760 hours. HbA1C: No results for input(s): HGBA1C in the last 72 hours. CBG: No results for input(s): GLUCAP in the last 168 hours. Lipid Profile: No results for input(s): CHOL, HDL, LDLCALC, TRIG, CHOLHDL, LDLDIRECT in the last 72 hours. Thyroid Function Tests: No results for input(s): TSH, T4TOTAL, FREET4, T3FREE, THYROIDAB in the last 72 hours. Anemia Panel: Recent Labs    11/15/18 0453 11/16/18 0425  FERRITIN 489* 518*   Urine analysis:    Component Value Date/Time   COLORURINE YELLOW 11/15/2018 2215   APPEARANCEUR HAZY (A) 11/15/2018 2215   LABSPEC 1.018 11/15/2018 2215   PHURINE 6.0 11/15/2018 2215   GLUCOSEU NEGATIVE 11/15/2018 2215   HGBUR NEGATIVE 11/15/2018 2215   HGBUR trace-intact 11/14/2006 0829   BILIRUBINUR NEGATIVE 11/15/2018 2215   KETONESUR NEGATIVE 11/15/2018 2215   PROTEINUR NEGATIVE 11/15/2018 2215   UROBILINOGEN 1.0 04/06/2014 1816   NITRITE NEGATIVE 11/15/2018 2215   LEUKOCYTESUR NEGATIVE 11/15/2018 2215   Sepsis Labs: @LABRCNTIP (procalcitonin:4,lacticidven:4)  ) Recent Results (from the past 240 hour(s))  SARS Coronavirus 2 [facility](Hospital order, [facility]) Nasopharyngeal Nasopharyngeal Swab     Status: Abnormal   Collection Time: 02/19/18  1:43 PM   Specimen: Nasopharyngeal Swab   Result Value Ref Range Status   SARS Coronavirus 2 POSITIVE (A) NEGATIVE Final    Comment: RESULT CALLED TO, READ BACK BY AND VERIFIED WITH: Higinio RogerM. Barber RN 15:15 2019/01/30 ([email or web address]) (NOTE) If result is NEGATIVE SARS-CoV-2 target nucleic acids are NOT DETECTED. The SARS-CoV-2 RNA is generally detectable in upper and lower  respiratory specimens during the acute phase of infection. The lowest  concentration of SARS-CoV-2 viral copies this assay can detect is 250  copies / mL. A  negative result does not preclude SARS-CoV-2 infection  and should not be used as the sole basis for treatment or other  patient management decisions.  A negative result may occur with  improper specimen collection / handling, submission of specimen other  than nasopharyngeal swab, presence of viral mutation(s) within the  areas targeted by this assay, and inadequate number of viral copies  (<250 copies / mL). A negative result must be combined with clinical  observations, patient history, and epidemiological information. If result is POSITIVE SARS-CoV-2 target nucleic acids are DETECTED.  The SARS-CoV-2 RNA is generally detectable in upper and lower  respiratory specimens during the acute phase of infection.  Positive  results are indicative of active infection with SARS-CoV-2.  Clinical  correlation with patient history and other diagnostic information is  necessary to determine patient infection status.  Positive results do  not rule out bacterial infection or co-infection with other viruses. If result is PRESUMPTIVE POSTIVE SARS-CoV-2 nucleic acids MAY BE PRESENT.   A presumptive positive result was obtained on the submitted specimen  and confirmed on repeat testing.  While 2019 novel coronavirus  (SARS-CoV-2) nucleic acids may be present in the submitted sample  additional confirmatory testing may be necessary for epidemiological  and / or clinical management purposes  to differentiate between  SARS-CoV-2 and  other Sarbecovirus currently known to infect humans.  If clinically indicated additional testing with an alternate test  methodology [phone]) i s advised. The SARS-CoV-2 RNA is generally  detectable in upper and lower respiratory specimens during the acute  phase of infection. The expected result is Negative. Fact Sheet for Patients:  BoilerBrush.com.cy Fact Sheet for Healthcare Providers: https://pope.com/ This test is not yet approved or cleared by the [place] FDA and has been authorized for detection and/or diagnosis of SARS-CoV-2 by FDA under an Emergency Use Authorization (EUA).  This EUA will remain in effect (meaning this test can be used) for the duration of the COVID-19 declaration under Section 564(b)(1) of the Act, 21 U.S.C. section 360bbb-3(b)(1), unless the authorization is terminated or revoked sooner. Performed at [facility], [address], [place], [place] [address]          Radiology Studies: Dg Chest Port 1 View  Result Date: 11/16/2018 CLINICAL DATA:  COVID EXAM: PORTABLE CHEST 1 VIEW COMPARISON:  11-18-18 FINDINGS: Diffuse interstitial and airspace opacities noted. More focal confluent opacities in the lung bases. Overall airspace disease slightly increased since prior study. No effusions. No acute bony abnormality. IMPRESSION: Diffuse interstitial and alveolar opacities, most confluent in the lung bases, increased since prior study. Electronically Signed   By: Charlett Nose M.D.   On: 11/16/2018 08:36   Vas [place] Lower Extremity Venous (dvt)  Result Date: 11/15/2018  Lower Venous Study Indications: Elevated Ddimer.  Risk Factors: COVID 19 positive. Comparison Study: No prior studies. Performing Technologist: Chanda Busing RVT  Examination Guidelines: A complete evaluation includes B-mode imaging, spectral Doppler, color Doppler, and power Doppler as needed of all accessible portions of each vessel.  Bilateral testing is considered an integral part of a complete examination. Limited examinations for reoccurring indications may be performed as noted.  +---------+---------------+---------+-----------+----------+--------------+ RIGHT    CompressibilityPhasicitySpontaneityPropertiesThrombus Aging +---------+---------------+---------+-----------+----------+--------------+ CFV      Full           Yes      Yes                                 +---------+---------------+---------+-----------+----------+--------------+  SFJ      Full                                                        +---------+---------------+---------+-----------+----------+--------------+ FV Prox  Full                                                        +---------+---------------+---------+-----------+----------+--------------+ FV Mid   Full                                                        +---------+---------------+---------+-----------+----------+--------------+ FV DistalFull                                                        +---------+---------------+---------+-----------+----------+--------------+ PFV      Full                                                        +---------+---------------+---------+-----------+----------+--------------+ POP      Full           Yes      Yes                                 +---------+---------------+---------+-----------+----------+--------------+ PTV      Full                                                        +---------+---------------+---------+-----------+----------+--------------+ PERO     Full                                                        +---------+---------------+---------+-----------+----------+--------------+   +---------+---------------+---------+-----------+----------+--------------+ LEFT     CompressibilityPhasicitySpontaneityPropertiesThrombus Aging  +---------+---------------+---------+-----------+----------+--------------+ CFV      Full           Yes      Yes                                 +---------+---------------+---------+-----------+----------+--------------+ SFJ      Full                                                        +---------+---------------+---------+-----------+----------+--------------+  FV Prox  Full                                                        +---------+---------------+---------+-----------+----------+--------------+ FV Mid   Full                                                        +---------+---------------+---------+-----------+----------+--------------+ FV DistalFull                                                        +---------+---------------+---------+-----------+----------+--------------+ PFV      Full                                                        +---------+---------------+---------+-----------+----------+--------------+ POP      Full           Yes      Yes                                 +---------+---------------+---------+-----------+----------+--------------+ PTV      Full                                                        +---------+---------------+---------+-----------+----------+--------------+ PERO     Full                                                        +---------+---------------+---------+-----------+----------+--------------+     Summary: Right: There is no evidence of deep vein thrombosis in the lower extremity. No cystic structure found in the popliteal fossa. Left: There is no evidence of deep vein thrombosis in the lower extremity. No cystic structure found in the popliteal fossa.  *See table(s) above for measurements and observations. Electronically signed by Servando Snare MD on 11/15/2018 at 5:45:26 PM.    Final         Scheduled Meds: . amLODipine  10 mg Oral Daily  . Chlorhexidine Gluconate Cloth  6 each  Topical Daily  . enoxaparin (LOVENOX) injection  100 mg Subcutaneous Q12H  . furosemide  60 mg Intravenous Once  . hydrALAZINE  50 mg Oral Q6H  . lisinopril  2.5 mg Oral Daily  . methylPREDNISolone (SOLU-MEDROL) injection  60 mg Intravenous Q12H  . metoprolol tartrate  50 mg Oral BID  . sodium chloride flush  3 mL Intravenous Q12H   Continuous Infusions: . sodium chloride Stopped (11/13/18 1159)  . remdesivir 100 mg  in NS 250 mL Stopped (11/15/18 1859)     LOS: 4 days   The patient is critically ill with multiple organ systems failure and requires high complexity decision making for assessment and support, frequent evaluation and titration of therapies, application of advanced monitoring technologies and extensive interpretation of multiple databases. Critical Care Time devoted to patient care services described in this note  Time spent: 40 minutes     Azreal Stthomas, Roselind Messier, MD [facility] Pager [phone]  If 7PM-7AM, please contact night-coverage [email or web address] Password [email or web address] 11/16/2018, 1:50 PM

## 2018-11-16 NOTE — Progress Notes (Signed)
BP and RR have been high

## 2018-11-16 NOTE — Progress Notes (Signed)
NAME:  Shelley Allison, MRN:  [id number], DOB:  1955-06-26, LOS: 4 ADMISSION DATE:  11/04/2018, CONSULTATION DATE:  11/15/18 REFERRING MD:  Dr Jarvis Newcomer, CHIEF COMPLAINT:  covid-19 pna/acute hypoxemic resp failure  Brief History   63 yo aa female with pmh cerebral aneurysm rupture presented with covid-19 pna after attending wedding and with high risk contacts.    Past Medical History   Past Medical History:  Diagnosis Date  . Cerebral aneurysm rupture (HCC) 1999  . Hypertension     Significant Hospital Events   9/30: transferrred to ICU for progressive hypoxic resp failure, started of tx dose a/c for suspected PE  Consults:  CCM 10/1  Procedures:   Significant Diagnostic Tests:  10/1 le venous doppler: negative  Micro Data:  9/28 sars2: POSITIVE  Antimicrobials:  9/28 azithro -> 9/29 9/28 ceftriaxone-> 10/2 9/28 remdesivir-> 9/29 tocilizumab  Interim history/subjective:  10/2: afebrile. On 14-15L HF and does well with sats in mid to high 90's unless moving. Takes a bit to recover. Pct decreased and wbc relatively stable (on steroids). D/c ceftriaxone today.  10/1: afebrile overnight. Remains on NRB. Labs pending resulting this am.   Objective   Blood pressure (!) 149/85, pulse 86, temperature 99.6 F (37.6 C), temperature source Oral, resp. rate (!) 42, height 5\' 5"  (1.651 m), weight 98.9 kg, SpO2 96 %.        Intake/Output Summary (Last 24 hours) at 11/16/2018 1330 Last data filed at 11/16/2018 1000 Gross per 24 hour  Intake 610 ml  Output 3476 ml  Net -2866 ml   Filed Weights   11/13/2018 1341 11/16/18 0259  Weight: 99.8 kg 98.9 kg    Examination: General: no acute distress, sitting up in bed a bit tachypneic (just moved from chair to bed) HEENT: NCAT, EOMI, PERRLA, MMMP Lungs:  no wheezes, rhonchi, rales, diminished in bilateral bases Cardiovascular: RRR, no m/g/r Abdomen: soft, NT,ND, BS+ Extremities: + trace edema Skin: no rashes, warm and dry Neuro:  moves all 4 extremities   Resolved Hospital Problem list   none  Assessment & Plan:  covid-19 pneumonia Acute hypoxemia resp failure Concern for acute PE -titrate oxygen -cont remdesivir for 5 days -s/p toci 9/29 -d/c ceftriaxone in light of negative PCT.  Do not suspect superimposed baterial infection. Wbc likely 2/2 steroids ongoing. afebrile -daughter positive for covid as well. At home recovering.  -on solumedrol ~1mg /kg/day -pulm hygiene and recommend self proning as able -follow markers (downtrending crp/ddimer, mildly increased ferritin) -ddimer elevated but downtrending today  -LE dopplers negative -cxr with bibasilar infiltrates stable, personally reviewed.  -pt does want intubation if needed and remains full code   H/o htn H/o cerebral aneurysm rupture ARF: resolved Transaminitis: improving Best practice:  Diet: reg Pain/Anxiety/Delirium protocol (if indicated): n/a VAP protocol (if indicated): n/a DVT prophylaxis: lovenox GI prophylaxis: n/a Glucose control: n/a Mobility: with assistance Code Status: FULL Family Communication: pt only Disposition: stable for transfer to floor at [facility]  Labs   CBC: Recent Labs  Lab 10/16/2018 1343 11/13/18 0255 11/14/18 0435 11/15/18 0453 11/16/18 0425  WBC 7.4 7.1 11.1* 11.5* 12.9*  NEUTROABS 6.4 5.9 9.2* 9.7* 10.7*  HGB 13.5 12.4 12.0 11.9* 12.9  HCT 40.9 39.8 38.8 37.3 40.9  MCV 87.4 88.1 89.0 87.6 86.8  PLT 339 370 412* 341 410*    Basic Metabolic Panel: Recent Labs  Lab 11/01/2018 1343 11/13/18 0255 11/14/18 0435 11/15/18 0453 11/16/18 0425  NA 139 142 142 140 138  K 3.2*  4.1 4.3 4.0 3.8  CL 106 109 113* 110 104  CO2 21* 21* 22 22 23   GLUCOSE 130* 140* 133* 140* 122*  BUN 25* 20 21 16 18   CREATININE 1.43* 0.96 0.80 0.73 0.91  CALCIUM 8.6* 8.3* 8.2* 8.0* 8.2*  MG  --  2.3 2.4 2.3 2.2  PHOS  --  2.1* 2.4* 2.2* 2.2*   GFR: Estimated Creatinine Clearance: 73.7 mL/min (by C-G formula based on SCr of 0.91  mg/dL). Recent Labs  Lab 11-25-2018 1606 11/13/18 0255 11/14/18 0435 11/15/18 0453 11/16/18 0425  PROCALCITON 0.17  --   --   --  <0.10  WBC  --  7.1 11.1* 11.5* 12.9*    Liver Function Tests: Recent Labs  Lab 11-25-2018 1343 11/13/18 0255 11/14/18 0435 11/15/18 0453 11/16/18 0425  AST 226* 139* 62* 38 41  ALT 144* 129* 104* 73* 70*  ALKPHOS 70 64 61 63 78  BILITOT 1.1 0.6 0.7 0.5 0.8  PROT 7.9 7.5 7.0 6.6 6.8  ALBUMIN 2.7* 2.7* 2.7* 2.6* 2.8*   No results for input(s): LIPASE, AMYLASE in the last 168 hours. No results for input(s): AMMONIA in the last 168 hours.  ABG No results found for: PHART, PCO2ART, PO2ART, HCO3, TCO2, ACIDBASEDEF, O2SAT   Coagulation Profile: No results for input(s): INR, PROTIME in the last 168 hours.  Cardiac Enzymes: No results for input(s): CKTOTAL, CKMB, CKMBINDEX, TROPONINI in the last 168 hours.  HbA1C: Hemoglobin A1C  Date/Time Value Ref Range Status  05/15/2015 03:52 PM 5.5  Final  04/25/2014 04:43 PM 5.8  Final    CBG: No results for input(s): GLUCAP in the last 168 hours.   Critical care time: The patient is critically ill with multiple organ systems failure and requires high complexity decision making for assessment and support, frequent evaluation and titration of therapies, application of advanced monitoring technologies and extensive interpretation of multiple databases.  Care time 36 mins. This represents my time independent of the NP's/PA's/med students/residents time taking care of the pt. This is excluding procedures.     Audria Nine DO Pager: [phone] After hours pager: [phone]  [facility] 11/16/2018, 1:30 PM

## 2018-11-17 ENCOUNTER — Ambulatory Visit (HOSPITAL_COMMUNITY)
Admit: 2018-11-17 | Discharge: 2018-11-17 | Disposition: A | Discharge status: Home / Self Care | Payer: BC Managed Care – PPO | Attending: Family Medicine | Admitting: Family Medicine

## 2018-11-17 LAB — CBC WITH DIFFERENTIAL/PLATELET
Abs Immature Granulocytes: 0.32 10*3/uL — ABNORMAL HIGH (ref 0.00–0.07)
Basophils Absolute: 0 10*3/uL (ref 0.0–0.1)
Basophils Relative: 0 %
Eosinophils Absolute: 0 10*3/uL (ref 0.0–0.5)
Eosinophils Relative: 0 %
HCT: 43 % (ref 36.0–46.0)
Hemoglobin: 14 g/dL (ref 12.0–15.0)
Immature Granulocytes: 3 %
Lymphocytes Relative: 8 %
Lymphs Abs: 1 10*3/uL (ref 0.7–4.0)
MCH: 28.1 pg (ref 26.0–34.0)
MCHC: 32.6 g/dL (ref 30.0–36.0)
MCV: 86.3 fL (ref 80.0–100.0)
Monocytes Absolute: 0.5 10*3/uL (ref 0.1–1.0)
Monocytes Relative: 4 %
Neutro Abs: 10.6 10*3/uL — ABNORMAL HIGH (ref 1.7–7.7)
Neutrophils Relative %: 85 %
Platelets: 390 10*3/uL (ref 150–400)
RBC: 4.98 MIL/uL (ref 3.87–5.11)
RDW: 13.7 % (ref 11.5–15.5)
WBC: 12.4 10*3/uL — ABNORMAL HIGH (ref 4.0–10.5)
nRBC: 0 % (ref 0.0–0.2)

## 2018-11-17 LAB — PROCALCITONIN: Procalcitonin: 0.67 ng/mL

## 2018-11-17 LAB — COMPREHENSIVE METABOLIC PANEL
ALT: 68 U/L — ABNORMAL HIGH (ref 0–44)
AST: 50 U/L — ABNORMAL HIGH (ref 15–41)
Albumin: 2.9 g/dL — ABNORMAL LOW (ref 3.5–5.0)
Alkaline Phosphatase: 82 U/L (ref 38–126)
Anion gap: 9 (ref 5–15)
BUN: 23 mg/dL (ref 8–23)
CO2: 26 mmol/L (ref 22–32)
Calcium: 8.5 mg/dL — ABNORMAL LOW (ref 8.9–10.3)
Chloride: 104 mmol/L (ref 98–111)
Creatinine, Ser: 0.87 mg/dL (ref 0.44–1.00)
GFR calc Af Amer: >60 mL/min (ref >60)
GFR calc non Af Amer: >60 mL/min (ref >60)
Glucose, Bld: 138 mg/dL — ABNORMAL HIGH (ref 70–99)
Potassium: 4.2 mmol/L (ref 3.5–5.1)
Sodium: 139 mmol/L (ref 135–145)
Total Bilirubin: 1 mg/dL (ref 0.3–1.2)
Total Protein: 6.7 g/dL (ref 6.5–8.1)

## 2018-11-17 LAB — D-DIMER, QUANTITATIVE: D-Dimer, Quant: 8.93 ug/mL-FEU — ABNORMAL HIGH (ref 0.00–0.50)

## 2018-11-17 LAB — C-REACTIVE PROTEIN: CRP: 1.6 mg/dL — ABNORMAL HIGH (ref <1.0)

## 2018-11-17 LAB — PHOSPHORUS: Phosphorus: 2.8 mg/dL (ref 2.5–4.6)

## 2018-11-17 LAB — MAGNESIUM: Magnesium: 2.2 mg/dL (ref 1.7–2.4)

## 2018-11-17 MED ORDER — LISINOPRIL 10 MG PO TABS
10.0000 mg | ORAL_TABLET | Freq: Every day | ORAL | Status: DC
  Administered 2018-11-17: 12:00:00 10 mg via ORAL
  Filled 2018-11-17: qty 1

## 2018-11-17 MED ORDER — IOHEXOL 350 MG/ML SOLN
100.0000 mL | Freq: Once | INTRAVENOUS | Status: AC | PRN
  Administered 2018-11-17: 12:00:00 65 mL via INTRAVENOUS

## 2018-11-17 MED ORDER — ENOXAPARIN SODIUM 40 MG/0.4ML ~~LOC~~ SOLN: 40.0000 mg | SUBCUTANEOUS | Status: DC

## 2018-11-17 NOTE — Progress Notes (Signed)
PROGRESS NOTE  JACOB CHAMBLEE  IRS:[id number] DOB: [birth year] DOA: 11/05/2018 PCP: Leeroy Bock, DO   Brief Narrative: [facility] is a 63 y.o. female with a history of HTN and cerebral artery aneurysm rupture who presented to the ED 9/28 with about 1 week of N/V/D followed by progressive shortness of breath with associated fever starting 9/27. Symptoms started around the time of a large wedding she attended. She was found to be hypoxic requiring 6L O2 with positive SARS-CoV-2 testing, elevated inflammatory markers (CRP 20), and reticular CXR infiltrates, possibly left base infiltrate. She was admitted to [facility], started on steroids, and remdesivir 9/28, given tocilizumab on 9/29 when hypoxia worsened. D-dimer has risen from 2.93 to 6.72, so CTA chest is ordered. Due to progressive hypoxia and tachypnea, the patient will be transferred to the ICU 9/30.  Assessment & Plan: Principal Problem:   Respiratory tract infection due to COVID-19 virus Active Problems:   OBESITY, NOS   HYPERTENSION, BENIGN SYSTEMIC   CEREBRAL ANEURYSM, RUPTURED   Pneumonia due to COVID-19 virus   Acute respiratory failure with hypoxia (HCC)   Acute pulmonary embolus (HCC)   Essential hypertension   AKI (acute kidney injury) (HCC)   Morbid obesity with BMI of 70 and over, adult (HCC)   CAP (community acquired pneumonia)  Acute hypoxic respiratory failure due to covid-19 pneumonia: Remains tachypneic with significant hypoxia for several days, continues worsening.  - Continue remdesivir, steroids, s/p tocilizumab 9/29. Pt declined convalescent plasma. - Stopped antibiotics with undetectable procalcitonin and low clinical suspicion for bacterial PNA. - CTA chest again ordered to inform anticoagulation decisions going forward. D-dimer trending downward though this is in light of continued severe hypoxia and full dose anticoagulation.  - Continue airborne, contact precautions. PPE including surgical gown, gloves,  cap, shoe covers, and CAPR used during this encounter in a negative pressure room.  - Check daily labs: CBC w/diff, CMP, d-dimer, ferritin, CRP - Avoid NSAIDs - Recommend proning and aggressive use of incentive spirometry. Pt not comfortable with proning.  - Had brisk diuresis response to lasix (4L net out/24hrs). Appears euvolemic currently, will not repeat dose today. - Goals of care were discussed. Prognosis is guarded.   HTN: Remains elevated in light of holding ACEi and thiazide.  - Continue increased dose norvasc - Started scheduled po hydralazine - Increase lisinopril back to home dose 10mg .  - Started metoprolol 50mg  BID - Continue prn hydralazine with attention to the history of cerebral artery aneurysm.   AKI: Resolved. Cr 1.43 initially, since improved to wnl.  - Ok to give contrast since risk < benefit.  - Monitor BMP  Obesity: BMI 36. Negative impact on prognosis  History of ruptured cerebral artery aneurysm: Control BP  DVT prophylaxis: Lovenox Code Status: Full Family Communication: None at bedside Disposition Plan: Remain in PCU. Uncertain timing of discharge, dependent on clinical improvement.  Consultants:   PCCM  Procedures:   None  Antimicrobials:  Remdesivir 9/28 - 10/2  CTX 9/28 - 10/1  Azithromycin 9/28 - 9/29    Subjective: Relatively unchanged shortness of breath with any exertion over the past couple days. No fevers, cough is about the same without blood, some sputum consistently. No leg swelling.   Objective: Vitals:   11/17/18 0621 11/17/18 0646 11/17/18 0800 11/17/18 0923  BP: (!) 173/96  (!) 168/92 (!) 146/79  Pulse:  92 95   Resp:  (!) 24 20 (!) 22  Temp:   98.7 F (37.1 C)  TempSrc:   Oral   SpO2:  98% 90% 93%  Weight:      Height:        Intake/Output Summary (Last 24 hours) at 11/17/2018 0957 Last data filed at 11/17/2018 [id number] Gross per 24 hour  Intake 360 ml  Output 5151 ml  Net -4791 ml   Filed Weights   11/05/2018  1341 11/16/18 0259  Weight: 99.8 kg 98.9 kg   Gen: 63 y.o. female in no distress Pulm: Nonlabored tachypnea, 90% on 15L HFNC. Crackles diffusely. CV: Regular rate and rhythm. No murmur, rub, or gallop. No JVD, no dependent edema. GI: Abdomen soft, non-tender, non-distended, with normoactive bowel sounds.  Ext: Warm, no deformities Skin: No rashes, lesions or ulcers on visualized skin. Neuro: Alert and oriented. No focal neurological deficits. Psych: Judgement and insight appear fair. Mood euthymic & affect congruent. Behavior is appropriate.    Data Reviewed: I have personally reviewed following labs and imaging studies  CBC: Recent Labs  Lab 11/13/18 0255 11/14/18 0435 11/15/18 0453 11/16/18 0425 11/17/18 0135  WBC 7.1 11.1* 11.5* 12.9* 12.4*  NEUTROABS 5.9 9.2* 9.7* 10.7* 10.6*  HGB 12.4 12.0 11.9* 12.9 14.0  HCT 39.8 38.8 37.3 40.9 43.0  MCV 88.1 89.0 87.6 86.8 86.3  PLT 370 412* 341 410* 390   Basic Metabolic Panel: Recent Labs  Lab 11/13/18 0255 11/14/18 0435 11/15/18 0453 11/16/18 0425 11/17/18 0135  NA 142 142 140 138 139  K 4.1 4.3 4.0 3.8 4.2  CL 109 113* 110 104 104  CO2 21* 22 22 23 26   GLUCOSE 140* 133* 140* 122* 138*  BUN 20 21 16 18 23   CREATININE 0.96 0.80 0.73 0.91 0.87  CALCIUM 8.3* 8.2* 8.0* 8.2* 8.5*  MG 2.3 2.4 2.3 2.2 2.2  PHOS 2.1* 2.4* 2.2* 2.2* 2.8   GFR: Estimated Creatinine Clearance: 77.1 mL/min (by C-G formula based on SCr of 0.87 mg/dL). Liver Function Tests: Recent Labs  Lab 11/13/18 0255 11/14/18 0435 11/15/18 0453 11/16/18 0425 11/17/18 0135  AST 139* 62* 38 41 50*  ALT 129* 104* 73* 70* 68*  ALKPHOS 64 61 63 78 82  BILITOT 0.6 0.7 0.5 0.8 1.0  PROT 7.5 7.0 6.6 6.8 6.7  ALBUMIN 2.7* 2.7* 2.6* 2.8* 2.9*   No results for input(s): LIPASE, AMYLASE in the last 168 hours. No results for input(s): AMMONIA in the last 168 hours. Coagulation Profile: No results for input(s): INR, PROTIME in the last 168 hours. Cardiac  Enzymes: No results for input(s): CKTOTAL, CKMB, CKMBINDEX, TROPONINI in the last 168 hours. BNP (last 3 results) No results for input(s): PROBNP in the last 8760 hours. HbA1C: No results for input(s): HGBA1C in the last 72 hours. CBG: No results for input(s): GLUCAP in the last 168 hours. Lipid Profile: No results for input(s): CHOL, HDL, LDLCALC, TRIG, CHOLHDL, LDLDIRECT in the last 72 hours. Thyroid Function Tests: No results for input(s): TSH, T4TOTAL, FREET4, T3FREE, THYROIDAB in the last 72 hours. Anemia Panel: Recent Labs    11/15/18 0453 11/16/18 0425  FERRITIN 489* 518*   Urine analysis:    Component Value Date/Time   COLORURINE YELLOW 11/15/2018 2215   APPEARANCEUR HAZY (A) 11/15/2018 2215   LABSPEC 1.018 11/15/2018 2215   PHURINE 6.0 11/15/2018 2215   GLUCOSEU NEGATIVE 11/15/2018 2215   HGBUR NEGATIVE 11/15/2018 2215   HGBUR trace-intact 11/14/2006 0829   BILIRUBINUR NEGATIVE 11/15/2018 2215   KETONESUR NEGATIVE 11/15/2018 2215   PROTEINUR NEGATIVE 11/15/2018 2215   UROBILINOGEN 1.0  04/06/2014 1816   NITRITE NEGATIVE 11/15/2018 2215   LEUKOCYTESUR NEGATIVE 11/15/2018 2215   Recent Results (from the past 240 hour(s))  SARS Coronavirus 2 [facility] order, Performed in [facility] hospital lab) Nasopharyngeal Nasopharyngeal Swab     Status: Abnormal   Collection Time: 10/24/2018  1:43 PM   Specimen: Nasopharyngeal Swab  Result Value Ref Range Status   SARS Coronavirus 2 POSITIVE (A) NEGATIVE Final    Comment: RESULT CALLED TO, READ BACK BY AND VERIFIED WITH: Higinio Roger RN 15:15 10/18/2018 ([email or web address]) (NOTE) If result is NEGATIVE SARS-CoV-2 target nucleic acids are NOT DETECTED. The SARS-CoV-2 RNA is generally detectable in upper and lower  respiratory specimens during the acute phase of infection. The lowest  concentration of SARS-CoV-2 viral copies this assay can detect is 250  copies / mL. A negative result does not preclude SARS-CoV-2 infection  and should not be  used as the sole basis for treatment or other  patient management decisions.  A negative result may occur with  improper specimen collection / handling, submission of specimen other  than nasopharyngeal swab, presence of viral mutation(s) within the  areas targeted by this assay, and inadequate number of viral copies  (<250 copies / mL). A negative result must be combined with clinical  observations, patient history, and epidemiological information. If result is POSITIVE SARS-CoV-2 target nucleic acids are DETECTED.  The SARS-CoV-2 RNA is generally detectable in upper and lower  respiratory specimens during the acute phase of infection.  Positive  results are indicative of active infection with SARS-CoV-2.  Clinical  correlation with patient history and other diagnostic information is  necessary to determine patient infection status.  Positive results do  not rule out bacterial infection or co-infection with other viruses. If result is PRESUMPTIVE POSTIVE SARS-CoV-2 nucleic acids MAY BE PRESENT.   A presumptive positive result was obtained on the submitted specimen  and confirmed on repeat testing.  While 2019 novel coronavirus  (SARS-CoV-2) nucleic acids may be present in the submitted sample  additional confirmatory testing may be necessary for epidemiological  and / or clinical management purposes  to differentiate between  SARS-CoV-2 and other Sarbecovirus currently known to infect humans.  If clinically indicated additional testing with an alternate test  methodology [phone]) i s advised. The SARS-CoV-2 RNA is generally  detectable in upper and lower respiratory specimens during the acute  phase of infection. The expected result is Negative. Fact Sheet for Patients:  BoilerBrush.com.cy Fact Sheet for Healthcare Providers: https://pope.com/ This test is not yet approved or cleared by the [place] FDA and has been authorized  for detection and/or diagnosis of SARS-CoV-2 by FDA under an Emergency Use Authorization (EUA).  This EUA will remain in effect (meaning this test can be used) for the duration of the COVID-19 declaration under Section 564(b)(1) of the Act, 21 U.S.C. section 360bbb-3(b)(1), unless the authorization is terminated or revoked sooner. Performed at [facility], [address] [place], [place] [address]       Radiology Studies: Dg Chest Port 1 View  Result Date: 11/16/2018 CLINICAL DATA:  COVID EXAM: PORTABLE CHEST 1 VIEW COMPARISON:  10/27/2018 FINDINGS: Diffuse interstitial and airspace opacities noted. More focal confluent opacities in the lung bases. Overall airspace disease slightly increased since prior study. No effusions. No acute bony abnormality. IMPRESSION: Diffuse interstitial and alveolar opacities, most confluent in the lung bases, increased since prior study. Electronically Signed   By: Charlett Nose M.D.   On: 11/16/2018 08:36  Vas [place] Lower Extremity Venous (dvt)  Result Date: 11/15/2018  Lower Venous Study Indications: Elevated Ddimer.  Risk Factors: COVID 19 positive. Comparison Study: No prior studies. Performing Technologist: Chanda Busing RVT  Examination Guidelines: A complete evaluation includes B-mode imaging, spectral Doppler, color Doppler, and power Doppler as needed of all accessible portions of each vessel. Bilateral testing is considered an integral part of a complete examination. Limited examinations for reoccurring indications may be performed as noted.  +---------+---------------+---------+-----------+----------+--------------+  RIGHT     Compressibility Phasicity Spontaneity Properties Thrombus Aging  +---------+---------------+---------+-----------+----------+--------------+  CFV       Full            Yes       Yes                                    +---------+---------------+---------+-----------+----------+--------------+  SFJ       Full                                                              +---------+---------------+---------+-----------+----------+--------------+  FV Prox   Full                                                             +---------+---------------+---------+-----------+----------+--------------+  FV Mid    Full                                                             +---------+---------------+---------+-----------+----------+--------------+  FV Distal Full                                                             +---------+---------------+---------+-----------+----------+--------------+  PFV       Full                                                             +---------+---------------+---------+-----------+----------+--------------+  POP       Full            Yes       Yes                                    +---------+---------------+---------+-----------+----------+--------------+  PTV       Full                                                             +---------+---------------+---------+-----------+----------+--------------+  PERO      Full                                                             +---------+---------------+---------+-----------+----------+--------------+   +---------+---------------+---------+-----------+----------+--------------+  LEFT      Compressibility Phasicity Spontaneity Properties Thrombus Aging  +---------+---------------+---------+-----------+----------+--------------+  CFV       Full            Yes       Yes                                    +---------+---------------+---------+-----------+----------+--------------+  SFJ       Full                                                             +---------+---------------+---------+-----------+----------+--------------+  FV Prox   Full                                                             +---------+---------------+---------+-----------+----------+--------------+  FV Mid    Full                                                              +---------+---------------+---------+-----------+----------+--------------+  FV Distal Full                                                             +---------+---------------+---------+-----------+----------+--------------+  PFV       Full                                                             +---------+---------------+---------+-----------+----------+--------------+  POP       Full            Yes       Yes                                    +---------+---------------+---------+-----------+----------+--------------+  PTV       Full                                                             +---------+---------------+---------+-----------+----------+--------------+  PERO      Full                                                             +---------+---------------+---------+-----------+----------+--------------+     Summary: Right: There is no evidence of deep vein thrombosis in the lower extremity. No cystic structure found in the popliteal fossa. Left: There is no evidence of deep vein thrombosis in the lower extremity. No cystic structure found in the popliteal fossa.  *See table(s) above for measurements and observations. Electronically signed by Servando Snare MD on 11/15/2018 at 5:45:26 PM.    Final     Scheduled Meds:  amLODipine  10 mg Oral Daily   Chlorhexidine Gluconate Cloth  6 each Topical Daily   enoxaparin (LOVENOX) injection  100 mg Subcutaneous Q12H   hydrALAZINE  50 mg Oral Q6H   lisinopril  2.5 mg Oral Daily   methylPREDNISolone (SOLU-MEDROL) injection  60 mg Intravenous Q12H   metoprolol tartrate  50 mg Oral BID   sodium chloride flush  3 mL Intravenous Q12H   Continuous Infusions:  sodium chloride Stopped (11/13/18 1159)     LOS: 5 days   Time spent: 35 minutes.  Patrecia Pour, MD [facility] [email or web address] Password [email or web address] 11/17/2018, 9:57 AM

## 2018-11-17 NOTE — Plan of Care (Signed)
  Problem: Education: Goal: Knowledge of General Education information will improve Description: Including pain rating scale, medication(s)/side effects and non-pharmacologic comfort measures Outcome: Progressing   Problem: Clinical Measurements: Goal: Respiratory complications will improve Outcome: Progressing   Problem: Clinical Measurements: Goal: Cardiovascular complication will be avoided Outcome: Progressing   Problem: Activity: Goal: Risk for activity intolerance will decrease Outcome: Progressing   Problem: Pain Managment: Goal: General experience of comfort will improve Outcome: Progressing   Problem: Coping: Goal: Level of anxiety will decrease Outcome: Progressing   Problem: Nutrition: Goal: Adequate nutrition will be maintained Outcome: Progressing   Problem: Education: Goal: Knowledge of General Education information will improve Description: Including pain rating scale, medication(s)/side effects and non-pharmacologic comfort measures Outcome: Progressing  Continue w POC

## 2018-11-17 NOTE — Progress Notes (Signed)
Pt has rested through the night. She denied any pain, does have a cough. Tachypneic from 20-40's depending on activity level. Pt saturations 86-95% on 15LHFNC while resting in bed. She got up several times to use the bedside commode, while not reporting increased SOB, her saturations would drop to the 60- 70's on HFNC with activity, and take several minutes to recover once returning to resting in bed. When placed on NRB with activity, would remain in the 90's. Pt given PRN Hydralazine x 2 for SBP >160 during this shift. Encouraged proning activity, but pt reporting she could not tolerate full proning position. Continued to reinforce IS use and coughing/deep breathing exercises.

## 2018-11-17 NOTE — Progress Notes (Signed)
Patient transported to [facility] per MD order for CT scan of chest due to no enabled CT scan services at [facility]. VS stable, pt in no acute distress at time of transfer. Patient's daughter Ellison Hughs had previously been updated via telephone by this Probation officer regarding POC. No further questions at this time

## 2018-11-17 NOTE — Progress Notes (Signed)
Patient returned to unit via transport. VS obtained, CT scan negative for PE. Pt in no acute distress at this time, will continue to monitor remainder of this writer's shift.

## 2018-11-18 LAB — CBC WITH DIFFERENTIAL/PLATELET
Abs Immature Granulocytes: 0.18 10*3/uL — ABNORMAL HIGH (ref 0.00–0.07)
Basophils Absolute: 0 10*3/uL (ref 0.0–0.1)
Basophils Relative: 0 %
Eosinophils Absolute: 0 10*3/uL (ref 0.0–0.5)
Eosinophils Relative: 0 %
HCT: 42 % (ref 36.0–46.0)
Hemoglobin: 13.7 g/dL (ref 12.0–15.0)
Immature Granulocytes: 1 %
Lymphocytes Relative: 5 %
Lymphs Abs: 0.7 10*3/uL (ref 0.7–4.0)
MCH: 28 pg (ref 26.0–34.0)
MCHC: 32.6 g/dL (ref 30.0–36.0)
MCV: 85.7 fL (ref 80.0–100.0)
Monocytes Absolute: 0.5 10*3/uL (ref 0.1–1.0)
Monocytes Relative: 4 %
Neutro Abs: 11.7 10*3/uL — ABNORMAL HIGH (ref 1.7–7.7)
Neutrophils Relative %: 90 %
Platelets: 316 10*3/uL (ref 150–400)
RBC: 4.9 MIL/uL (ref 3.87–5.11)
RDW: 13.6 % (ref 11.5–15.5)
WBC: 13.1 10*3/uL — ABNORMAL HIGH (ref 4.0–10.5)
nRBC: 0 % (ref 0.0–0.2)

## 2018-11-18 LAB — COMPREHENSIVE METABOLIC PANEL
ALT: 85 U/L — ABNORMAL HIGH (ref 0–44)
AST: 70 U/L — ABNORMAL HIGH (ref 15–41)
Albumin: 2.8 g/dL — ABNORMAL LOW (ref 3.5–5.0)
Alkaline Phosphatase: 82 U/L (ref 38–126)
Anion gap: 9 (ref 5–15)
BUN: 25 mg/dL — ABNORMAL HIGH (ref 8–23)
CO2: 25 mmol/L (ref 22–32)
Calcium: 8.2 mg/dL — ABNORMAL LOW (ref 8.9–10.3)
Chloride: 104 mmol/L (ref 98–111)
Creatinine, Ser: 0.92 mg/dL (ref 0.44–1.00)
GFR calc Af Amer: >60 mL/min (ref >60)
GFR calc non Af Amer: >60 mL/min (ref >60)
Glucose, Bld: 136 mg/dL — ABNORMAL HIGH (ref 70–99)
Potassium: 4.2 mmol/L (ref 3.5–5.1)
Sodium: 138 mmol/L (ref 135–145)
Total Bilirubin: 0.7 mg/dL (ref 0.3–1.2)
Total Protein: 6.4 g/dL — ABNORMAL LOW (ref 6.5–8.1)

## 2018-11-18 LAB — C-REACTIVE PROTEIN: CRP: 1 mg/dL — ABNORMAL HIGH (ref <1.0)

## 2018-11-18 LAB — D-DIMER, QUANTITATIVE: D-Dimer, Quant: 10.19 ug/mL-FEU — ABNORMAL HIGH (ref 0.00–0.50)

## 2018-11-18 LAB — PHOSPHORUS: Phosphorus: 3.4 mg/dL (ref 2.5–4.6)

## 2018-11-18 MED ORDER — LISINOPRIL 20 MG PO TABS
20.0000 mg | ORAL_TABLET | Freq: Every day | ORAL | Status: DC
  Administered 2018-11-18 – 2018-11-22 (×5): 20 mg via ORAL
  Filled 2018-11-18 (×5): qty 1

## 2018-11-18 MED ORDER — ENOXAPARIN SODIUM 60 MG/0.6ML ~~LOC~~ SOLN: 0.5000 mg/kg | SUBCUTANEOUS | Status: DC

## 2018-11-18 MED ORDER — ENOXAPARIN SODIUM 60 MG/0.6ML ~~LOC~~ SOLN
50.0000 mg | Freq: Two times a day (BID) | SUBCUTANEOUS | Status: DC
  Administered 2018-11-18 – 2018-11-22 (×9): 50 mg via SUBCUTANEOUS
  Filled 2018-11-18 (×9): qty 0.6

## 2018-11-18 NOTE — Progress Notes (Signed)
Pt daughter, Lelon Huh, updated via phone. All questions answered. Pt provided with phone to speak with daughter as well.

## 2018-11-18 NOTE — Progress Notes (Signed)
Patient continues to utilize Non-rebreather when transfer to [facility] @15L . Once setttled pt can return to HFNC @ 14-15 L. Pt desats into high 70's-80's with movement. MD Bonner Puna is aware, continue w POC, will monitor throughout rest of scheduled shift

## 2018-11-18 NOTE — Progress Notes (Signed)
PROGRESS NOTE  Shelley Allison  ZOX:[id number] DOB: February 17, 1955 DOA: 2018/12/02 PCP: Leeroy Bock, DO   Brief Narrative: Shelley Allison is a 63 y.o. female with a history of HTN and cerebral artery aneurysm rupture who presented to the ED 9/28 with about 1 week of N/V/D followed by progressive shortness of breath with associated fever starting 9/27. Symptoms started around the time of a large wedding she attended. She was found to be hypoxic requiring 6L O2 with positive SARS-CoV-2 testing, elevated inflammatory markers (CRP 20), and reticular CXR infiltrates, possibly left base infiltrate. She was admitted to [facility], started on steroids, and remdesivir 9/28, given tocilizumab on 9/29 when hypoxia worsened. D-dimer has risen from 2.93 to 6.72, so CTA chest is ordered. Due to progressive hypoxia and tachypnea, the patient will be transferred to the ICU 9/30.  Assessment & Plan: Principal Problem:   Respiratory tract infection due to COVID-19 virus Active Problems:   OBESITY, NOS   HYPERTENSION, BENIGN SYSTEMIC   CEREBRAL ANEURYSM, RUPTURED   Pneumonia due to COVID-19 virus   Acute respiratory failure with hypoxia (HCC)   Acute pulmonary embolus (HCC)   Essential hypertension   AKI (acute kidney injury) (HCC)   Morbid obesity with BMI of 70 and over, adult (HCC)   CAP (community acquired pneumonia)  Acute hypoxic respiratory failure due to covid-19 pneumonia: Remains tachypneic with significant hypoxia for several days, continues worsening.  - Continue remdesivir, steroids, s/p tocilizumab 9/29. Pt declined convalescent plasma and CRP now down from 20 to 1.  - Stopped antibiotics with undetectable procalcitonin and low clinical suspicion for bacterial PNA. - CTA chest performed 10/3. On my personal review, the contrast is well timed and no PE is noted. GGOs multifocal consistent with covid without lobar consolidation to suggest bacterial PNA. Deescalate anticoagulation from treatment dose  to 0.5mg /kg q12h due to continued d-dimer elevation. D/w pt who denies bleeding.  - Continue airborne, contact precautions. PPE including surgical gown, gloves, cap, shoe covers, and CAPR used during this encounter in a negative pressure room.  - Check daily labs: CBC w/diff, CMP, d-dimer, ferritin, CRP - Avoid NSAIDs - Recommend proning and aggressive use of incentive spirometry. Pt not comfortable with proning.  - Had brisk diuresis response to lasix (4L net out/24hrs). Appears euvolemic currently, will not repeat dose today. - Goals of care were discussed. Prognosis is guarded.   HTN: Remains elevated. - Continue increased dose norvasc - Started scheduled po hydralazine, continue at  dose - Increased lisinopril back to home dose . Increase further to  today.  - Started metoprolol  BID - Continue prn hydralazine with attention to the history of cerebral artery aneurysm.   AKI: Resolved. Cr 1.43 initially, since improved to wnl.  - Ok to give contrast since risk < benefit. Cr stable 24hrs post contrast, monitor 1 more day for CIN. - Monitor BMP  Obesity: BMI 36. Negative impact on prognosis  History of ruptured cerebral artery aneurysm: Control BP  LFT elevation:  - ?remdesivir vs. viral effect without cholestatic findings. Continue to trend without further work up for the time being.  DVT prophylaxis: Lovenox Code Status: Full Family Communication: None at bedside Disposition Plan: Remain in PCU. Uncertain timing of discharge, dependent on clinical improvement. Still requiring 15L HFNC but comfortable-appearing.  Consultants:   PCCM  Procedures:   None  Antimicrobials:  Remdesivir 9/28 - 10/2  CTX 9/28 - 10/1  Azithromycin 9/28 - 9/29    Subjective: Feels generally better,  denies shortness of breath at rest. Able to rest. +Cough, no chest pain or hemoptysis   Objective: Vitals:   11/17/18 2027 11/18/18 0025 11/18/18 0400 11/18/18 0827  BP: (!)  178/94 (!) 148/75 (!) 141/75 (!) 170/94  Pulse: 91 76 67 92  Resp: 20 19 (!) 22 20  Temp: 98.8 F (37.1 C) 98.3 F (36.8 C) 98.4 F (36.9 C) 97.8 F (36.6 C)  TempSrc: Oral Tympanic Oral Oral  SpO2: 95% 92% 94% 90%  Weight:      Height:        Intake/Output Summary (Last 24 hours) at 11/18/2018 0859 Last data filed at 11/18/2018 0630 Gross per 24 hour  Intake 720 ml  Output 1300 ml  Net -580 ml   Filed Weights   11/05/2018 1341 11/16/18 0259  Weight: 99.8 kg 98.9 kg   Gen: 63 y.o. female in no distress Pulm: Nonlabored breathing 15L HFNC. Crackles diffusely without wheezing. CV: Regular rate and rhythm. No murmur, rub, or gallop. No JVD, no dependent edema. GI: Abdomen soft, non-tender, non-distended, with normoactive bowel sounds.  Ext: Warm, no deformities Skin: No rashes, lesions or ulcers on visualized skin. Neuro: Alert and oriented. No focal neurological deficits. Psych: Judgement and insight appear fair. Mood euthymic & affect congruent. Behavior is appropriate.    Data Reviewed: I have personally reviewed following labs and imaging studies  CBC: Recent Labs  Lab 11/14/18 0435 11/15/18 0453 11/16/18 0425 11/17/18 0135 11/18/18 0114  WBC 11.1* 11.5* 12.9* 12.4* 13.1*  NEUTROABS 9.2* 9.7* 10.7* 10.6* 11.7*  HGB 12.0 11.9* 12.9 14.0 13.7  HCT 38.8 37.3 40.9 43.0 42.0  MCV 89.0 87.6 86.8 86.3 85.7  PLT 412* 341 410* 390 316   Basic Metabolic Panel: Recent Labs  Lab 11/13/18 0255 11/14/18 0435 11/15/18 0453 11/16/18 0425 11/17/18 0135 11/18/18 0114  NA 142 142 140 138 139 138  K 4.1 4.3 4.0 3.8 4.2 4.2  CL 109 113* 110 104 104 104  CO2 21* 22 22 23 26 25   GLUCOSE 140* 133* 140* 122* 138* 136*  BUN 20 21 16 18 23  25*  CREATININE 0.96 0.80 0.73 0.91 0.87 0.92  CALCIUM 8.3* 8.2* 8.0* 8.2* 8.5* 8.2*  MG 2.3 2.4 2.3 2.2 2.2  --   PHOS 2.1* 2.4* 2.2* 2.2* 2.8 3.4   GFR: Estimated Creatinine Clearance: 72.9 mL/min (by C-G formula based on SCr of 0.92  mg/dL). Liver Function Tests: Recent Labs  Lab 11/14/18 0435 11/15/18 0453 11/16/18 0425 11/17/18 0135 11/18/18 0114  AST 62* 38 41 50* 70*  ALT 104* 73* 70* 68* 85*  ALKPHOS 61 63 78 82 82  BILITOT 0.7 0.5 0.8 1.0 0.7  PROT 7.0 6.6 6.8 6.7 6.4*  ALBUMIN 2.7* 2.6* 2.8* 2.9* 2.8*   No results for input(s): LIPASE, AMYLASE in the last 168 hours. No results for input(s): AMMONIA in the last 168 hours. Coagulation Profile: No results for input(s): INR, PROTIME in the last 168 hours. Cardiac Enzymes: No results for input(s): CKTOTAL, CKMB, CKMBINDEX, TROPONINI in the last 168 hours. BNP (last 3 results) No results for input(s): PROBNP in the last 8760 hours. HbA1C: No results for input(s): HGBA1C in the last 72 hours. CBG: No results for input(s): GLUCAP in the last 168 hours. Lipid Profile: No results for input(s): CHOL, HDL, LDLCALC, TRIG, CHOLHDL, LDLDIRECT in the last 72 hours. Thyroid Function Tests: No results for input(s): TSH, T4TOTAL, FREET4, T3FREE, THYROIDAB in the last 72 hours. Anemia Panel: Recent Labs  11/16/18 0425  FERRITIN 518*   Urine analysis:    Component Value Date/Time   COLORURINE YELLOW 11/15/2018 2215   APPEARANCEUR HAZY (A) 11/15/2018 2215   LABSPEC 1.018 11/15/2018 2215   PHURINE 6.0 11/15/2018 2215   GLUCOSEU NEGATIVE 11/15/2018 2215   HGBUR NEGATIVE 11/15/2018 2215   HGBUR trace-intact 11/14/2006 0829   BILIRUBINUR NEGATIVE 11/15/2018 2215   KETONESUR NEGATIVE 11/15/2018 2215   PROTEINUR NEGATIVE 11/15/2018 2215   UROBILINOGEN 1.0 04/06/2014 1816   NITRITE NEGATIVE 11/15/2018 2215   LEUKOCYTESUR NEGATIVE 11/15/2018 2215   Recent Results (from the past 240 hour(s))  SARS Coronavirus 2 [facility] order, Performed in [facility] hospital lab) Nasopharyngeal Nasopharyngeal Swab     Status: Abnormal   Collection Time: 2018-11-25  1:43 PM   Specimen: Nasopharyngeal Swab  Result Value Ref Range Status   SARS Coronavirus 2 POSITIVE (A)  NEGATIVE Final    Comment: RESULT CALLED TO, READ BACK BY AND VERIFIED WITH: Higinio Roger RN 15:15 2018/11/25 ([email or web address]) (NOTE) If result is NEGATIVE SARS-CoV-2 target nucleic acids are NOT DETECTED. The SARS-CoV-2 RNA is generally detectable in upper and lower  respiratory specimens during the acute phase of infection. The lowest  concentration of SARS-CoV-2 viral copies this assay can detect is 250  copies / mL. A negative result does not preclude SARS-CoV-2 infection  and should not be used as the sole basis for treatment or other  patient management decisions.  A negative result may occur with  improper specimen collection / handling, submission of specimen other  than nasopharyngeal swab, presence of viral mutation(s) within the  areas targeted by this assay, and inadequate number of viral copies  (<250 copies / mL). A negative result must be combined with clinical  observations, patient history, and epidemiological information. If result is POSITIVE SARS-CoV-2 target nucleic acids are DETECTED.  The SARS-CoV-2 RNA is generally detectable in upper and lower  respiratory specimens during the acute phase of infection.  Positive  results are indicative of active infection with SARS-CoV-2.  Clinical  correlation with patient history and other diagnostic information is  necessary to determine patient infection status.  Positive results do  not rule out bacterial infection or co-infection with other viruses. If result is PRESUMPTIVE POSTIVE SARS-CoV-2 nucleic acids MAY BE PRESENT.   A presumptive positive result was obtained on the submitted specimen  and confirmed on repeat testing.  While 2019 novel coronavirus  (SARS-CoV-2) nucleic acids may be present in the submitted sample  additional confirmatory testing may be necessary for epidemiological  and / or clinical management purposes  to differentiate between  SARS-CoV-2 and other Sarbecovirus currently known to infect humans.  If  clinically indicated additional testing with an alternate test  methodology [phone]) i s advised. The SARS-CoV-2 RNA is generally  detectable in upper and lower respiratory specimens during the acute  phase of infection. The expected result is Negative. Fact Sheet for Patients:  BoilerBrush.com.cy Fact Sheet for Healthcare Providers: https://pope.com/ This test is not yet approved or cleared by the [place] FDA and has been authorized for detection and/or diagnosis of SARS-CoV-2 by FDA under an Emergency Use Authorization (EUA).  This EUA will remain in effect (meaning this test can be used) for the duration of the COVID-19 declaration under Section 564(b)(1) of the Act, 21 U.S.C. section 360bbb-3(b)(1), unless the authorization is terminated or revoked sooner. Performed at [facility], [address] [place], [place] [address]       Radiology Studies:  Ct Angio Chest Pe W Or Wo Contrast  Result Date: 11/17/2018 CLINICAL DATA:  Positive D-dimer. COVID-19 positive. Shortness of breath. Possible pulmonary embolism. EXAM: CT ANGIOGRAPHY CHEST WITH CONTRAST TECHNIQUE: Multidetector CT imaging of the chest was performed using the standard protocol during bolus administration of intravenous contrast. Multiplanar CT image reconstructions and MIPs were obtained to evaluate the vascular anatomy. CONTRAST:  31mL OMNIPAQUE IOHEXOL 350 MG/ML SOLN COMPARISON:  None. FINDINGS: Cardiovascular: Mild cardiomegaly. Thoracic aorta is within normal. Pulmonary arterial system is well opacified without evidence of emboli. Remaining vascular structures are unremarkable. Mediastinum/Nodes: 1.5 cm precarinal lymph node and 1.1 cm subcarinal lymph node likely reactive. Remaining mediastinal structures are unremarkable. Lungs/Pleura: Lungs are adequately inflated demonstrate moderate bilateral patchy consolidative and ground-glass airspace process worse over  the mid to upper lungs likely multifocal pneumonia. No evidence of effusion. Airways are normal. Upper Abdomen: No acute findings. Musculoskeletal: Mild degenerative change of the spine. Review of the MIP images confirms the above findings. IMPRESSION: 1.  No evidence of pulmonary embolism. 2. Moderate bilateral patchy multifocal airspace process likely multifocal pneumonia. Mild mediastinal reactive adenopathy. 3.  Mild cardiomegaly. Electronically Signed   By: Marin Olp M.D.   On: 11/17/2018 11:57    Scheduled Meds:  amLODipine  10 mg Oral Daily   Chlorhexidine Gluconate Cloth  6 each Topical Daily   enoxaparin (LOVENOX) injection  50 mg Subcutaneous Q12H   hydrALAZINE  50 mg Oral Q6H   lisinopril  20 mg Oral Daily   methylPREDNISolone (SOLU-MEDROL) injection  60 mg Intravenous Q12H   metoprolol tartrate  50 mg Oral BID   sodium chloride flush  3 mL Intravenous Q12H   Continuous Infusions:  sodium chloride Stopped (11/13/18 1159)     LOS: 6 days   Time spent: 35 minutes.  Patrecia Pour, MD [facility] [email or web address] Password [email or web address] 11/18/2018, 8:59 AM

## 2018-11-18 NOTE — Plan of Care (Signed)
  Problem: Education: Goal: Knowledge of General Education information will improve Description: Including pain rating scale, medication(s)/side effects and non-pharmacologic comfort measures Outcome: Progressing   Problem: Clinical Measurements: Goal: Respiratory complications will improve Outcome: Progressing   Problem: Clinical Measurements: Goal: Cardiovascular complication will be avoided Outcome: Progressing   Problem: Activity: Goal: Risk for activity intolerance will decrease Outcome: Progressing   Problem: Nutrition: Goal: Adequate nutrition will be maintained Outcome: Progressing  This Probation officer spoke with pt's daughter Laqueta Linden this AM for update on POC. Patient to continue to work on activity tolerance free of respiratory distress.

## 2018-11-19 LAB — CBC WITH DIFFERENTIAL/PLATELET
Abs Immature Granulocytes: 0.18 10*3/uL — ABNORMAL HIGH (ref 0.00–0.07)
Basophils Absolute: 0 10*3/uL (ref 0.0–0.1)
Basophils Relative: 0 %
Eosinophils Absolute: 0 10*3/uL (ref 0.0–0.5)
Eosinophils Relative: 0 %
HCT: 44.1 % (ref 36.0–46.0)
Hemoglobin: 14.1 g/dL (ref 12.0–15.0)
Immature Granulocytes: 1 %
Lymphocytes Relative: 6 %
Lymphs Abs: 0.8 10*3/uL (ref 0.7–4.0)
MCH: 27.9 pg (ref 26.0–34.0)
MCHC: 32 g/dL (ref 30.0–36.0)
MCV: 87.2 fL (ref 80.0–100.0)
Monocytes Absolute: 0.6 10*3/uL (ref 0.1–1.0)
Monocytes Relative: 4 %
Neutro Abs: 13.1 10*3/uL — ABNORMAL HIGH (ref 1.7–7.7)
Neutrophils Relative %: 89 %
Platelets: 293 10*3/uL (ref 150–400)
RBC: 5.06 MIL/uL (ref 3.87–5.11)
RDW: 13.9 % (ref 11.5–15.5)
WBC: 14.7 10*3/uL — ABNORMAL HIGH (ref 4.0–10.5)
nRBC: 0 % (ref 0.0–0.2)

## 2018-11-19 LAB — COMPREHENSIVE METABOLIC PANEL
ALT: 100 U/L — ABNORMAL HIGH (ref 0–44)
AST: 67 U/L — ABNORMAL HIGH (ref 15–41)
Albumin: 2.7 g/dL — ABNORMAL LOW (ref 3.5–5.0)
Alkaline Phosphatase: 85 U/L (ref 38–126)
Anion gap: 13 (ref 5–15)
BUN: 27 mg/dL — ABNORMAL HIGH (ref 8–23)
CO2: 23 mmol/L (ref 22–32)
Calcium: 8.3 mg/dL — ABNORMAL LOW (ref 8.9–10.3)
Chloride: 102 mmol/L (ref 98–111)
Creatinine, Ser: 0.87 mg/dL (ref 0.44–1.00)
GFR calc Af Amer: >60 mL/min (ref >60)
GFR calc non Af Amer: >60 mL/min (ref >60)
Glucose, Bld: 105 mg/dL — ABNORMAL HIGH (ref 70–99)
Potassium: 4.8 mmol/L (ref 3.5–5.1)
Sodium: 138 mmol/L (ref 135–145)
Total Bilirubin: 0.6 mg/dL (ref 0.3–1.2)
Total Protein: 6.1 g/dL — ABNORMAL LOW (ref 6.5–8.1)

## 2018-11-19 LAB — C-REACTIVE PROTEIN: CRP: <0.8 mg/dL (ref <1.0)

## 2018-11-19 LAB — D-DIMER, QUANTITATIVE: D-Dimer, Quant: 10.29 ug/mL-FEU — ABNORMAL HIGH (ref 0.00–0.50)

## 2018-11-19 MED ORDER — HYDRALAZINE HCL 20 MG/ML IJ SOLN
20.0000 mg | INTRAMUSCULAR | Status: DC | PRN
  Administered 2018-11-19 – 2018-12-04 (×3): 20 mg via INTRAVENOUS
  Filled 2018-11-19 (×4): qty 1

## 2018-11-19 MED ORDER — HYDRALAZINE HCL 50 MG PO TABS
75.0000 mg | ORAL_TABLET | Freq: Four times a day (QID) | ORAL | Status: DC
  Administered 2018-11-19 – 2018-11-22 (×12): 75 mg via ORAL
  Filled 2018-11-19 (×12): qty 2

## 2018-11-19 MED ORDER — HYDROCHLOROTHIAZIDE 25 MG PO TABS
25.0000 mg | ORAL_TABLET | Freq: Every day | ORAL | Status: DC
  Administered 2018-11-19 – 2018-11-21 (×3): 25 mg via ORAL
  Filled 2018-11-19 (×3): qty 1

## 2018-11-19 NOTE — Progress Notes (Signed)
PROGRESS NOTE  Shelley Coenita P Ostrum  WJX:[id number] DOB: [birth year] DOA: 10/25/2018 PCP: Leeroy BockAnderson, Chelsey L, DO   Brief Narrative: Shelley Allison is a 63 y.o. female with a history of HTN and cerebral artery aneurysm rupture who presented to the ED 9/28 with about 1 week of N/V/D followed by progressive shortness of breath with associated fever starting 9/27. Symptoms started around the time of a large wedding she attended. She was found to be hypoxic requiring 6L O2 with positive SARS-CoV-2 testing, elevated inflammatory markers (CRP 20), and reticular CXR infiltrates, possibly left base infiltrate. [facility], started on steroids, and remdesivir 9/28, given tocilizumab on 9/29 when hypoxia worsened. D-dimer has risen from 2.93 to 6.72, so CTA chest is ordered. Due to progressive hypoxia and tachypnea, the patient was transferred to the ICU 9/30 but required no further excalation in care. Subsequently transferred back to PCU still on 15L O2.  Assessment & Plan: Principal Problem:   Respiratory tract infection due to COVID-19 virus Active Problems:   OBESITY, NOS   HYPERTENSION, BENIGN SYSTEMIC   CEREBRAL ANEURYSM, RUPTURED   Pneumonia due to COVID-19 virus   Acute respiratory failure with hypoxia (HCC)   Acute pulmonary embolus (HCC)   Essential hypertension   AKI (acute kidney injury) (HCC)   Morbid obesity with BMI of 70 and over, adult (HCC)   CAP (community acquired pneumonia)  Acute hypoxic respiratory failure due to covid-19 pneumonia: Remains tachypneic with significant hypoxia for several days, continues worsening.  - Continue remdesivir, steroids, s/p tocilizumab 9/29. Pt declined convalescent plasma and CRP now down from 20 to normal <0.8.  - Stopped antibiotics with undetectable procalcitonin and low clinical suspicion for bacterial PNA. - CTA chest performed 10/3. On my personal review, the contrast is well timed and no PE is noted. GGOs multifocal consistent with  covid without lobar consolidation to suggest bacterial PNA. Deescalate anticoagulation from treatment dose to 0.5mg /kg q12h due to continued d-dimer elevation. D/w pt who denies bleeding.  - Continue airborne, contact precautions. PPE including surgical gown, gloves, cap, shoe covers, and CAPR used during this encounter in a negative pressure room.  - Check daily labs: CBC w/diff, CMP, d-dimer, ferritin, CRP - Avoid NSAIDs - Recommend proning and aggressive use of incentive spirometry. Pt not comfortable with proning.  - Had brisk diuresis response to lasix (4L net out/24hrs). Appears euvolemic currently, will not repeat dose today. - Goals of care were discussed. Prognosis is guarded.   Resistant HTN: Remains elevated. - Continue increased dose norvasc - Started scheduled po hydralazine, increased to 75mg  dose due to consistently elevated BP. - Continue lisinopril 20mg .  - Restart home HCTZ  - Started metoprolol 50mg  BID - Continue prn hydralazine with attention to the history of cerebral artery aneurysm.   AKI: Resolved. Cr 1.43 initially, since improved to wnl. No evidence of contrast-induced nephropathy on today's labs. - Monitor BMP  Obesity: BMI 36. Negative impact on prognosis  History of ruptured cerebral artery aneurysm: Control BP  LFT elevation: ?remdesivir vs. viral effect without cholestatic findings.  - Continue to trend without further work up for the time being.  DVT prophylaxis: Lovenox Code Status: Full Family Communication: None at bedside Disposition Plan: Remain in PCU. Uncertain timing of discharge, dependent on clinical improvement. Still requiring 15L HFNC but comfortable-appearing.  Consultants:   PCCM  Procedures:   None  Antimicrobials:  Remdesivir 9/28 - 10/2  CTX 9/28 - 10/1  Azithromycin 9/28 - 9/29  Subjective: Short of breath with exertion, but not as severely as would be expected based on degree of hypoxia. Feels generally better,  denies shortness of breath at rest. Able to rest. +Cough, no chest pain or hemoptysis   Objective: Vitals:   11/19/18 0600 11/19/18 0643 11/19/18 0753 11/19/18 1000  BP:   (!) 161/86 (!) 154/71  Pulse: 80 84 84 96  Resp: (!) 24 (!) 27 (!) 30   Temp:   98.9 F (37.2 C)   TempSrc:   Oral   SpO2: 92% 98% 100%   Weight:      Height:        Intake/Output Summary (Last 24 hours) at 11/19/2018 1025 Last data filed at 11/19/2018 0511 Gross per 24 hour  Intake 500 ml  Output 1350 ml  Net -850 ml   Filed Weights   2018/12/10 1341 11/16/18 0259  Weight: 99.8 kg 98.9 kg   Gen: 63 y.o. female in no distress Pulm: Nonlabored breathing 15L. Crackles unchanged. No wheezing.. CV: Regular rate and rhythm. No murmur, rub, or gallop. No JVD, no dependent edema. GI: Abdomen soft, non-tender, non-distended, with normoactive bowel sounds.  Ext: Warm, no deformities Skin: No rashes, lesions or ulcers on visualized skin. Neuro: Alert and oriented. No focal neurological deficits. Psych: Judgement and insight appear fair. Mood euthymic & affect congruent. Behavior is appropriate.    Data Reviewed: I have personally reviewed following labs and imaging studies  CBC: Recent Labs  Lab 11/15/18 0453 11/16/18 0425 11/17/18 0135 11/18/18 0114 11/19/18 0320  WBC 11.5* 12.9* 12.4* 13.1* 14.7*  NEUTROABS 9.7* 10.7* 10.6* 11.7* 13.1*  HGB 11.9* 12.9 14.0 13.7 14.1  HCT 37.3 40.9 43.0 42.0 44.1  MCV 87.6 86.8 86.3 85.7 87.2  PLT 341 410* 390 316 161   Basic Metabolic Panel: Recent Labs  Lab 11/13/18 0255 11/14/18 0435 11/15/18 0453 11/16/18 0425 11/17/18 0135 11/18/18 0114 11/19/18 0320  NA 142 142 140 138 139 138 138  K 4.1 4.3 4.0 3.8 4.2 4.2 4.8  CL 109 113* 110 104 104 104 102  CO2 21* 22 22 23 26 25 23   GLUCOSE 140* 133* 140* 122* 138* 136* 105*  BUN 20 21 16 18 23  25* 27*  CREATININE 0.96 0.80 0.73 0.91 0.87 0.92 0.87  CALCIUM 8.3* 8.2* 8.0* 8.2* 8.5* 8.2* 8.3*  MG 2.3 2.4 2.3 2.2  2.2  --   --   PHOS 2.1* 2.4* 2.2* 2.2* 2.8 3.4  --    GFR: Estimated Creatinine Clearance: 77.1 mL/min (by C-G formula based on SCr of 0.87 mg/dL). Liver Function Tests: Recent Labs  Lab 11/15/18 0453 11/16/18 0425 11/17/18 0135 11/18/18 0114 11/19/18 0320  AST 38 41 50* 70* 67*  ALT 73* 70* 68* 85* 100*  ALKPHOS 63 78 82 82 85  BILITOT 0.5 0.8 1.0 0.7 0.6  PROT 6.6 6.8 6.7 6.4* 6.1*  ALBUMIN 2.6* 2.8* 2.9* 2.8* 2.7*   No results for input(s): LIPASE, AMYLASE in the last 168 hours. No results for input(s): AMMONIA in the last 168 hours. Coagulation Profile: No results for input(s): INR, PROTIME in the last 168 hours. Cardiac Enzymes: No results for input(s): CKTOTAL, CKMB, CKMBINDEX, TROPONINI in the last 168 hours. BNP (last 3 results) No results for input(s): PROBNP in the last 8760 hours. HbA1C: No results for input(s): HGBA1C in the last 72 hours. CBG: No results for input(s): GLUCAP in the last 168 hours. Lipid Profile: No results for input(s): CHOL, HDL, LDLCALC, TRIG, CHOLHDL,  LDLDIRECT in the last 72 hours. Thyroid Function Tests: No results for input(s): TSH, T4TOTAL, FREET4, T3FREE, THYROIDAB in the last 72 hours. Anemia Panel: No results for input(s): VITAMINB12, FOLATE, FERRITIN, TIBC, IRON, RETICCTPCT in the last 72 hours. Urine analysis:    Component Value Date/Time   COLORURINE YELLOW 11/15/2018 2215   APPEARANCEUR HAZY (A) 11/15/2018 2215   LABSPEC 1.018 11/15/2018 2215   PHURINE 6.0 11/15/2018 2215   GLUCOSEU NEGATIVE 11/15/2018 2215   HGBUR NEGATIVE 11/15/2018 2215   HGBUR trace-intact 11/14/2006 0829   BILIRUBINUR NEGATIVE 11/15/2018 2215   KETONESUR NEGATIVE 11/15/2018 2215   PROTEINUR NEGATIVE 11/15/2018 2215   UROBILINOGEN 1.0 04/06/2014 1816   NITRITE NEGATIVE 11/15/2018 2215   LEUKOCYTESUR NEGATIVE 11/15/2018 2215   Recent Results (from the past 240 hour(s))  SARS Coronavirus 2 [facility] order, Performed in [facility] hospital lab)  Nasopharyngeal Nasopharyngeal Swab     Status: Abnormal   Collection Time: 11/11/2018  1:43 PM   Specimen: Nasopharyngeal Swab  Result Value Ref Range Status   SARS Coronavirus 2 POSITIVE (A) NEGATIVE Final    Comment: RESULT CALLED TO, READ BACK BY AND VERIFIED WITH: Higinio Roger RN 15:15 11/11/2018 ([email or web address]) (NOTE) If result is NEGATIVE SARS-CoV-2 target nucleic acids are NOT DETECTED. The SARS-CoV-2 RNA is generally detectable in upper and lower  respiratory specimens during the acute phase of infection. The lowest  concentration of SARS-CoV-2 viral copies this assay can detect is 250  copies / mL. A negative result does not preclude SARS-CoV-2 infection  and should not be used as the sole basis for treatment or other  patient management decisions.  A negative result may occur with  improper specimen collection / handling, submission of specimen other  than nasopharyngeal swab, presence of viral mutation(s) within the  areas targeted by this assay, and inadequate number of viral copies  (<250 copies / mL). A negative result must be combined with clinical  observations, patient history, and epidemiological information. If result is POSITIVE SARS-CoV-2 target nucleic acids are DETECTED.  The SARS-CoV-2 RNA is generally detectable in upper and lower  respiratory specimens during the acute phase of infection.  Positive  results are indicative of active infection with SARS-CoV-2.  Clinical  correlation with patient history and other diagnostic information is  necessary to determine patient infection status.  Positive results do  not rule out bacterial infection or co-infection with other viruses. If result is PRESUMPTIVE POSTIVE SARS-CoV-2 nucleic acids MAY BE PRESENT.   A presumptive positive result was obtained on the submitted specimen  and confirmed on repeat testing.  While 2019 novel coronavirus  (SARS-CoV-2) nucleic acids may be present in the submitted sample  additional confirmatory  testing may be necessary for epidemiological  and / or clinical management purposes  to differentiate between  SARS-CoV-2 and other Sarbecovirus currently known to infect humans.  If clinically indicated additional testing with an alternate test  methodology [id number]) i s advised. The SARS-CoV-2 RNA is generally  detectable in upper and lower respiratory specimens during the acute  phase of infection. The expected result is Negative. Fact Sheet for Patients:  BoilerBrush.com.cy Fact Sheet for Healthcare Providers: https://pope.com/ This test is not yet approved or cleared by the [place] FDA and has been authorized for detection and/or diagnosis of SARS-CoV-2 by FDA under an Emergency Use Authorization (EUA).  This EUA will remain in effect (meaning this test can be used) for the duration of the COVID-19 declaration under Section 564(b)(1) of the Act,  21 U.S.C. section 360bbb-3(b)(1), unless the authorization is terminated or revoked sooner. Performed at [facility], [address], [place], [place] [address]       Radiology Studies: Ct Angio Chest Pe W Or Wo Contrast  Result Date: 11/17/2018 CLINICAL DATA:  Positive D-dimer. COVID-19 positive. Shortness of breath. Possible pulmonary embolism. EXAM: CT ANGIOGRAPHY CHEST WITH CONTRAST TECHNIQUE: Multidetector CT imaging of the chest was performed using the standard protocol during bolus administration of intravenous contrast. Multiplanar CT image reconstructions and MIPs were obtained to evaluate the vascular anatomy. CONTRAST:  49mL OMNIPAQUE IOHEXOL 350 MG/ML SOLN COMPARISON:  None. FINDINGS: Cardiovascular: Mild cardiomegaly. Thoracic aorta is within normal. Pulmonary arterial system is well opacified without evidence of emboli. Remaining vascular structures are unremarkable. Mediastinum/Nodes: 1.5 cm precarinal lymph node and 1.1 cm subcarinal lymph node likely reactive.  Remaining mediastinal structures are unremarkable. Lungs/Pleura: Lungs are adequately inflated demonstrate moderate bilateral patchy consolidative and ground-glass airspace process worse over the mid to upper lungs likely multifocal pneumonia. No evidence of effusion. Airways are normal. Upper Abdomen: No acute findings. Musculoskeletal: Mild degenerative change of the spine. Review of the MIP images confirms the above findings. IMPRESSION: 1.  No evidence of pulmonary embolism. 2. Moderate bilateral patchy multifocal airspace process likely multifocal pneumonia. Mild mediastinal reactive adenopathy. 3.  Mild cardiomegaly. Electronically Signed   By: Elberta Fortis M.D.   On: 11/17/2018 11:57    Scheduled Meds:  amLODipine  10 mg Oral Daily   Chlorhexidine Gluconate Cloth  6 each Topical Daily   enoxaparin (LOVENOX) injection  50 mg Subcutaneous Q12H   hydrALAZINE  75 mg Oral Q6H   hydrochlorothiazide  25 mg Oral Daily   lisinopril  20 mg Oral Daily   methylPREDNISolone (SOLU-MEDROL) injection  60 mg Intravenous Q12H   metoprolol tartrate  50 mg Oral BID   sodium chloride flush  3 mL Intravenous Q12H   Continuous Infusions:  sodium chloride Stopped (11/13/18 1159)     LOS: 7 days   Time spent: 35 minutes.  Tyrone Nine, MD [facility] [email or web address] Password [email or web address] 11/19/2018, 10:25 AM

## 2018-11-19 NOTE — Progress Notes (Signed)
Patient remains tachypenic, utilized NRB when transferring to [facility]. Patient desaturates to 70-80's with any exertion or coughing. Pt states she does not feel short of breath. Hypertension treated twice with PRN medications and resolved with each intervention. MD notified of hypertension with new PRN orders placed. Pt educated on self proning to help with oxygenation. Patient remains on HFNC @ 15L Will continue to montior and continue with POC

## 2018-11-20 LAB — CBC WITH DIFFERENTIAL/PLATELET
Abs Immature Granulocytes: 0.14 10*3/uL — ABNORMAL HIGH (ref 0.00–0.07)
Basophils Absolute: 0 10*3/uL (ref 0.0–0.1)
Basophils Relative: 0 %
Eosinophils Absolute: 0 10*3/uL (ref 0.0–0.5)
Eosinophils Relative: 0 %
HCT: 44.5 % (ref 36.0–46.0)
Hemoglobin: 14.1 g/dL (ref 12.0–15.0)
Immature Granulocytes: 1 %
Lymphocytes Relative: 6 %
Lymphs Abs: 0.9 10*3/uL (ref 0.7–4.0)
MCH: 27.4 pg (ref 26.0–34.0)
MCHC: 31.7 g/dL (ref 30.0–36.0)
MCV: 86.6 fL (ref 80.0–100.0)
Monocytes Absolute: 0.7 10*3/uL (ref 0.1–1.0)
Monocytes Relative: 5 %
Neutro Abs: 12.6 10*3/uL — ABNORMAL HIGH (ref 1.7–7.7)
Neutrophils Relative %: 88 %
Platelets: 330 10*3/uL (ref 150–400)
RBC: 5.14 MIL/uL — ABNORMAL HIGH (ref 3.87–5.11)
RDW: 14.1 % (ref 11.5–15.5)
WBC: 14.3 10*3/uL — ABNORMAL HIGH (ref 4.0–10.5)
nRBC: 0 % (ref 0.0–0.2)

## 2018-11-20 LAB — C-REACTIVE PROTEIN: CRP: <0.8 mg/dL (ref <1.0)

## 2018-11-20 LAB — COMPREHENSIVE METABOLIC PANEL
ALT: 88 U/L — ABNORMAL HIGH (ref 0–44)
AST: 44 U/L — ABNORMAL HIGH (ref 15–41)
Albumin: 2.8 g/dL — ABNORMAL LOW (ref 3.5–5.0)
Alkaline Phosphatase: 95 U/L (ref 38–126)
Anion gap: 17 — ABNORMAL HIGH (ref 5–15)
BUN: 25 mg/dL — ABNORMAL HIGH (ref 8–23)
CO2: 19 mmol/L — ABNORMAL LOW (ref 22–32)
Calcium: 8.2 mg/dL — ABNORMAL LOW (ref 8.9–10.3)
Chloride: 102 mmol/L (ref 98–111)
Creatinine, Ser: 0.84 mg/dL (ref 0.44–1.00)
GFR calc Af Amer: >60 mL/min (ref >60)
GFR calc non Af Amer: >60 mL/min (ref >60)
Glucose, Bld: 108 mg/dL — ABNORMAL HIGH (ref 70–99)
Potassium: 4.8 mmol/L (ref 3.5–5.1)
Sodium: 138 mmol/L (ref 135–145)
Total Bilirubin: 0.8 mg/dL (ref 0.3–1.2)
Total Protein: 6.3 g/dL — ABNORMAL LOW (ref 6.5–8.1)

## 2018-11-20 LAB — D-DIMER, QUANTITATIVE: D-Dimer, Quant: 8.53 ug/mL-FEU — ABNORMAL HIGH (ref 0.00–0.50)

## 2018-11-20 NOTE — Progress Notes (Addendum)
Called mother , no answer

## 2018-11-20 NOTE — TOC Progression Note (Signed)
Transition of Care [facility]) - Progression Note    Patient Details  Name: Shelley Allison MRN: [id number] Date of Birth: 06-28-1955  Transition of Care [facility]) CM/SW Contact  Loletha Grayer Beverely Pace, RN Phone Number: 11/20/2018, 1:49 PM  Clinical Narrative:   Patient remains in PCU, on [address] [place]. Medical workup continues, Case manager will continue to monitor for appropriate discharge needs. May she be blessed to recover.         Expected Discharge Plan and Services: to be determined.            Expected Discharge Date: 11/17/18                                     Social Determinants of Health (SDOH) Interventions    Readmission Risk Interventions No flowsheet data found.

## 2018-11-20 NOTE — Plan of Care (Signed)

## 2018-11-20 NOTE — Progress Notes (Signed)
PROGRESS NOTE  Shelley Allison  ZOX:[id number] DOB: 02/20/1955 DOA: 2018-12-24 PCP: Leeroy BockAnderson, Chelsey L, DO   Brief Narrative: Shelley Allison is a 63 y.o. female with a history of HTN and cerebral artery aneurysm rupture who presented to the ED 9/28 with about 1 week of N/V/D followed by progressive shortness of breath with associated fever starting 9/27. Symptoms started around the time of a large wedding she attended. She was found to be hypoxic requiring 6L O2 with positive SARS-CoV-2 testing, elevated inflammatory markers (CRP 20), and reticular CXR infiltrates, possibly left base infiltrate. [facility], started on steroids, and remdesivir 9/28, given tocilizumab on 9/29 when hypoxia worsened. D-dimer has risen from 2.93 to 6.72, so CTA chest is ordered. Due to progressive hypoxia and tachypnea, the patient was transferred to the ICU 9/30 but required no further excalation in care. Subsequently transferred back to PCU still on 15L O2.  Assessment & Plan: Principal Problem:   Respiratory tract infection due to COVID-19 virus Active Problems:   OBESITY, NOS   HYPERTENSION, BENIGN SYSTEMIC   CEREBRAL ANEURYSM, RUPTURED   Pneumonia due to COVID-19 virus   Acute respiratory failure with hypoxia (HCC)   Acute pulmonary embolus (HCC)   Essential hypertension   AKI (acute kidney injury) (HCC)   Morbid obesity with BMI of 70 and over, adult (HCC)   CAP (community acquired pneumonia)  Acute hypoxic respiratory failure due to covid-19 pneumonia: Remains tachypneic with significant hypoxia for several days. - Completed remdesivir 9/28 - 10/2. - Continue steroids - s/p tocilizumab 9/29. Pt declined convalescent plasma and CRP now down from 20 to normal <0.8.  - Stopped antibiotics with undetectable procalcitonin and low clinical suspicion for bacterial PNA. - CTA chest negative for PE performed 10/3. Lower extremity dopplers negative for DVT. With continued elevation of d-dimer, will  continue lovenox 0.5mg /kg q12h  - Continue airborne, contact precautions. PPE including surgical gown, gloves, cap, shoe covers, and CAPR used during this encounter in a negative pressure room.  - Avoid NSAIDs - Recommend proning and aggressive use of incentive spirometry. Pt not comfortable with proning.  - Had brisk diuresis response to lasix (4L net out/24hrs). Appears euvolemic currently, will not repeat dose today. - Goals of care were discussed. Prognosis is guarded.   Resistant HTN: Remains elevated but improving with augmentation of medications as below. - Continue increased dose norvasc - Started scheduled po hydralazine, increased to 75mg  dose due to consistently elevated BP. - Continue lisinopril 20mg .  - Restarted home HCTZ  - Started metoprolol 50mg  BID - Continue prn hydralazine with attention to the history of cerebral artery aneurysm.   AKI: Resolved. Cr 1.43 initially, since improved to wnl. No evidence of contrast-induced nephropathy - Monitor BMP  Obesity: BMI 36. Negative impact on prognosis  History of ruptured cerebral artery aneurysm: Control BP  LFT elevation: ?remdesivir vs. viral effect without cholestatic findings.  - Continue to trend, essentially stable, without further work up for the time being.  DVT prophylaxis: Lovenox Code Status: Full Family Communication: None at bedside Disposition Plan: Remain in PCU. Uncertain timing of discharge, dependent on clinical improvement. Still requiring 15L HFNC but comfortable-appearing.  Consultants:   PCCM  Procedures:   None  Antimicrobials:  Remdesivir 9/28 - 10/2  CTX 9/28 - 10/1  Azithromycin 9/28 - 9/29    Subjective: Feels fine at rest, though is hypoxic when O2 decreased. Does get severely short of breath with exertion. Eating well, urinating normally. Denies  leg swelling or chest pain.   Objective: Vitals:   11/20/18 0645 11/20/18 0755 11/20/18 0926 11/20/18 1137  BP: (!) 148/104 (!)  159/90 (!) 149/66 136/80  Pulse:  86  73  Resp:  (!) 26  (!) 22  Temp:  97.9 F (36.6 C)  98.6 F (37 C)  TempSrc:  Oral  Oral  SpO2:  91%  90%  Weight:      Height:        Intake/Output Summary (Last 24 hours) at 11/20/2018 1219 Last data filed at 11/20/2018 0900 Gross per 24 hour  Intake 490 ml  Output --  Net 490 ml   Filed Weights   12/03/18 1341 11/16/18 0259  Weight: 99.8 kg 98.9 kg   Gen: 63 y.o. female in no distress Pulm: Nonlabored breathing supplemental oxygen, 15LPM. Crackles bilaterally without wheezes. CV: Regular rate and rhythm. No murmur, rub, or gallop. No JVD, no LE dependent edema. GI: Abdomen soft, non-tender, non-distended, with normoactive bowel sounds.  Ext: Warm, no deformities Skin: No new rashes, lesions or ulcers on visualized skin. Neuro: Alert and oriented. No focal neurological deficits. Psych: Judgement and insight appear fair. Mood euthymic & affect congruent. Behavior is appropriate.    Data Reviewed: I have personally reviewed following labs and imaging studies  CBC: Recent Labs  Lab 11/16/18 0425 11/17/18 0135 11/18/18 0114 11/19/18 0320 11/20/18 0445  WBC 12.9* 12.4* 13.1* 14.7* 14.3*  NEUTROABS 10.7* 10.6* 11.7* 13.1* 12.6*  HGB 12.9 14.0 13.7 14.1 14.1  HCT 40.9 43.0 42.0 44.1 44.5  MCV 86.8 86.3 85.7 87.2 86.6  PLT 410* 390 316 293 330   Basic Metabolic Panel: Recent Labs  Lab 11/14/18 0435 11/15/18 0453 11/16/18 0425 11/17/18 0135 11/18/18 0114 11/19/18 0320 11/20/18 0445  NA 142 140 138 139 138 138 138  K 4.3 4.0 3.8 4.2 4.2 4.8 4.8  CL 113* 110 104 104 104 102 102  CO2 22 22 23 26 25 23  19*  GLUCOSE 133* 140* 122* 138* 136* 105* 108*  BUN 21 16 18 23  25* 27* 25*  CREATININE 0.80 0.73 0.91 0.87 0.92 0.87 0.84  CALCIUM 8.2* 8.0* 8.2* 8.5* 8.2* 8.3* 8.2*  MG 2.4 2.3 2.2 2.2  --   --   --   PHOS 2.4* 2.2* 2.2* 2.8 3.4  --   --    GFR: Estimated Creatinine Clearance: 79.9 mL/min (by C-G formula based on SCr of  0.84 mg/dL). Liver Function Tests: Recent Labs  Lab 11/16/18 0425 11/17/18 0135 11/18/18 0114 11/19/18 0320 11/20/18 0445  AST 41 50* 70* 67* 44*  ALT 70* 68* 85* 100* 88*  ALKPHOS 78 82 82 85 95  BILITOT 0.8 1.0 0.7 0.6 0.8  PROT 6.8 6.7 6.4* 6.1* 6.3*  ALBUMIN 2.8* 2.9* 2.8* 2.7* 2.8*   No results for input(s): LIPASE, AMYLASE in the last 168 hours. No results for input(s): AMMONIA in the last 168 hours. Coagulation Profile: No results for input(s): INR, PROTIME in the last 168 hours. Cardiac Enzymes: No results for input(s): CKTOTAL, CKMB, CKMBINDEX, TROPONINI in the last 168 hours. BNP (last 3 results) No results for input(s): PROBNP in the last 8760 hours. HbA1C: No results for input(s): HGBA1C in the last 72 hours. CBG: No results for input(s): GLUCAP in the last 168 hours. Lipid Profile: No results for input(s): CHOL, HDL, LDLCALC, TRIG, CHOLHDL, LDLDIRECT in the last 72 hours. Thyroid Function Tests: No results for input(s): TSH, T4TOTAL, FREET4, T3FREE, THYROIDAB in the last 72  hours. Anemia Panel: No results for input(s): VITAMINB12, FOLATE, FERRITIN, TIBC, IRON, RETICCTPCT in the last 72 hours. Urine analysis:    Component Value Date/Time   COLORURINE YELLOW 11/15/2018 2215   APPEARANCEUR HAZY (A) 11/15/2018 2215   LABSPEC 1.018 11/15/2018 2215   PHURINE 6.0 11/15/2018 2215   GLUCOSEU NEGATIVE 11/15/2018 2215   HGBUR NEGATIVE 11/15/2018 2215   HGBUR trace-intact 11/14/2006 0829   BILIRUBINUR NEGATIVE 11/15/2018 2215   KETONESUR NEGATIVE 11/15/2018 2215   PROTEINUR NEGATIVE 11/15/2018 2215   UROBILINOGEN 1.0 04/06/2014 1816   NITRITE NEGATIVE 11/15/2018 2215   LEUKOCYTESUR NEGATIVE 11/15/2018 2215   Recent Results (from the past 240 hour(s))  SARS Coronavirus 2 [facility] order, Performed in [facility] hospital lab) Nasopharyngeal Nasopharyngeal Swab     Status: Abnormal   Collection Time: 11/14/2018  1:43 PM   Specimen: Nasopharyngeal Swab  Result Value  Ref Range Status   SARS Coronavirus 2 POSITIVE (A) NEGATIVE Final    Comment: RESULT CALLED TO, READ BACK BY AND VERIFIED WITH: Ivin Poot RN 15:15 11-14-18 ([email or web address]) (NOTE) If result is NEGATIVE SARS-CoV-2 target nucleic acids are NOT DETECTED. The SARS-CoV-2 RNA is generally detectable in upper and lower  respiratory specimens during the acute phase of infection. The lowest  concentration of SARS-CoV-2 viral copies this assay can detect is 250  copies / mL. A negative result does not preclude SARS-CoV-2 infection  and should not be used as the sole basis for treatment or other  patient management decisions.  A negative result may occur with  improper specimen collection / handling, submission of specimen other  than nasopharyngeal swab, presence of viral mutation(s) within the  areas targeted by this assay, and inadequate number of viral copies  (<250 copies / mL). A negative result must be combined with clinical  observations, patient history, and epidemiological information. If result is POSITIVE SARS-CoV-2 target nucleic acids are DETECTED.  The SARS-CoV-2 RNA is generally detectable in upper and lower  respiratory specimens during the acute phase of infection.  Positive  results are indicative of active infection with SARS-CoV-2.  Clinical  correlation with patient history and other diagnostic information is  necessary to determine patient infection status.  Positive results do  not rule out bacterial infection or co-infection with other viruses. If result is PRESUMPTIVE POSTIVE SARS-CoV-2 nucleic acids MAY BE PRESENT.   A presumptive positive result was obtained on the submitted specimen  and confirmed on repeat testing.  While 2019 novel coronavirus  (SARS-CoV-2) nucleic acids may be present in the submitted sample  additional confirmatory testing may be necessary for epidemiological  and / or clinical management purposes  to differentiate between  SARS-CoV-2 and other  Sarbecovirus currently known to infect humans.  If clinically indicated additional testing with an alternate test  methodology [phone]) i s advised. The SARS-CoV-2 RNA is generally  detectable in upper and lower respiratory specimens during the acute  phase of infection. The expected result is Negative. Fact Sheet for Patients:  StrictlyIdeas.no Fact Sheet for Healthcare Providers: BankingDealers.co.za This test is not yet approved or cleared by the [place] FDA and has been authorized for detection and/or diagnosis of SARS-CoV-2 by FDA under an Emergency Use Authorization (EUA).  This EUA will remain in effect (meaning this test can be used) for the duration of the COVID-19 declaration under Section 564(b)(1) of the Act, 21 U.S.C. section 360bbb-3(b)(1), unless the authorization is terminated or revoked sooner. Performed at [facility], [place] [address]  [place], [place] [address]       Radiology Studies: No results found.  Scheduled Meds:  amLODipine  10 mg Oral Daily   Chlorhexidine Gluconate Cloth  6 each Topical Daily   enoxaparin (LOVENOX) injection  50 mg Subcutaneous Q12H   hydrALAZINE  75 mg Oral Q6H   hydrochlorothiazide  25 mg Oral Daily   lisinopril  20 mg Oral Daily   methylPREDNISolone (SOLU-MEDROL) injection  60 mg Intravenous Q12H   metoprolol tartrate  50 mg Oral BID   sodium chloride flush  3 mL Intravenous Q12H   Continuous Infusions:  sodium chloride Stopped (11/13/18 1159)     LOS: 8 days   Time spent: 35 minutes.  Tyrone Nine, MD [facility] [email or web address] Password [email or web address] 11/20/2018, 12:19 PM

## 2018-11-21 LAB — CBC WITH DIFFERENTIAL/PLATELET
Abs Immature Granulocytes: 0.1 10*3/uL — ABNORMAL HIGH (ref 0.00–0.07)
Basophils Absolute: 0 10*3/uL (ref 0.0–0.1)
Basophils Relative: 0 %
Eosinophils Absolute: 0 10*3/uL (ref 0.0–0.5)
Eosinophils Relative: 0 %
HCT: 44 % (ref 36.0–46.0)
Hemoglobin: 13.9 g/dL (ref 12.0–15.0)
Immature Granulocytes: 1 %
Lymphocytes Relative: 6 %
Lymphs Abs: 0.7 10*3/uL (ref 0.7–4.0)
MCH: 27.5 pg (ref 26.0–34.0)
MCHC: 31.6 g/dL (ref 30.0–36.0)
MCV: 87 fL (ref 80.0–100.0)
Monocytes Absolute: 0.6 10*3/uL (ref 0.1–1.0)
Monocytes Relative: 4 %
Neutro Abs: 11.8 10*3/uL — ABNORMAL HIGH (ref 1.7–7.7)
Neutrophils Relative %: 89 %
Platelets: 270 10*3/uL (ref 150–400)
RBC: 5.06 MIL/uL (ref 3.87–5.11)
RDW: 14.1 % (ref 11.5–15.5)
WBC: 13.2 10*3/uL — ABNORMAL HIGH (ref 4.0–10.5)
nRBC: 0 % (ref 0.0–0.2)

## 2018-11-21 LAB — COMPREHENSIVE METABOLIC PANEL
ALT: 68 U/L — ABNORMAL HIGH (ref 0–44)
AST: 27 U/L (ref 15–41)
Albumin: 2.9 g/dL — ABNORMAL LOW (ref 3.5–5.0)
Alkaline Phosphatase: 90 U/L (ref 38–126)
Anion gap: 13 (ref 5–15)
BUN: 28 mg/dL — ABNORMAL HIGH (ref 8–23)
CO2: 23 mmol/L (ref 22–32)
Calcium: 8.4 mg/dL — ABNORMAL LOW (ref 8.9–10.3)
Chloride: 100 mmol/L (ref 98–111)
Creatinine, Ser: 0.9 mg/dL (ref 0.44–1.00)
GFR calc Af Amer: >60 mL/min (ref >60)
GFR calc non Af Amer: >60 mL/min (ref >60)
Glucose, Bld: 124 mg/dL — ABNORMAL HIGH (ref 70–99)
Potassium: 4.7 mmol/L (ref 3.5–5.1)
Sodium: 136 mmol/L (ref 135–145)
Total Bilirubin: 1.1 mg/dL (ref 0.3–1.2)
Total Protein: 6.3 g/dL — ABNORMAL LOW (ref 6.5–8.1)

## 2018-11-21 LAB — D-DIMER, QUANTITATIVE: D-Dimer, Quant: 7.13 ug/mL-FEU — ABNORMAL HIGH (ref 0.00–0.50)

## 2018-11-21 LAB — C-REACTIVE PROTEIN: CRP: <0.8 mg/dL (ref <1.0)

## 2018-11-21 MED ORDER — FUROSEMIDE 10 MG/ML IJ SOLN
40.0000 mg | Freq: Two times a day (BID) | INTRAMUSCULAR | Status: DC
  Administered 2018-11-21 – 2018-11-22 (×2): 40 mg via INTRAVENOUS
  Filled 2018-11-21 (×2): qty 4

## 2018-11-21 MED ORDER — FUROSEMIDE 10 MG/ML IJ SOLN
40.0000 mg | Freq: Once | INTRAMUSCULAR | Status: AC
  Administered 2018-11-21: 12:00:00 40 mg via INTRAVENOUS
  Filled 2018-11-21: qty 4

## 2018-11-21 NOTE — Progress Notes (Signed)
[place] TEAM 1 - Stepdown/ICU TEAM  Shelley Allison  ZOX:[id number] DOB: 1955/12/29 DOA: 11/01/2018 PCP: Leeroy BockAnderson, Chelsey L, DO    Brief Narrative:  [phone] with a history of HTN and cerebral artery aneurysm rupture who presented to the ED 9/28 with 1 week of nausea vomiting and diarrhea followed by progressive shortness of breath with fever which began on 9/27.  She had attended a large wedding prior to the onset of her symptoms.  In the ED she was found to be hypoxic and confirmed to be SARS-CoV-2 positive.  Chest x-ray revealed bilateral infiltrates.  Significant Events: 9/21 approximate onset of GI symptoms 9/27 onset of pulmonary symptoms 9/28 admit  9/30 transfer to ICU  COVID-19 specific Treatment: Remdesivir 9/28 >10/2 Solumedrol 9/28 > Actemra 9/29 Convalescent plasma - patient declined  Subjective: Continues to require high flow nasal cannula support at 14 L/min.  Appears very comfortable on exam.  Is anxious to be discharged from the hospital.  Denies chest pain nausea vomiting or abdominal pain.  Assessment & Plan:  COVID pneumonia - acute hypoxic respiratory failure CT angio chest negative for PE 10/3 -bilateral lower extremity venous duplex negative for DVT -continues to require high level oxygen support -attempt diuresis to assist -wean oxygen as able -ambulate -incentive spirometer -has completed Solu-Medrol Remdesivir and Actemra treatment  Recent Labs  Lab 11/15/18 0453 11/16/18 0425 11/17/18 0135 11/18/18 0114 11/19/18 0320 11/20/18 0445 11/21/18 0515  DDIMER 17.18* 9.88* 8.93* 10.19* 10.29* 8.53* 7.13*  FERRITIN 489* 518*  --   --   --   --   --   CRP 4.5* 2.8* 1.6* 1.0* <0.8 <0.8 <0.8  ALT 73* 70* 68* 85* 100* 88* 68*  PROCALCITON  --  <0.10 0.67  --   --   --   --     Resistant HTN Blood pressure trending downward -follow without change in treatment today  Acute kidney injury Creatinine peaked at 1.43 -resolved  Recent Labs  Lab 11/17/18 0135  11/18/18 0114 11/19/18 0320 11/20/18 0445 11/21/18 0515  CREATININE 0.87 0.92 0.87 0.84 0.90    Morbid obesity - Estimated body mass index is 35.18 kg/m as calculated from the following:   Height as of this encounter: 5\' 5"  (1.651 m).   Weight as of this encounter: 95.9 kg.   Transaminitis Nearly resolved at this time -likely due to a virus itself or Remdesivir  Recent Labs  Lab 11/17/18 0135 11/18/18 0114 11/19/18 0320 11/20/18 0445 11/21/18 0515  AST 50* 70* 67* 44* 27  ALT 68* 85* 100* 88* 68*  ALKPHOS 82 82 85 95 90  BILITOT 1.0 0.7 0.6 0.8 1.1  PROT 6.7 6.4* 6.1* 6.3* 6.3*  ALBUMIN 2.9* 2.8* 2.7* 2.8* 2.9*    History of ruptured cerebral artery aneurysm   DVT prophylaxis: Lovenox Code Status: FULL CODE Family Communication:  Disposition Plan:   Consultants:  PCCM  Antimicrobials:  Ceftriaxone 9/28 > 10/1  Azithromycin 9/28 > 9/29  Objective: Blood pressure (!) 155/87, pulse 69, temperature 98.8 F (37.1 C), temperature source Oral, resp. rate 20, height 5\' 5"  (1.651 m), weight 95.9 kg, SpO2 94 %.  Intake/Output Summary (Last 24 hours) at 11/21/2018 0800 Last data filed at 11/21/2018 0500 Gross per 24 hour  Intake 1170 ml  Output 500 ml  Net 670 ml   Filed Weights   10/24/2018 1341 11/16/18 0259 11/21/18 0600  Weight: 99.8 kg 98.9 kg 95.9 kg    Examination: General: No acute respiratory distress  Lungs: Fine diffuse crackles all fields Cardiovascular: Regular rate and rhythm without murmur gallop or rub normal S1 and S2 Abdomen: Nontender, nondistended, soft, bowel sounds positive, no rebound, no ascites, no appreciable mass Extremities: No significant cyanosis, clubbing, or edema bilateral lower extremities  CBC: Recent Labs  Lab 11/19/18 0320 11/20/18 0445 11/21/18 0515  WBC 14.7* 14.3* 13.2*  NEUTROABS 13.1* 12.6* 11.8*  HGB 14.1 14.1 13.9  HCT 44.1 44.5 44.0  MCV 87.2 86.6 87.0  PLT 293 330 709   Basic Metabolic Panel: Recent Labs   Lab 11/15/18 0453 11/16/18 0425 11/17/18 0135 11/18/18 0114 11/19/18 0320 11/20/18 0445 11/21/18 0515  NA 140 138 139 138 138 138 136  K 4.0 3.8 4.2 4.2 4.8 4.8 4.7  CL 110 104 104 104 102 102 100  CO2 22 23 26 25 23  19* 23  GLUCOSE 140* 122* 138* 136* 105* 108* 124*  BUN 16 18 23  25* 27* 25* 28*  CREATININE 0.73 0.91 0.87 0.92 0.87 0.84 0.90  CALCIUM 8.0* 8.2* 8.5* 8.2* 8.3* 8.2* 8.4*  MG 2.3 2.2 2.2  --   --   --   --   PHOS 2.2* 2.2* 2.8 3.4  --   --   --    GFR: Estimated Creatinine Clearance: 73.3 mL/min (by C-G formula based on SCr of 0.9 mg/dL).  Liver Function Tests: Recent Labs  Lab 11/18/18 0114 11/19/18 0320 11/20/18 0445 11/21/18 0515  AST 70* 67* 44* 27  ALT 85* 100* 88* 68*  ALKPHOS 82 85 95 90  BILITOT 0.7 0.6 0.8 1.1  PROT 6.4* 6.1* 6.3* 6.3*  ALBUMIN 2.8* 2.7* 2.8* 2.9*    HbA1C: Hemoglobin A1C  Date/Time Value Ref Range Status  05/15/2015 03:52 PM 5.5  Final  04/25/2014 04:43 PM 5.8  Final    Recent Results (from the past 240 hour(s))  SARS Coronavirus 2 [facility] order, Performed in [facility] hospital lab) Nasopharyngeal Nasopharyngeal Swab     Status: Abnormal   Collection Time: 11/13/2018  1:43 PM   Specimen: Nasopharyngeal Swab  Result Value Ref Range Status   SARS Coronavirus 2 POSITIVE (A) NEGATIVE Final    Comment: RESULT CALLED TO, READ BACK BY AND VERIFIED WITH: Ivin Poot RN 15:15 10/26/2018 ([email or web address]) (NOTE) If result is NEGATIVE SARS-CoV-2 target nucleic acids are NOT DETECTED. The SARS-CoV-2 RNA is generally detectable in upper and lower  respiratory specimens during the acute phase of infection. The lowest  concentration of SARS-CoV-2 viral copies this assay can detect is 250  copies / mL. A negative result does not preclude SARS-CoV-2 infection  and should not be used as the sole basis for treatment or other  patient management decisions.  A negative result may occur with  improper specimen collection / handling, submission of  specimen other  than nasopharyngeal swab, presence of viral mutation(s) within the  areas targeted by this assay, and inadequate number of viral copies  (<250 copies / mL). A negative result must be combined with clinical  observations, patient history, and epidemiological information. If result is POSITIVE SARS-CoV-2 target nucleic acids are DETECTED.  The SARS-CoV-2 RNA is generally detectable in upper and lower  respiratory specimens during the acute phase of infection.  Positive  results are indicative of active infection with SARS-CoV-2.  Clinical  correlation with patient history and other diagnostic information is  necessary to determine patient infection status.  Positive results do  not rule out bacterial infection or co-infection with other viruses. If result is  PRESUMPTIVE POSTIVE SARS-CoV-2 nucleic acids MAY BE PRESENT.   A presumptive positive result was obtained on the submitted specimen  and confirmed on repeat testing.  While 2019 novel coronavirus  (SARS-CoV-2) nucleic acids may be present in the submitted sample  additional confirmatory testing may be necessary for epidemiological  and / or clinical management purposes  to differentiate between  SARS-CoV-2 and other Sarbecovirus currently known to infect humans.  If clinically indicated additional testing with an alternate test  methodology [phone]) i s advised. The SARS-CoV-2 RNA is generally  detectable in upper and lower respiratory specimens during the acute  phase of infection. The expected result is Negative. Fact Sheet for Patients:  BoilerBrush.com.cy Fact Sheet for Healthcare Providers: https://pope.com/ This test is not yet approved or cleared by the [place] FDA and has been authorized for detection and/or diagnosis of SARS-CoV-2 by FDA under an Emergency Use Authorization (EUA).  This EUA will remain in effect (meaning this test can be used) for the  duration of the COVID-19 declaration under Section 564(b)(1) of the Act, 21 U.S.C. section 360bbb-3(b)(1), unless the authorization is terminated or revoked sooner. Performed at [facility], [address], [place], [place] [address]      Scheduled Meds: . amLODipine  10 mg Oral Daily  . Chlorhexidine Gluconate Cloth  6 each Topical Daily  . enoxaparin (LOVENOX) injection  50 mg Subcutaneous Q12H  . hydrALAZINE  75 mg Oral Q6H  . hydrochlorothiazide  25 mg Oral Daily  . lisinopril  20 mg Oral Daily  . methylPREDNISolone (SOLU-MEDROL) injection  60 mg Intravenous Q12H  . metoprolol tartrate  50 mg Oral BID  . sodium chloride flush  3 mL Intravenous Q12H     LOS: 9 days   Lonia Blood, MD [facility] Office  [phone] Pager - Text Page per Loretha Stapler  If 7PM-7AM, please contact night-coverage per Amion 11/21/2018, 8:00 AM

## 2018-11-21 NOTE — Progress Notes (Signed)
Declined to have RN call to update family when asked at 2100. Pt expressed concern for her mother, who is also sick with covid. Emotional support provided

## 2018-11-21 NOTE — Evaluation (Signed)
Physical Therapy Evaluation Patient Details Name: Shelley Allison MRN: [id number] DOB: [birth year] Today's Date: 11/21/2018   History of Present Illness  63 y/o female pt w/ hx of HTN, obesity, cerebral artery aneurysm rupture presented to ED  09/28 w/ N/V/D w/ progressive SOB and fever starting 09/27. precipitating factor was attending a wedding. In Ed dx w/ SARS-CoV-2 and moved to ICU 09/30.  Clinical Impression   Pt admitted with above diagnosis. Pt currently with functional limitations due to the deficits listed below (see PT Problem List). Pt does well with mobility being able to complete most with SBA and cues, she limited only by 02 saturation and fatigue needing increased time to recover. Pt was on 15L/min via HFNC and dd desat to low 80s, she was monitored via finger probe. Pt will benefit from skilled PT to increase her activity tolerance, independence and safety with mobility to allow discharge to the venue listed below.       Follow Up Recommendations No PT follow up(once d/c should be able to go home w/ family)    Equipment Recommendations       Recommendations for Other Services       Precautions / Restrictions Precautions Precautions: Fall;Other (comment)(desats w/ activity) Restrictions Weight Bearing Restrictions: No      Mobility  Bed Mobility               General bed mobility comments: pt recieved sitting in recliner  Transfers Overall transfer level: Needs assistance Equipment used: None Transfers: Sit to/from Stand;Stand Pivot Transfers Sit to Stand: Supervision Stand pivot transfers: Supervision       General transfer comment: needs lines management   Ambulation/Gait Ambulation/Gait assistance: Supervision Gait Distance (Feet): 48 Feet Assistive device: None Gait Pattern/deviations: Step-through pattern     General Gait Details: 48 ft x 2 w/ seated rest break to complete, desat to low 80s on 15L/min via HFNC measured on pedi finger  probe  Stairs            Wheelchair Mobility    Modified Rankin (Stroke Patients Only)       Balance Overall balance assessment: Modified Independent                                           Pertinent Vitals/Pain Pain Assessment: No/denies pain    Home Living Family/patient expects to be discharged to:: Private residence Living Arrangements: Children;Parent   Type of Home: Other(Comment)(town home) Home Access: Level entry     Home Layout: Two level Home Equipment: None(mother has some ) Additional Comments: pt lives home with mother, mother has assists with ADLs etc, pt states she only takes care of mother as far as companionship    Prior Function Level of Independence: Independent               Hand Dominance        Extremity/Trunk Assessment   Upper Extremity Assessment Upper Extremity Assessment: Overall WFL for tasks assessed    Lower Extremity Assessment Lower Extremity Assessment: Overall WFL for tasks assessed       Communication   Communication: No difficulties  Cognition Arousal/Alertness: Awake/alert Behavior During Therapy: WFL for tasks assessed/performed Overall Cognitive Status: Within Functional Limits for tasks assessed  General Comments General comments (skin integrity, edema, etc.): Pt does well functionally needing SBA to mod I with line management, on 15L/min via HFNC, takes approx 2-3 mins to recover but fluctuates between high 80s and low 90s afterwards    Exercises Other Exercises Other Exercises: reinforced use of IS and flutter valve   Assessment/Plan    PT Assessment Patient needs continued PT services(while in hospital to address activity tolerance)  PT Problem List Decreased strength;Decreased activity tolerance;Decreased coordination;Decreased safety awareness       PT Treatment Interventions Gait training;Stair training;Functional  mobility training;Therapeutic activities;Therapeutic exercise;Neuromuscular re-education;Patient/family education    PT Goals (Current goals can be found in the Care Plan section)  Acute Rehab PT Goals Patient Stated Goal: go home PT Goal Formulation: With patient Time For Goal Achievement: 12/05/18 Potential to Achieve Goals: Good    Frequency Min 3X/week   Barriers to discharge        Co-evaluation               AM-PAC PT "6 Clicks" Mobility  Outcome Measure Help needed turning from your back to your side while in a flat bed without using bedrails?: None Help needed moving from lying on your back to sitting on the side of a flat bed without using bedrails?: None Help needed moving to and from a bed to a chair (including a wheelchair)?: None Help needed standing up from a chair using your arms (e.g., wheelchair or bedside chair)?: None Help needed to walk in hospital room?: A Little Help needed climbing 3-5 steps with a railing? : A Little 6 Click Score: 22    End of Session Equipment Utilized During Treatment: Oxygen Activity Tolerance: Treatment limited secondary to medical complications (Comment) Patient left: in chair;with call bell/phone within reach   PT Visit Diagnosis: Other abnormalities of gait and mobility (R26.89);Muscle weakness (generalized) (M62.81)    Time: 1340-1405 PT Time Calculation (min) (ACUTE ONLY): 25 min   Charges:   PT Evaluation $PT Eval Low Complexity: 1 Low PT Treatments $Gait Training: 8-22 mins        Horald Chestnut, PT   Delford Field 11/21/2018, 4:47 PM

## 2018-11-21 NOTE — Plan of Care (Signed)

## 2018-11-22 ENCOUNTER — Inpatient Hospital Stay (HOSPITAL_COMMUNITY): Payer: BC Managed Care – PPO

## 2018-11-22 LAB — COMPREHENSIVE METABOLIC PANEL
ALT: 56 U/L — ABNORMAL HIGH (ref 0–44)
AST: 21 U/L (ref 15–41)
Albumin: 3 g/dL — ABNORMAL LOW (ref 3.5–5.0)
Alkaline Phosphatase: 82 U/L (ref 38–126)
Anion gap: 14 (ref 5–15)
BUN: 38 mg/dL — ABNORMAL HIGH (ref 8–23)
CO2: 25 mmol/L (ref 22–32)
Calcium: 8.5 mg/dL — ABNORMAL LOW (ref 8.9–10.3)
Chloride: 98 mmol/L (ref 98–111)
Creatinine, Ser: 1.08 mg/dL — ABNORMAL HIGH (ref 0.44–1.00)
GFR calc Af Amer: >60 mL/min (ref >60)
GFR calc non Af Amer: 55 mL/min — ABNORMAL LOW (ref >60)
Glucose, Bld: 126 mg/dL — ABNORMAL HIGH (ref 70–99)
Potassium: 4.5 mmol/L (ref 3.5–5.1)
Sodium: 137 mmol/L (ref 135–145)
Total Bilirubin: 1.1 mg/dL (ref 0.3–1.2)
Total Protein: 6.2 g/dL — ABNORMAL LOW (ref 6.5–8.1)

## 2018-11-22 LAB — CBC
HCT: 44.5 % (ref 36.0–46.0)
Hemoglobin: 14 g/dL (ref 12.0–15.0)
MCH: 27.3 pg (ref 26.0–34.0)
MCHC: 31.5 g/dL (ref 30.0–36.0)
MCV: 86.7 fL (ref 80.0–100.0)
Platelets: 257 10*3/uL (ref 150–400)
RBC: 5.13 MIL/uL — ABNORMAL HIGH (ref 3.87–5.11)
RDW: 14 % (ref 11.5–15.5)
WBC: 9.9 10*3/uL (ref 4.0–10.5)
nRBC: 0 % (ref 0.0–0.2)

## 2018-11-22 LAB — FERRITIN: Ferritin: 513 ng/mL — ABNORMAL HIGH (ref 11–307)

## 2018-11-22 LAB — C-REACTIVE PROTEIN: CRP: <0.8 mg/dL (ref <1.0)

## 2018-11-22 LAB — D-DIMER, QUANTITATIVE: D-Dimer, Quant: 3.66 ug/mL-FEU — ABNORMAL HIGH (ref 0.00–0.50)

## 2018-11-22 MED ORDER — ENOXAPARIN SODIUM 60 MG/0.6ML ~~LOC~~ SOLN
50.0000 mg | SUBCUTANEOUS | Status: DC
  Administered 2018-11-23 – 2018-12-02 (×10): 50 mg via SUBCUTANEOUS
  Filled 2018-11-22 (×10): qty 0.6

## 2018-11-22 MED ORDER — FUROSEMIDE 10 MG/ML IJ SOLN
40.0000 mg | Freq: Two times a day (BID) | INTRAMUSCULAR | Status: DC
  Administered 2018-11-22: 17:00:00 40 mg via INTRAVENOUS
  Filled 2018-11-22 (×2): qty 4

## 2018-11-22 MED ORDER — LISINOPRIL 10 MG PO TABS
10.0000 mg | ORAL_TABLET | Freq: Every day | ORAL | Status: DC
  Administered 2018-11-23 – 2018-11-27 (×5): 10 mg via ORAL
  Filled 2018-11-22 (×7): qty 1

## 2018-11-22 MED ORDER — HYDRALAZINE HCL 50 MG PO TABS: 25.0000 mg | ORAL_TABLET | Freq: Four times a day (QID) | ORAL | Status: DC

## 2018-11-22 NOTE — Progress Notes (Signed)
[place] TEAM 1 - Stepdown/ICU TEAM  Shelley Allison  YQM:[id number] DOB: 07-Oct-1955 DOA: 12-04-2018 PCP: Richarda Osmond, DO    Brief Narrative:  [phone] with a history of HTN and cerebral artery aneurysm rupture who presented to the ED 9/28 with 1 week of nausea vomiting and diarrhea followed by progressive shortness of breath with fever which began on 9/27.  She had attended a large wedding prior to the onset of her symptoms.  In the ED she was found to be hypoxic and confirmed to be SARS-CoV-2 positive.  Chest x-ray revealed bilateral infiltrates.  Significant Events: 9/21 approximate onset of GI symptoms 9/27 onset of pulmonary symptoms 9/28 admit  9/30 transfer to ICU  COVID-19 specific Treatment: Remdesivir 9/28 >10/2 Solumedrol 9/28 > 10/7 Actemra 9/29 Convalescent plasma - patient declined  Subjective: Oxygen support has been weaned to 12 L high flow nasal cannula with sats remaining in the low 90s to upper 80s.  The patient is now net negative approximately 1200 cc since admission.  She is very anxious to go home.  She denies chest pain nausea vomiting abdominal pain or shortness of breath.  Assessment & Plan:  COVID pneumonia - acute hypoxic respiratory failure CT angio chest negative for PE 10/3 -bilateral lower extremity venous duplex negative for DVT -continues to require high level oxygen support -continue diuresis -wean oxygen as able -ambulate -incentive spirometer -has completed Solu-Medrol Remdesivir and Actemra treatment -currently requiring too much oxygen support to allow discharge home  Recent Labs  Lab 11/16/18 0425 11/17/18 0135 11/18/18 0114 11/19/18 0320 11/20/18 0445 11/21/18 0515 11/22/18 0254  DDIMER 9.88* 8.93* 10.19* 10.29* 8.53* 7.13* 3.66*  FERRITIN 518*  --   --   --   --   --  513*  CRP 2.8* 1.6* 1.0* <0.8 <0.8 <0.8 <0.8  ALT 70* 68* 85* 100* 88* 68* 56*  PROCALCITON <0.10 0.67  --   --   --   --   --     Resistant HTN Blood pressure  is better controlled today  Acute kidney injury Creatinine peaked at 1.43 - resolved -continue to follow creatinine with attempts to diurese  Recent Labs  Lab 11/18/18 0114 11/19/18 0320 11/20/18 0445 11/21/18 0515 11/22/18 0254  CREATININE 0.92 0.87 0.84 0.90 1.08*    Morbid obesity - Estimated body mass index is 34.52 kg/m as calculated from the following:   Height as of this encounter: 5\' 5"  (1.651 m).   Weight as of this encounter: 94.1 kg.   Transaminitis Nearly resolved at this time -likely due to a virus itself or Remdesivir  Recent Labs  Lab 11/18/18 0114 11/19/18 0320 11/20/18 0445 11/21/18 0515 11/22/18 0254  AST 70* 67* 44* 27 21  ALT 85* 100* 88* 68* 56*  ALKPHOS 82 85 95 90 82  BILITOT 0.7 0.6 0.8 1.1 1.1  PROT 6.4* 6.1* 6.3* 6.3* 6.2*  ALBUMIN 2.8* 2.7* 2.8* 2.9* 3.0*    History of ruptured cerebral artery aneurysm   DVT prophylaxis: Lovenox Code Status: FULL CODE Family Communication:  Disposition Plan: Discharge home when oxygen requirement allows this  Consultants:  PCCM  Antimicrobials:  Ceftriaxone 9/28 > 10/1  Azithromycin 9/28 > 9/29  Objective: Blood pressure 127/77, pulse 72, temperature 98.1 F (36.7 C), temperature source Oral, resp. rate (!) 25, height 5\' 5"  (1.651 m), weight 94.1 kg, SpO2 95 %.  Intake/Output Summary (Last 24 hours) at 11/22/2018 0756 Last data filed at 11/22/2018 0600 Gross per 24 hour  Intake 200 ml  Output 1400 ml  Net -1200 ml   Filed Weights   11/16/18 0259 11/21/18 0600 11/22/18 0600  Weight: 98.9 kg 95.9 kg 94.1 kg    Examination: General: No acute respiratory distress Lungs: Fine crackles throughout with no wheezing Cardiovascular: RRR without murmurs Abdomen: NT/ND, soft, BS positive, no rebound Extremities: No C/C/E bilateral lower extremities  CBC: Recent Labs  Lab 11/19/18 0320 11/20/18 0445 11/21/18 0515 11/22/18 0254  WBC 14.7* 14.3* 13.2* 9.9  NEUTROABS 13.1* 12.6* 11.8*  --    HGB 14.1 14.1 13.9 14.0  HCT 44.1 44.5 44.0 44.5  MCV 87.2 86.6 87.0 86.7  PLT 293 330 270 257   Basic Metabolic Panel: Recent Labs  Lab 11/16/18 0425 11/17/18 0135 11/18/18 0114  11/20/18 0445 11/21/18 0515 11/22/18 0254  NA 138 139 138   < > 138 136 137  K 3.8 4.2 4.2   < > 4.8 4.7 4.5  CL 104 104 104   < > 102 100 98  CO2 23 26 25    < > 19* 23 25  GLUCOSE 122* 138* 136*   < > 108* 124* 126*  BUN 18 23 25*   < > 25* 28* 38*  CREATININE 0.91 0.87 0.92   < > 0.84 0.90 1.08*  CALCIUM 8.2* 8.5* 8.2*   < > 8.2* 8.4* 8.5*  MG 2.2 2.2  --   --   --   --   --   PHOS 2.2* 2.8 3.4  --   --   --   --    < > = values in this interval not displayed.   GFR: Estimated Creatinine Clearance: 60.4 mL/min (A) (by C-G formula based on SCr of 1.08 mg/dL (H)).  Liver Function Tests: Recent Labs  Lab 11/19/18 0320 11/20/18 0445 11/21/18 0515 11/22/18 0254  AST 67* 44* 27 21  ALT 100* 88* 68* 56*  ALKPHOS 85 95 90 82  BILITOT 0.6 0.8 1.1 1.1  PROT 6.1* 6.3* 6.3* 6.2*  ALBUMIN 2.7* 2.8* 2.9* 3.0*    HbA1C: Hemoglobin A1C  Date/Time Value Ref Range Status  05/15/2015 03:52 PM 5.5  Final  04/25/2014 04:43 PM 5.8  Final    Recent Results (from the past 240 hour(s))  SARS Coronavirus 2 [facility](Hospital order, [facility]) Nasopharyngeal Nasopharyngeal Swab     Status: Abnormal   Collection Time: 10/30/2018  1:43 PM   Specimen: Nasopharyngeal Swab  Result Value Ref Range Status   SARS Coronavirus 2 POSITIVE (A) NEGATIVE Final    Comment: RESULT CALLED TO, READ BACK BY AND VERIFIED WITH: Higinio RogerM. Barber RN 15:15 11/08/2018 ([email or web address]) (NOTE) If result is NEGATIVE SARS-CoV-2 target nucleic acids are NOT DETECTED. The SARS-CoV-2 RNA is generally detectable in upper and lower  respiratory specimens during the acute phase of infection. The lowest  concentration of SARS-CoV-2 viral copies this assay can detect is 250  copies / mL. A negative result does not preclude SARS-CoV-2  infection  and should not be used as the sole basis for treatment or other  patient management decisions.  A negative result may occur with  improper specimen collection / handling, submission of specimen other  than nasopharyngeal swab, presence of viral mutation(s) within the  areas targeted by this assay, and inadequate number of viral copies  (<250 copies / mL). A negative result must be combined with clinical  observations, patient history, and epidemiological information. If result is POSITIVE SARS-CoV-2 target nucleic acids  are DETECTED.  The SARS-CoV-2 RNA is generally detectable in upper and lower  respiratory specimens during the acute phase of infection.  Positive  results are indicative of active infection with SARS-CoV-2.  Clinical  correlation with patient history and other diagnostic information is  necessary to determine patient infection status.  Positive results do  not rule out bacterial infection or co-infection with other viruses. If result is PRESUMPTIVE POSTIVE SARS-CoV-2 nucleic acids MAY BE PRESENT.   A presumptive positive result was obtained on the submitted specimen  and confirmed on repeat testing.  While 2019 novel coronavirus  (SARS-CoV-2) nucleic acids may be present in the submitted sample  additional confirmatory testing may be necessary for epidemiological  and / or clinical management purposes  to differentiate between  SARS-CoV-2 and other Sarbecovirus currently known to infect humans.  If clinically indicated additional testing with an alternate test  methodology [id number]) i s advised. The SARS-CoV-2 RNA is generally  detectable in upper and lower respiratory specimens during the acute  phase of infection. The expected result is Negative. Fact Sheet for Patients:  BoilerBrush.com.cy Fact Sheet for Healthcare Providers: https://pope.com/ This test is not yet approved or cleared by the [place]  FDA and has been authorized for detection and/or diagnosis of SARS-CoV-2 by FDA under an Emergency Use Authorization (EUA).  This EUA will remain in effect (meaning this test can be used) for the duration of the COVID-19 declaration under Section 564(b)(1) of the Act, 21 U.S.C. section 360bbb-3(b)(1), unless the authorization is terminated or revoked sooner. Performed at [facility], [address], [place], [place] [address]      Scheduled Meds: . amLODipine  10 mg Oral Daily  . Chlorhexidine Gluconate Cloth  6 each Topical Daily  . enoxaparin (LOVENOX) injection  50 mg Subcutaneous Q12H  . furosemide  40 mg Intravenous BID  . hydrALAZINE  75 mg Oral Q6H  . lisinopril  20 mg Oral Daily  . metoprolol tartrate  50 mg Oral BID  . sodium chloride flush  3 mL Intravenous Q12H     LOS: 10 days   Lonia Blood, MD [facility] Office  [phone] Pager - Text Page per Loretha Stapler  If 7PM-7AM, please contact night-coverage per Amion 11/22/2018, 7:56 AM

## 2018-11-22 NOTE — Progress Notes (Signed)
Patient's daughter updated by telephone.  All questions welcomed and answered.

## 2018-11-22 NOTE — Progress Notes (Signed)
D-dimer has decreased to <5. Ok to decrease Lovenox to 0.5mg /kg/day per Dr Thereasa Solo.  CrCl>30, CBC stable  Onnie Boer, PharmD, [place], AAHIVP, CPP Infectious Disease Pharmacist 11/22/2018 2:05 PM

## 2018-11-23 LAB — BASIC METABOLIC PANEL
Anion gap: 12 (ref 5–15)
BUN: 42 mg/dL — ABNORMAL HIGH (ref 8–23)
CO2: 25 mmol/L (ref 22–32)
Calcium: 8.2 mg/dL — ABNORMAL LOW (ref 8.9–10.3)
Chloride: 101 mmol/L (ref 98–111)
Creatinine, Ser: 1.26 mg/dL — ABNORMAL HIGH (ref 0.44–1.00)
GFR calc Af Amer: 53 mL/min — ABNORMAL LOW (ref >60)
GFR calc non Af Amer: 45 mL/min — ABNORMAL LOW (ref >60)
Glucose, Bld: 111 mg/dL — ABNORMAL HIGH (ref 70–99)
Potassium: 3.7 mmol/L (ref 3.5–5.1)
Sodium: 138 mmol/L (ref 135–145)

## 2018-11-23 LAB — PROCALCITONIN: Procalcitonin: <0.1 ng/mL

## 2018-11-23 MED ORDER — GUAIFENESIN-DM 100-10 MG/5ML PO SYRP
5.0000 mL | ORAL_SOLUTION | ORAL | Status: DC | PRN
  Administered 2018-12-01: 08:00:00 5 mL via ORAL
  Filled 2018-11-23: qty 10

## 2018-11-23 MED ORDER — HYDROCOD POLST-CPM POLST ER 10-8 MG/5ML PO SUER
5.0000 mL | Freq: Two times a day (BID) | ORAL | Status: DC | PRN
  Administered 2018-11-28 – 2018-11-30 (×4): 5 mL via ORAL
  Filled 2018-11-23 (×4): qty 5

## 2018-11-23 MED ORDER — BENZONATATE 100 MG PO CAPS
100.0000 mg | ORAL_CAPSULE | Freq: Three times a day (TID) | ORAL | Status: AC
  Administered 2018-11-23 – 2018-11-24 (×6): 100 mg via ORAL
  Filled 2018-11-23 (×6): qty 1

## 2018-11-23 NOTE — Progress Notes (Signed)
Physical Therapy Treatment Patient Details Name: Shelley Allison MRN: [id number] DOB: 10/02/1955 Today's Date: 11/23/2018    History of Present Illness 63 y/o female pt w/ hx of HTN, obesity, cerebral artery aneurysm rupture presented to ED  09/28 w/ N/V/D w/ progressive SOB and fever starting 09/27. precipitating factor was attending a wedding. In Ed dx w/ SARS-CoV-2 and moved to ICU 09/30.    PT Comments    Pt did well with mobility this pm, needs encouragement to push herself a little more. She is capable of much more than she allows. Was able to ambulate in room 1 x 68ft, 2 x 54ft with seated rest between each attempt. Pt is at SBA-mod I, needing line management help. Throughout session was on 15L/min via HFNC, when monitored on finger probe sats dropped to low 80s but when changed to earlobe probe sats in low 90s throughout.    Follow Up Recommendations  No PT follow up     Equipment Recommendations       Recommendations for Other Services       Precautions / Restrictions Precautions Precautions: Fall;Other (comment) Precaution Comments: desats with activity Restrictions Weight Bearing Restrictions: No    Mobility  Bed Mobility Overal bed mobility: Modified Independent                Transfers Overall transfer level: Needs assistance   Transfers: Sit to/from Stand Sit to Stand: Supervision Stand pivot transfers: (from bed and commode)       General transfer comment: needs lines management   Ambulation/Gait Ambulation/Gait assistance: Supervision Gait Distance (Feet): 88 Feet Assistive device: None Gait Pattern/deviations: WFL(Within Functional Limits)     General Gait Details: 1x 48, 2x 88 seated rest between each   Stairs             Wheelchair Mobility    Modified Rankin (Stroke Patients Only)       Balance Overall balance assessment: Modified Independent(able to stand at sink unsupported and wash face )                                          Cognition Arousal/Alertness: Awake/alert Behavior During Therapy: WFL for tasks assessed/performed Overall Cognitive Status: Within Functional Limits for tasks assessed                                        Exercises      General Comments General comments (skin integrity, edema, etc.): Pt needs encouragement to push herself slightly further each time, she is functionally very independent but activity tolerance is what is limiting her, was able to walk around room w/ SBA and line management help, initially pt sats dropped but nurse noticed it may be sensor not picking up, was on pedi finger probe, changed to earlobe probe and sats reading in low 90s throughout. was able to ambulate in room x 42ft, walk into rest room and wash face at sink. sats stayed in low 90s but pt c/o dizziness and needing to sit down. sat down on commode for 2-3 mins then ambulated back into room.  once sensor was changed to earlobe sats remained in low 90s, Pt on 15L/min via HFNC.      Pertinent Vitals/Pain Pain Assessment: No/denies pain    Home Living Family/patient  expects to be discharged to:: Private residence Living Arrangements: Children;Parent                  Prior Function            PT Goals (current goals can now be found in the care plan section) Acute Rehab PT Goals Patient Stated Goal: go home PT Goal Formulation: With patient Time For Goal Achievement: 12/05/18 Potential to Achieve Goals: Good    Frequency    Min 3X/week      PT Plan      Co-evaluation              AM-PAC PT "6 Clicks" Mobility   Outcome Measure  Help needed turning from your back to your side while in a flat bed without using bedrails?: None Help needed moving from lying on your back to sitting on the side of a flat bed without using bedrails?: None Help needed moving to and from a bed to a chair (including a wheelchair)?: None Help needed standing  up from a chair using your arms (e.g., wheelchair or bedside chair)?: None Help needed to walk in hospital room?: A Little Help needed climbing 3-5 steps with a railing? : A Little 6 Click Score: 22    End of Session Equipment Utilized During Treatment: Oxygen Activity Tolerance: Treatment limited secondary to medical complications (Comment) Patient left: in bed;with call bell/phone within reach   PT Visit Diagnosis: Other abnormalities of gait and mobility (R26.89);Muscle weakness (generalized) (M62.81)     Time: 9678-9381 PT Time Calculation (min) (ACUTE ONLY): 26 min  Charges:  $Gait Training: 8-22 mins $Therapeutic Exercise: 8-22 mins                     Drema Pry, PT    Shelley Allison 11/23/2018, 5:14 PM

## 2018-11-23 NOTE — Progress Notes (Signed)
[place] TEAM 1 - Stepdown/ICU TEAM  RANAE CASEBIER  FYB:[id number] DOB: [birth year] DOA: 11/04/2018 PCP: Leeroy Bock, DO    Brief Narrative:  [phone] with a history of HTN and cerebral artery aneurysm rupture who presented to the ED 9/28 with 1 week of nausea vomiting and diarrhea followed by progressive shortness of breath with fever which began on 9/27.  She had attended a large wedding prior to the onset of her symptoms.  In the ED she was found to be hypoxic and confirmed to be SARS-CoV-2 positive.  Chest x-ray revealed bilateral infiltrates.  Significant Events: 9/21 approximate onset of GI symptoms 9/27 onset of pulmonary symptoms 9/28 admit  9/30 transfer to ICU  COVID-19 specific Treatment: Remdesivir 9/28 >10/2 Solumedrol 9/28 > 10/7 Actemra 9/29 Convalescent plasma - patient declined  Subjective: Continues to require high level nasal cannula support at 15 L [place] HFNC.  Clinically however she states she feels much better.  Indeed she appears to be stable medically.  She has no new complaints.  Assessment & Plan:  COVID pneumonia - acute hypoxic respiratory failure CT angio chest negative for PE 10/3 -bilateral lower extremity venous duplex negative for DVT -continues to require high level oxygen support - climbing creatinine now limiting attempts to further diuresis - wean oxygen as able - ambulate - incentive spirometer - has completed Solu-Medrol Remdesivir and Actemra treatment - currently still requiring too much oxygen support to allow discharge home  Recent Labs  Lab 11/17/18 0135 11/18/18 0114 11/19/18 0320 11/20/18 0445 11/21/18 0515 11/22/18 0254  DDIMER 8.93* 10.19* 10.29* 8.53* 7.13* 3.66*  FERRITIN  --   --   --   --   --  513*  CRP 1.6* 1.0* <0.8 <0.8 <0.8 <0.8  ALT 68* 85* 100* 88* 68* 56*  PROCALCITON 0.67  --   --   --   --   --     HTN Blood pressure presently controlled  Acute kidney injury Creatinine peaked at 1.43 - continue to  follow creatinine with attempts to diurese  Recent Labs  Lab 11/19/18 0320 11/20/18 0445 11/21/18 0515 11/22/18 0254 11/23/18 0145  CREATININE 0.87 0.84 0.90 1.08* 1.26*    Morbid obesity - Estimated body mass index is 34.52 kg/m as calculated from the following:   Height as of this encounter: 5\' 5"  (1.651 m).   Weight as of this encounter: 94.1 kg.   Transaminitis Nearly resolved at this time -likely due to a virus itself or Remdesivir  Recent Labs  Lab 11/18/18 0114 11/19/18 0320 11/20/18 0445 11/21/18 0515 11/22/18 0254  AST 70* 67* 44* 27 21  ALT 85* 100* 88* 68* 56*  ALKPHOS 82 85 95 90 82  BILITOT 0.7 0.6 0.8 1.1 1.1  PROT 6.4* 6.1* 6.3* 6.3* 6.2*  ALBUMIN 2.8* 2.7* 2.8* 2.9* 3.0*    History of ruptured cerebral artery aneurysm   DVT prophylaxis: Lovenox Code Status: FULL CODE Family Communication:  Disposition Plan: Discharge home when oxygen requirement allows this  Consultants:  PCCM  Antimicrobials:  Ceftriaxone 9/28 > 10/1  Azithromycin 9/28 > 9/29  Objective: Blood pressure 102/65, pulse 88, temperature 98.4 F (36.9 C), temperature source Oral, resp. rate 18, height 5\' 5"  (1.651 m), weight 94.1 kg, SpO2 90 %.  Intake/Output Summary (Last 24 hours) at 11/23/2018 0816 Last data filed at 11/23/2018 0500 Gross per 24 hour  Intake -  Output 575 ml  Net -575 ml   Filed Weights   11/16/18 0259  11/21/18 0600 11/22/18 0600  Weight: 98.9 kg 95.9 kg 94.1 kg    Examination: General: No acute respiratory distress Lungs: Fine crackles throughout Cardiovascular: RRR  Abdomen: NT/ND, soft, BS positive, no rebound Extremities: No C/C/E B LE   CBC: Recent Labs  Lab 11/19/18 0320 11/20/18 0445 11/21/18 0515 11/22/18 0254  WBC 14.7* 14.3* 13.2* 9.9  NEUTROABS 13.1* 12.6* 11.8*  --   HGB 14.1 14.1 13.9 14.0  HCT 44.1 44.5 44.0 44.5  MCV 87.2 86.6 87.0 86.7  PLT 293 330 270 706   Basic Metabolic Panel: Recent Labs  Lab 11/17/18 0135  11/18/18 0114  11/21/18 0515 11/22/18 0254 11/23/18 0145  NA 139 138   < > 136 137 138  K 4.2 4.2   < > 4.7 4.5 3.7  CL 104 104   < > 100 98 101  CO2 26 25   < > 23 25 25   GLUCOSE 138* 136*   < > 124* 126* 111*  BUN 23 25*   < > 28* 38* 42*  CREATININE 0.87 0.92   < > 0.90 1.08* 1.26*  CALCIUM 8.5* 8.2*   < > 8.4* 8.5* 8.2*  MG 2.2  --   --   --   --   --   PHOS 2.8 3.4  --   --   --   --    < > = values in this interval not displayed.   GFR: Estimated Creatinine Clearance: 51.8 mL/min (A) (by C-G formula based on SCr of 1.26 mg/dL (H)).  Liver Function Tests: Recent Labs  Lab 11/19/18 0320 11/20/18 0445 11/21/18 0515 11/22/18 0254  AST 67* 44* 27 21  ALT 100* 88* 68* 56*  ALKPHOS 85 95 90 82  BILITOT 0.6 0.8 1.1 1.1  PROT 6.1* 6.3* 6.3* 6.2*  ALBUMIN 2.7* 2.8* 2.9* 3.0*    HbA1C: Hemoglobin A1C  Date/Time Value Ref Range Status  05/15/2015 03:52 PM 5.5  Final  04/25/2014 04:43 PM 5.8  Final    Scheduled Meds: . Chlorhexidine Gluconate Cloth  6 each Topical Daily  . enoxaparin (LOVENOX) injection  50 mg Subcutaneous Q24H  . furosemide  40 mg Intravenous BID  . lisinopril  10 mg Oral Daily  . metoprolol tartrate  50 mg Oral BID     LOS: 11 days   Cherene Altes, MD [facility] Office  [phone] Pager - Text Page per Amion  If 7PM-7AM, please contact night-coverage per Amion 11/23/2018, 8:16 AM

## 2018-11-24 MED ORDER — OXYCODONE HCL 5 MG PO TABS: 5.0000 mg | ORAL_TABLET | ORAL | Status: DC | PRN

## 2018-11-24 MED ORDER — SALINE SPRAY 0.65 % NA SOLN
1.0000 | NASAL | Status: DC | PRN
  Filled 2018-11-24: qty 44

## 2018-11-24 NOTE — Progress Notes (Signed)
Patient visited with her mother in room, who is also a patient at [facility].

## 2018-11-24 NOTE — Progress Notes (Signed)
Walking test performed with patient.  Patient started on 2L [place] saturating 94%, but desaturated to 80% within a few steps, increased to 5L [place].  Unable to ambulate in hallway as patient stated she was having trouble breathing with mask.  Ambulated a total of approximately 30' in room.  Patient appeared slightly SOB and reported lightheadedness.  Pt assisted back to bed, approximately two minute recovery time and returned to 2L [place], saturating 92%.  IS use and deep breathing strongly encouraged. Will continue to monitor closely and titrate O2 as able.

## 2018-11-24 NOTE — Progress Notes (Signed)
[name] TEAM 1 - Stepdown/ICU TEAM  Shelley Allison  HMC:[id number] DOB: Oct 16, 1955 DOA: 11/07/2018 PCP: Leeroy Bock, DO    Brief Narrative:  [phone] with a history of HTN and cerebral artery aneurysm rupture who presented to the ED 9/28 with 1 week of nausea vomiting and diarrhea followed by progressive shortness of breath with fever which began on 9/27.  She had attended a large wedding prior to the onset of her symptoms.  In the ED she was found to be hypoxic and confirmed to be SARS-CoV-2 positive.  Chest x-ray revealed bilateral infiltrates.  Significant Events: 9/21 approximate onset of GI symptoms 9/27 onset of pulmonary symptoms 9/28 admit  9/30 transfer to ICU  COVID-19 specific Treatment: Remdesivir 9/28 >10/2 Solumedrol 9/28 > 10/7 Actemra 9/29 Convalescent plasma - patient declined  Subjective: Oxygen saturations have improved greatly over the last 24 hours.  Patient continues to feel better and better.  She has now been weaned to 3-5 L conventional nasal cannula and is in no distress.  Denies chest pain nausea vomiting abdominal pain.  Assessment & Plan:  COVID pneumonia - acute hypoxic respiratory failure CT angio chest negative for PE 10/3 -bilateral lower extremity venous duplex negative for DVT -continue to wean oxygen as able - ambulate - incentive spirometer - has completed Solu-Medrol Remdesivir and Actemra treatment - probable d/c home in 24-48hrs if able to ambulate without requiring high level O2 support   HTN Blood pressure controlled  Acute kidney injury Creatinine peaked at 1.43 -recheck in a.m.  Recent Labs  Lab 11/19/18 0320 11/20/18 0445 11/21/18 0515 11/22/18 0254 11/23/18 0145  CREATININE 0.87 0.84 0.90 1.08* 1.26*    Morbid obesity - Estimated body mass index is 34.52 kg/m as calculated from the following:   Height as of this encounter: 5\' 5"  (1.651 m).   Weight as of this encounter: 94.1 kg.   Transaminitis Nearly resolved -  likely due to a virus itself or Remdesivir -follow-up in a.m.  Recent Labs  Lab 11/18/18 0114 11/19/18 0320 11/20/18 0445 11/21/18 0515 11/22/18 0254  AST 70* 67* 44* 27 21  ALT 85* 100* 88* 68* 56*  ALKPHOS 82 85 95 90 82  BILITOT 0.7 0.6 0.8 1.1 1.1  PROT 6.4* 6.1* 6.3* 6.3* 6.2*  ALBUMIN 2.8* 2.7* 2.8* 2.9* 3.0*    History of ruptured cerebral artery aneurysm   DVT prophylaxis: Lovenox Code Status: FULL CODE Family Communication:  Disposition Plan: Discharge home when oxygen requirement allows  Consultants:  PCCM  Antimicrobials:  Ceftriaxone 9/28 > 10/1  Azithromycin 9/28 > 9/29  Objective: Blood pressure (!) 95/50, pulse 94, temperature 98.2 F (36.8 C), temperature source Oral, resp. rate 18, height 5\' 5"  (1.651 m), weight 94.1 kg, SpO2 91 %.  Intake/Output Summary (Last 24 hours) at 11/24/2018 0834 Last data filed at 11/23/2018 0900 Gross per 24 hour  Intake 10 ml  Output -  Net 10 ml   Filed Weights   11/16/18 0259 11/21/18 0600 11/22/18 0600  Weight: 98.9 kg 95.9 kg 94.1 kg    Examination: General: No acute respiratory distress Lungs: Improved air movement in all fields with no wheezing Cardiovascular: RRR -no murmur Abdomen: NT/ND, soft, BS+ Extremities: No C/C/E B lower extremities  CBC: Recent Labs  Lab 11/19/18 0320 11/20/18 0445 11/21/18 0515 11/22/18 0254  WBC 14.7* 14.3* 13.2* 9.9  NEUTROABS 13.1* 12.6* 11.8*  --   HGB 14.1 14.1 13.9 14.0  HCT 44.1 44.5 44.0 44.5  MCV 87.2  86.6 87.0 86.7  PLT 293 330 270 127   Basic Metabolic Panel: Recent Labs  Lab 11/18/18 0114  11/21/18 0515 11/22/18 0254 11/23/18 0145  NA 138   < > 136 137 138  K 4.2   < > 4.7 4.5 3.7  CL 104   < > 100 98 101  CO2 25   < > 23 25 25   GLUCOSE 136*   < > 124* 126* 111*  BUN 25*   < > 28* 38* 42*  CREATININE 0.92   < > 0.90 1.08* 1.26*  CALCIUM 8.2*   < > 8.4* 8.5* 8.2*  PHOS 3.4  --   --   --   --    < > = values in this interval not displayed.    GFR: Estimated Creatinine Clearance: 51.8 mL/min (A) (by C-G formula based on SCr of 1.26 mg/dL (H)).  Liver Function Tests: Recent Labs  Lab 11/19/18 0320 11/20/18 0445 11/21/18 0515 11/22/18 0254  AST 67* 44* 27 21  ALT 100* 88* 68* 56*  ALKPHOS 85 95 90 82  BILITOT 0.6 0.8 1.1 1.1  PROT 6.1* 6.3* 6.3* 6.2*  ALBUMIN 2.7* 2.8* 2.9* 3.0*    HbA1C: Hemoglobin A1C  Date/Time Value Ref Range Status  05/15/2015 03:52 PM 5.5  Final  04/25/2014 04:43 PM 5.8  Final    Scheduled Meds: . benzonatate  100 mg Oral TID  . Chlorhexidine Gluconate Cloth  6 each Topical Daily  . enoxaparin (LOVENOX) injection  50 mg Subcutaneous Q24H  . lisinopril  10 mg Oral Daily  . metoprolol tartrate  50 mg Oral BID     LOS: 12 days   Cherene Altes, MD [facility] Office  [phone] Pager - Text Page per Amion  If 7PM-7AM, please contact night-coverage per Amion 11/24/2018, 8:34 AM

## 2018-11-25 LAB — COMPREHENSIVE METABOLIC PANEL
ALT: 39 U/L (ref 0–44)
AST: 24 U/L (ref 15–41)
Albumin: 2.7 g/dL — ABNORMAL LOW (ref 3.5–5.0)
Alkaline Phosphatase: 63 U/L (ref 38–126)
Anion gap: 8 (ref 5–15)
BUN: 27 mg/dL — ABNORMAL HIGH (ref 8–23)
CO2: 26 mmol/L (ref 22–32)
Calcium: 8 mg/dL — ABNORMAL LOW (ref 8.9–10.3)
Chloride: 103 mmol/L (ref 98–111)
Creatinine, Ser: 1.08 mg/dL — ABNORMAL HIGH (ref 0.44–1.00)
GFR calc Af Amer: >60 mL/min (ref >60)
GFR calc non Af Amer: 55 mL/min — ABNORMAL LOW (ref >60)
Glucose, Bld: 93 mg/dL (ref 70–99)
Potassium: 4.2 mmol/L (ref 3.5–5.1)
Sodium: 137 mmol/L (ref 135–145)
Total Bilirubin: 0.9 mg/dL (ref 0.3–1.2)
Total Protein: 5.9 g/dL — ABNORMAL LOW (ref 6.5–8.1)

## 2018-11-25 MED ORDER — FUROSEMIDE 10 MG/ML IJ SOLN
40.0000 mg | Freq: Two times a day (BID) | INTRAMUSCULAR | Status: DC
  Administered 2018-11-25 – 2018-11-26 (×2): 40 mg via INTRAVENOUS
  Filled 2018-11-25 (×2): qty 4

## 2018-11-25 NOTE — Progress Notes (Addendum)
[place] TEAM 1 - Stepdown/ICU TEAM  Shelley Allison  CZY:[id number] DOB: [birth year] DOA: 10/22/2018 PCP: Richarda Osmond, DO    Brief Narrative:  [phone] with a history of HTN and cerebral artery aneurysm rupture who presented to the ED 9/28 with 1 week of nausea vomiting and diarrhea followed by progressive shortness of breath with fever which began on 9/27.  She had attended a large wedding prior to the onset of her symptoms.  In the ED she was found to be hypoxic and confirmed to be SARS-CoV-2 positive.  Chest x-ray revealed bilateral infiltrates.  Significant Events: 9/21 approximate onset of GI symptoms 9/27 onset of pulmonary symptoms 9/28 admit  9/30 transfer to ICU 10/3 transfer back to PCU  COVID-19 specific Treatment: Remdesivir 9/28 >10/2 Solumedrol 9/28 > 10/7 Actemra 9/29 Convalescent plasma - patient declined  Subjective: Saturating in the upper 90s on 2 L nasal cannula when resting.  Saturations dropping as low as 80% with exertion requiring increased to 5 L nasal cannula.  She feels that she is slowly improving day by day.  She denies chest pain nausea vomiting or abdominal pain.  Per RN notes this morning: Patient up to [facility] desaturated to upper 70's and reporting exertional dyspnea while on BSC. Pt assisted back to bed. Oxygen increased to 9L HFNC to maintain saturation at 90%.  Assessment & Plan:  COVID pneumonia - acute hypoxic respiratory failure CT angio chest negative for PE 10/3 -bilateral lower extremity venous duplex negative for DVT -continue to wean oxygen as able - ambulate - incentive spirometer - has completed Solu-Medrol, Remdesivir, and Actemra treatment - not yet ready for discharge home due to high level requirement for oxygen support with exertion  HTN Blood pressure controlled  Acute kidney injury Creatinine peaked at 1.43 -creatinine trending downward  Recent Labs  Lab 11/20/18 0445 11/21/18 0515 11/22/18 0254 11/23/18 0145 11/25/18  0358  CREATININE 0.84 0.90 1.08* 1.26* 1.08*    Morbid obesity - Estimated body mass index is 33.31 kg/m as calculated from the following:   Height as of this encounter: 5\' 5"  (1.651 m).   Weight as of this encounter: 90.8 kg.   Transaminitis resolved - likely due to the virus itself +/- Remdesivir  Recent Labs  Lab 11/19/18 0320 11/20/18 0445 11/21/18 0515 11/22/18 0254 11/25/18 0358  AST 67* 44* 27 21 24   ALT 100* 88* 68* 56* 39  ALKPHOS 85 95 90 82 63  BILITOT 0.6 0.8 1.1 1.1 0.9  PROT 6.1* 6.3* 6.3* 6.2* 5.9*  ALBUMIN 2.7* 2.8* 2.9* 3.0* 2.7*    History of ruptured cerebral artery aneurysm   DVT prophylaxis: Lovenox Code Status: FULL CODE Family Communication:  Disposition Plan: Discharge home when oxygen requirement allows -high risk for failure and immediate return if attempts made to discharge home today  Consultants:  PCCM  Antimicrobials:  Ceftriaxone 9/28 > 10/1  Azithromycin 9/28 > 9/29  Objective: Blood pressure (!) 113/56, pulse 90, temperature 98.8 F (37.1 C), temperature source Oral, resp. rate 18, height 5\' 5"  (1.651 m), weight 90.8 kg, SpO2 (!) 89 %.  Intake/Output Summary (Last 24 hours) at 11/25/2018 1442 Last data filed at 11/25/2018 0900 Gross per 24 hour  Intake 240 ml  Output -  Net 240 ml   Filed Weights   11/21/18 0600 11/22/18 0600 11/25/18 0500  Weight: 95.9 kg 94.1 kg 90.8 kg    Examination: General: No acute respiratory distress -alert and oriented Lungs: Good air movement throughout with  fine crackles diffusely Cardiovascular: RRR Abdomen: NT/ND, soft, BS+ Extremities: No C/C/E B LE   CBC: Recent Labs  Lab 11/19/18 0320 11/20/18 0445 11/21/18 0515 11/22/18 0254  WBC 14.7* 14.3* 13.2* 9.9  NEUTROABS 13.1* 12.6* 11.8*  --   HGB 14.1 14.1 13.9 14.0  HCT 44.1 44.5 44.0 44.5  MCV 87.2 86.6 87.0 86.7  PLT 293 330 270 257   Basic Metabolic Panel: Recent Labs  Lab 11/22/18 0254 11/23/18 0145 11/25/18 0358  NA  137 138 137  K 4.5 3.7 4.2  CL 98 101 103  CO2 25 25 26   GLUCOSE 126* 111* 93  BUN 38* 42* 27*  CREATININE 1.08* 1.26* 1.08*  CALCIUM 8.5* 8.2* 8.0*   GFR: Estimated Creatinine Clearance: 59.3 mL/min (A) (by C-G formula based on SCr of 1.08 mg/dL (H)).  Liver Function Tests: Recent Labs  Lab 11/20/18 0445 11/21/18 0515 11/22/18 0254 11/25/18 0358  AST 44* 27 21 24   ALT 88* 68* 56* 39  ALKPHOS 95 90 82 63  BILITOT 0.8 1.1 1.1 0.9  PROT 6.3* 6.3* 6.2* 5.9*  ALBUMIN 2.8* 2.9* 3.0* 2.7*    HbA1C: Hemoglobin A1C  Date/Time Value Ref Range Status  05/15/2015 03:52 PM 5.5  Final  04/25/2014 04:43 PM 5.8  Final    Scheduled Meds: . Chlorhexidine Gluconate Cloth  6 each Topical Daily  . enoxaparin (LOVENOX) injection  50 mg Subcutaneous Q24H  . lisinopril  10 mg Oral Daily  . metoprolol tartrate  50 mg Oral BID     LOS: 13 days   05/17/2015, MD [facility] Office  [phone] Pager - Text Page per Amion  If 7PM-7AM, please contact night-coverage per Amion 11/25/2018, 2:42 PM

## 2018-11-25 NOTE — Progress Notes (Signed)
Patient up to [facility] desaturated to upper 70's and reporting exertional dyspnea while on BSC. Pt assisted back to bed. Oxygen increased to 9L HFNC to maintain saturation at 90%. IS use and deep breathing strongly encouraged. Nira Conn, RN and Dr. Thereasa Solo made aware of oxygen requirement.

## 2018-11-26 ENCOUNTER — Inpatient Hospital Stay (HOSPITAL_COMMUNITY): Payer: BC Managed Care – PPO

## 2018-11-26 MED ORDER — FUROSEMIDE 10 MG/ML IJ SOLN
60.0000 mg | Freq: Two times a day (BID) | INTRAMUSCULAR | Status: DC
  Administered 2018-11-26 – 2018-11-28 (×4): 60 mg via INTRAVENOUS
  Filled 2018-11-26 (×4): qty 6

## 2018-11-26 MED ORDER — ENSURE ENLIVE PO LIQD
237.0000 mL | Freq: Two times a day (BID) | ORAL | Status: DC
  Administered 2018-11-27 – 2018-11-30 (×7): 237 mL via ORAL

## 2018-11-26 NOTE — Care Management (Signed)
Spoke w patient to let her know that her mother in rm 36 will DC today to home.

## 2018-11-26 NOTE — Progress Notes (Signed)
[place] TEAM 1 - Stepdown/ICU TEAM  JOHARA LODWICK  MLJ:[id number] DOB: [birth year] DOA: 11/03/2018 PCP: Leeroy Bock, DO    Brief Narrative:  [phone] with a history of HTN and cerebral artery aneurysm rupture who presented to the ED 9/28 with 1 week of nausea vomiting and diarrhea followed by progressive shortness of breath with fever which began on 9/27.  She had attended a large wedding prior to the onset of her symptoms.  In the ED she was found to be hypoxic and confirmed to be SARS-CoV-2 positive.  Chest x-ray revealed bilateral infiltrates.  Significant Events: 9/21 approximate onset of GI symptoms 9/27 onset of pulmonary symptoms 9/28 admit  9/30 transfer to ICU 10/3 transfer back to PCU  COVID-19 specific Treatment: Remdesivir 9/28 >10/2 Solumedrol 9/28 > 10/7 Actemra 9/29 Convalescent plasma - patient declined  Subjective: Significant desaturations with exertion have been persisting as the reason for the patient's ongoing hospital stay.  I have reviewed her follow-up chest x-ray from this morning which notes stable bilateral pulmonary infiltrates.  No new complaints of the patient.  She appears to very slowly making improvement.  Assessment & Plan:  COVID pneumonia - acute hypoxic respiratory failure CT angio chest negative for PE 10/3 -bilateral lower extremity venous duplex negative for DVT -continue to wean oxygen as able - ambulate - incentive spirometer - has completed Solu-Medrol, Remdesivir, and Actemra treatment - still not yet ready for discharge home due to high level requirement for oxygen support with exertion  HTN Blood pressure controlled  Acute kidney injury Creatinine peaked at 1.43 -creatinine trending downward -monitor with ongoing diuresis  Recent Labs  Lab 11/20/18 0445 11/21/18 0515 11/22/18 0254 11/23/18 0145 11/25/18 0358  CREATININE 0.84 0.90 1.08* 1.26* 1.08*    Morbid obesity - Estimated body mass index is 25.17 kg/m as  calculated from the following:   Height as of this encounter: 5\' 5"  (1.651 m).   Weight as of this encounter: 68.6 kg.   Transaminitis resolved - likely due to the virus itself +/- Remdesivir  History of ruptured cerebral artery aneurysm   DVT prophylaxis: Lovenox Code Status: FULL CODE Family Communication:  Disposition Plan: Discharge home when oxygen requirement allows -high risk for failure and immediate return if attempts made to discharge home today  Consultants:  PCCM  Antimicrobials:  Ceftriaxone 9/28 > 10/1  Azithromycin 9/28 > 9/29  Objective: Blood pressure 112/66, pulse 80, temperature 98.6 F (37 C), temperature source Oral, resp. rate 14, height 5\' 5"  (1.651 m), weight 68.6 kg, SpO2 94 %.  Intake/Output Summary (Last 24 hours) at 11/26/2018 0803 Last data filed at 11/26/2018 0333 Gross per 24 hour  Intake 1040 ml  Output 800 ml  Net 240 ml   Filed Weights   11/25/18 0500 11/26/18 0500 11/26/18 0507  Weight: 90.8 kg 68.6 kg 68.6 kg    Examination: General: No acute respiratory distress  Lungs: fine crackles diffusely Cardiovascular: RRR Abdomen: NT/ND, soft, BS+ Extremities: 1+ edema bilateral lower extremities  CBC: Recent Labs  Lab 11/20/18 0445 11/21/18 0515 11/22/18 0254  WBC 14.3* 13.2* 9.9  NEUTROABS 12.6* 11.8*  --   HGB 14.1 13.9 14.0  HCT 44.5 44.0 44.5  MCV 86.6 87.0 86.7  PLT 330 270 257   Basic Metabolic Panel: Recent Labs  Lab 11/22/18 0254 11/23/18 0145 11/25/18 0358  NA 137 138 137  K 4.5 3.7 4.2  CL 98 101 103  CO2 25 25 26   GLUCOSE 126* 111* 93  BUN 38* 42* 27*  CREATININE 1.08* 1.26* 1.08*  CALCIUM 8.5* 8.2* 8.0*   GFR: Estimated Creatinine Clearance: 51.8 mL/min (A) (by C-G formula based on SCr of 1.08 mg/dL (H)).  Liver Function Tests: Recent Labs  Lab 11/20/18 0445 11/21/18 0515 11/22/18 0254 11/25/18 0358  AST 44* 27 21 24   ALT 88* 68* 56* 39  ALKPHOS 95 90 82 63  BILITOT 0.8 1.1 1.1 0.9  PROT  6.3* 6.3* 6.2* 5.9*  ALBUMIN 2.8* 2.9* 3.0* 2.7*    HbA1C: Hemoglobin A1C  Date/Time Value Ref Range Status  05/15/2015 03:52 PM 5.5  Final  04/25/2014 04:43 PM 5.8  Final    Scheduled Meds: . Chlorhexidine Gluconate Cloth  6 each Topical Daily  . enoxaparin (LOVENOX) injection  50 mg Subcutaneous Q24H  . furosemide  40 mg Intravenous Q12H  . lisinopril  10 mg Oral Daily  . metoprolol tartrate  50 mg Oral BID     LOS: 14 days   Cherene Altes, MD [facility] Office  [phone] Pager - Text Page per Amion  If 7PM-7AM, please contact night-coverage per Amion 11/26/2018, 8:03 AM

## 2018-11-26 NOTE — Progress Notes (Signed)
Initial Nutrition Assessment RD working remotely.  DOCUMENTATION CODES:   Not applicable  INTERVENTION:    Ensure Enlive po BID, each supplement provides 350 kcal and 20 grams of protein   MVI daily  Pt receiving Hormel Shake daily with Breakfast which provides 520 kcals and 22 g of protein and Magic cup BID with lunch and dinner, each supplement provides 290 kcal and 9 grams of protein, automatically on meal trays to optimize nutritional intake.  NUTRITION DIAGNOSIS:   Increased nutrient needs related to acute illness as evidenced by estimated needs.  GOAL:   Patient will meet greater than or equal to 90% of their needs   MONITOR:   PO intake, Supplement acceptance  REASON FOR ASSESSMENT:   LOS    ASSESSMENT:   63 yo female admitted with N/V/D and SOB which began on 9/27 after attending a big wedding. COVID-19 positive in the ED. PMH includes HTN, cerebral artery aneurysm rupture.   Labs reviewed. Medications reviewed and include lasix.   For the first week of her admission, patient was eating well, consuming 75-100% of meals. Intake was poor for a few days, but has picked back up the past two days.   Patient reports usual weight ~180s-190s. She thinks the bed scale was messed up this morning when RN weighed her at 150 lbs. She says she was ~200 lbs on admission. Patient agreed to receive Ensure supplements to maximize intake of protein and calories. Encouraged intake of meals and supplements.   NUTRITION - FOCUSED PHYSICAL EXAM:  deferred  Diet Order:   Diet Order            Diet regular Room service appropriate? Yes; Fluid consistency: Thin  Diet effective now              EDUCATION NEEDS:   Not appropriate for education at this time  Skin:  Skin Assessment: Reviewed RN Assessment  Last BM:  10/11  Height:   Ht Readings from Last 1 Encounters:  10/19/2018 5\' 5"  (1.651 m)    Weight:   Wt Readings from Last 1 Encounters:  11/26/18 68.6 kg     Ideal Body Weight:  56.8 kg  BMI:  Body mass index is 25.17 kg/m.  Estimated Nutritional Needs:   Kcal:  2000-2200  Protein:  115-130 gm  Fluid:  >/= 1.8 L    Molli Barrows, RD, LDN, [place] Pager [phone] After Hours Pager [phone]

## 2018-11-27 LAB — BASIC METABOLIC PANEL
Anion gap: 11 (ref 5–15)
BUN: 22 mg/dL (ref 8–23)
CO2: 26 mmol/L (ref 22–32)
Calcium: 8.3 mg/dL — ABNORMAL LOW (ref 8.9–10.3)
Chloride: 103 mmol/L (ref 98–111)
Creatinine, Ser: 1.16 mg/dL — ABNORMAL HIGH (ref 0.44–1.00)
GFR calc Af Amer: 58 mL/min — ABNORMAL LOW (ref >60)
GFR calc non Af Amer: 50 mL/min — ABNORMAL LOW (ref >60)
Glucose, Bld: 109 mg/dL — ABNORMAL HIGH (ref 70–99)
Potassium: 4.1 mmol/L (ref 3.5–5.1)
Sodium: 140 mmol/L (ref 135–145)

## 2018-11-27 NOTE — Progress Notes (Addendum)
Physical Therapy Treatment Patient Details Name: Shelley Allison MRN: [id number] DOB: [birth year] Today's Date: 11/27/2018    History of Present Illness 63 y/o female pt w/ hx of HTN, obesity, cerebral artery aneurysm rupture presented to ED  09/28 w/ N/V/D w/ progressive SOB and fever starting 09/27. precipitating factor was attending a wedding. In Ed dx w/ SARS-CoV-2 and moved to ICU 09/30.    PT Comments    The patient received on 4 L [place]. Pulse ox on left ear with poor pleth readings of 85-95% to no reading.  Patient with noticeable SOB after  Moving to [facility] with at least 5 minutes to rest before moving to recliner.Placed forehead Nelcor probe with 66% reading. Placed on 4 then 8 L HFNC to increase SPO2 to 85% over 8 minutes, Patient with abdominal breathing, RR 35. And HR 116, Patient gradually slowed RR and SPO2 increased to 88%(forehead ) and left ear reading 95% after new probe placed. Patient clearly struggles with SOB with minimal activity. Pt ambulated x 88' last week.  RN reports having difficulty getting a reading from either ear, has changed probe several times.   There appeared a significant difference In forehead vs ear probe  Readings but with noted significant SOB  With activity.. Will continue to monitor SPO2..  Follow Up Recommendations  Home health PT(if continues to be SOB and on O2)     Equipment Recommendations       Recommendations for Other Services       Precautions / Restrictions Precautions Precaution Comments: desats with activity    Mobility  Bed Mobility               General bed mobility comments: pt recieved sitting in recliner  Transfers   Equipment used: None Transfers: Sit to/from Stand Sit to Stand: Supervision Stand pivot transfers: Supervision       General transfer comment: actually pt had moved herself from [facility] to recliner while PT stepped out.  Ambulation/Gait                 Stairs             Wheelchair  Mobility    Modified Rankin (Stroke Patients Only)       Balance                                            Cognition Arousal/Alertness: Awake/alert Behavior During Therapy: Anxious                                   General Comments: with being SOB      Exercises      General Comments        Pertinent Vitals/Pain Pain Assessment: No/denies pain    Home Living                      Prior Function            PT Goals (current goals can now be found in the care plan section) Progress towards PT goals: Progressing toward goals    Frequency    Min 3X/week      PT Plan Current plan remains appropriate    Co-evaluation              AM-PAC  PT "6 Clicks" Mobility   Outcome Measure  Help needed turning from your back to your side while in a flat bed without using bedrails?: None Help needed moving from lying on your back to sitting on the side of a flat bed without using bedrails?: None Help needed moving to and from a bed to a chair (including a wheelchair)?: None Help needed standing up from a chair using your arms (e.g., wheelchair or bedside chair)?: None Help needed to walk in hospital room?: A Little Help needed climbing 3-5 steps with a railing? : A Little 6 Click Score: 22    End of Session Equipment Utilized During Treatment: Oxygen Activity Tolerance: Treatment limited secondary to medical complications (Comment) Patient left: in chair;with call bell/phone within reach Nurse Communication: Mobility status PT Visit Diagnosis: Other abnormalities of gait and mobility (R26.89);Muscle weakness (generalized) (M62.81)     Time: 1125-1207 PT Time Calculation (min) (ACUTE ONLY): 42 min  Charges:  $Therapeutic Activity: 38-52 mins                     Tresa Endo PT Acute Rehabilitation Services Pager [phone] Office [phone]    Claretha Cooper 11/27/2018, 4:18 PM

## 2018-11-27 NOTE — Evaluation (Signed)
Occupational Therapy Evaluation Patient Details Name: Shelley Allison MRN: [id number] DOB: 1955/12/06 Today's Date: 11/27/2018    History of Present Illness 63 y/o female pt w/ hx of HTN, obesity, cerebral artery aneurysm rupture presented to ED  09/28 w/ N/V/D w/ progressive SOB and fever starting 09/27. precipitating factor was attending a wedding. In Ed dx w/ SARS-CoV-2 and moved to ICU 09/30.   Clinical Impression   Pt admitted with above diagnoses, presenting with compromised cardiopulmonary status limiting activity tolerance for BADL. PTA pt living with mother and dtr, independent at baseline. At time of eval, she is overall supervision/min guard for BADL and OOB transfers. Pt easily desats with activity. On 4L [place] on arrival, needing up to 15L [place] to sustain transfer from bathroom back to bed. Pt difficult to obtain reading, multiple means tried per RN. Pt reading as low as 78% with ear probe (Still poor reading) when completing functional mobility back to bed. PT had abdominal breathing at this time and difficulty verbalizing. Once in bed, forehead probe reading 82-85% and ear probe reading mid 90%s. Cued for deep/slow breathing and sats improving to high 90s. RN ultimately titrated pt down to 8L [place] by end of session. Will recommend HHOT if pt continues to have activity tolerance difficulties, may progress without follow up. Will continue to follow per POC.    Follow Up Recommendations  Home health OT;Other (comment)(may progress without f/u)    Equipment Recommendations  None recommended by OT    Recommendations for Other Services       Precautions / Restrictions Precautions Precautions: Fall;Other (comment) Precaution Comments: desats with activity Restrictions Weight Bearing Restrictions: No      Mobility Bed Mobility               General bed mobility comments: up in chair  Transfers Overall transfer level: Needs assistance Equipment used: None Transfers: Sit  to/from Stand Sit to Stand: Supervision Stand pivot transfers: Supervision       General transfer comment: supervision for safety and line management    Balance Overall balance assessment: Mild deficits observed, not formally tested                                         ADL either performed or assessed with clinical judgement   ADL Overall ADL's : Needs assistance/impaired Eating/Feeding: Set up;Sitting   Grooming: Min guard;Standing Grooming Details (indicate cue type and reason): cues for ECS and paced breathing after using toilet to wash hands Upper Body Bathing: Set up;Sitting   Lower Body Bathing: Set up;Sit to/from stand;Sitting/lateral leans   Upper Body Dressing : Set up;Sitting   Lower Body Dressing: Set up;Sit to/from stand;Sitting/lateral leans   Toilet Transfer: Supervision/safety;Regular Glass blower/designer Details (indicate cue type and reason): used regular toilet in room, cues needed to pace breathing during BM Toileting- Clothing Manipulation and Hygiene: Set up;Sitting/lateral lean;Sit to/from stand   Tub/ Shower Transfer: Min guard;Shower seat;Ambulation   Functional mobility during ADLs: Min guard;Supervision/safety General ADL Comments: pt ltd by cardiopulmonary status decreasing activity tolerance     Vision   Vision Assessment?: No apparent visual deficits     Perception     Praxis      Pertinent Vitals/Pain Pain Assessment: No/denies pain     Hand Dominance     Extremity/Trunk Assessment Upper Extremity Assessment Upper Extremity Assessment: Overall WFL for tasks assessed  Lower Extremity Assessment Lower Extremity Assessment: Defer to PT evaluation       Communication Communication Communication: No difficulties   Cognition Arousal/Alertness: Awake/alert Behavior During Therapy: WFL for tasks assessed/performed Overall Cognitive Status: Within Functional Limits for tasks assessed                                  General Comments: with being SOB   General Comments       Exercises     Shoulder Instructions      Home Living Family/patient expects to be discharged to:: Private residence Living Arrangements: Children;Parent   Type of Home: Other(Comment)(town home) Home Access: Level entry     Home Layout: Two level Alternate Level Stairs-Number of Steps: 15 stairs   Bathroom Shower/Tub: Chief Strategy Officer: Standard     Home Equipment: Shower seat          Prior Functioning/Environment Level of Independence: Independent                 OT Problem List: Decreased strength;Decreased knowledge of use of DME or AE;Decreased activity tolerance;Cardiopulmonary status limiting activity      OT Treatment/Interventions:      OT Goals(Current goals can be found in the care plan section) Acute Rehab OT Goals Patient Stated Goal: go home OT Goal Formulation: With patient Time For Goal Achievement: 12/11/18 Potential to Achieve Goals: Good  OT Frequency:     Barriers to D/C:            Co-evaluation              AM-PAC OT "6 Clicks" Daily Activity     Outcome Measure Help from another person eating meals?: A Little Help from another person taking care of personal grooming?: A Little Help from another person toileting, which includes using toliet, bedpan, or urinal?: A Little Help from another person bathing (including washing, rinsing, drying)?: A Little Help from another person to put on and taking off regular upper body clothing?: A Little Help from another person to put on and taking off regular lower body clothing?: A Little 6 Click Score: 18   End of Session Equipment Utilized During Treatment: Oxygen Nurse Communication: Mobility status;Other (comment)(O2 sats)  Activity Tolerance: Patient tolerated treatment well Patient left: in bed;with call bell/phone within reach;with nursing/sitter in room  OT Visit Diagnosis:  Other abnormalities of gait and mobility (R26.89);Muscle weakness (generalized) (M62.81);Other (comment)(cardiopulmonary status limiting activity tolerance)                Time: 0488-8916 OT Time Calculation (min): 29 min Charges:  OT General Charges $OT Visit: 1 Visit OT Evaluation $OT Eval Low Complexity: 1 Low OT Treatments $Self Care/Home Management : 8-22 mins  Dalphine Handing, MSOT, OTR/L Behavioral Health OT/ Acute Relief OT [phone]   Dalphine Handing 11/27/2018, 4:47 PM

## 2018-11-27 NOTE — Progress Notes (Signed)
[place] TEAM 1 - Stepdown/ICU TEAM  Shelley Allison  VZD:[id number] DOB: Jul 04, 1955 DOA: 10/27/2018 PCP: Leeroy Bock, DO    Brief Narrative:  [phone] with a history of HTN and cerebral artery aneurysm rupture who presented to the ED 9/28 with 1 week of nausea vomiting and diarrhea followed by progressive shortness of breath with fever which began on 9/27.  She had attended a large wedding prior to the onset of her symptoms.  In the ED she was found to be hypoxic and confirmed to be SARS-CoV-2 positive.  Chest x-ray revealed bilateral infiltrates.  Significant Events: 9/21 approximate onset of GI symptoms 9/27 onset of pulmonary symptoms 9/28 admit  9/30 transfer to ICU 10/3 transfer back to PCU  COVID-19 specific Treatment: Remdesivir 9/28 >10/2 Solumedrol 9/28 > 10/7 Actemra 9/29 Convalescent plasma - patient declined  Subjective: Overall the patient appears to very slowly be making progress.  She does however continue to experience significant desaturations which are truly symptomatic when she attempts to exert herself.  At present she remains too high risk for discharge home due to her need for very high oxygen support when she exerts herself.  She denies chest pain nausea or vomiting.  She is in good spirits and feels that she is slowly improving.  Assessment & Plan:  COVID pneumonia - acute hypoxic respiratory failure CT angio chest negative for PE 10/3 -bilateral lower extremity venous duplex negative for DVT -continue to wean oxygen as able - ambulate - incentive spirometer - has completed Solu-Medrol, Remdesivir, and Actemra treatment - still not yet ready for discharge home due to high level requirement for oxygen support with exertion - net negative ~1500 last 24hrs w/ slight increase in crt -continue to diurese but may soon have to stop based upon renal function  HTN Blood pressure controlled  Acute kidney injury Creatinine peaked at 1.43 -continue to follow  intermittently with ongoing attempts to diurese  Recent Labs  Lab 11/21/18 0515 11/22/18 0254 11/23/18 0145 11/25/18 0358 11/27/18 0145  CREATININE 0.90 1.08* 1.26* 1.08* 1.16*    Transaminitis resolved - likely due to the virus itself +/- Remdesivir  History of ruptured cerebral artery aneurysm  DVT prophylaxis: Lovenox Code Status: FULL CODE Family Communication:  Disposition Plan: Discharge home when oxygen requirement allows -high risk for failure and immediate return if attempts made to discharge home today  Consultants:  PCCM  Antimicrobials:  Ceftriaxone 9/28 > 10/1  Azithromycin 9/28 > 9/29  Objective: Blood pressure 116/67, pulse 87, temperature 99.4 F (37.4 C), temperature source Oral, resp. rate 14, height 5\' 5"  (1.651 m), weight 72.6 kg, SpO2 91 %.  Intake/Output Summary (Last 24 hours) at 11/27/2018 0708 Last data filed at 11/26/2018 1926 Gross per 24 hour  Intake 240 ml  Output -  Net 240 ml   Filed Weights   11/26/18 0500 11/26/18 0507 11/27/18 0347  Weight: 68.6 kg 68.6 kg 72.6 kg    Examination: General: No acute respiratory distress  Lungs: fine crackles diffusely -improved air movement throughout Cardiovascular: RRR Abdomen: NT/ND, soft, BS+ Extremities: Trace edema bilateral lower extremities  CBC: Recent Labs  Lab 11/21/18 0515 11/22/18 0254  WBC 13.2* 9.9  NEUTROABS 11.8*  --   HGB 13.9 14.0  HCT 44.0 44.5  MCV 87.0 86.7  PLT 270 257   Basic Metabolic Panel: Recent Labs  Lab 11/23/18 0145 11/25/18 0358 11/27/18 0145  NA 138 137 140  K 3.7 4.2 4.1  CL 101 103 103  CO2 25 26 26   GLUCOSE 111* 93 109*  BUN 42* 27* 22  CREATININE 1.26* 1.08* 1.16*  CALCIUM 8.2* 8.0* 8.3*   GFR: Estimated Creatinine Clearance: 49.5 mL/min (A) (by C-G formula based on SCr of 1.16 mg/dL (H)).  Liver Function Tests: Recent Labs  Lab 11/21/18 0515 11/22/18 0254 11/25/18 0358  AST 27 21 24   ALT 68* 56* 39  ALKPHOS 90 82 63  BILITOT  1.1 1.1 0.9  PROT 6.3* 6.2* 5.9*  ALBUMIN 2.9* 3.0* 2.7*    HbA1C: Hemoglobin A1C  Date/Time Value Ref Range Status  05/15/2015 03:52 PM 5.5  Final  04/25/2014 04:43 PM 5.8  Final    Scheduled Meds: . Chlorhexidine Gluconate Cloth  6 each Topical Daily  . enoxaparin (LOVENOX) injection  50 mg Subcutaneous Q24H  . feeding supplement (ENSURE ENLIVE)  237 mL Oral BID BM  . furosemide  60 mg Intravenous Q12H  . lisinopril  10 mg Oral Daily  . metoprolol tartrate  50 mg Oral BID     LOS: 15 days   Cherene Altes, MD [facility] Office  [phone] Pager - Text Page per Amion  If 7PM-7AM, please contact night-coverage per Amion 11/27/2018, 7:08 AM

## 2018-11-28 LAB — BASIC METABOLIC PANEL
Anion gap: 10 (ref 5–15)
BUN: 40 mg/dL — ABNORMAL HIGH (ref 8–23)
CO2: 28 mmol/L (ref 22–32)
Calcium: 8.7 mg/dL — ABNORMAL LOW (ref 8.9–10.3)
Chloride: 102 mmol/L (ref 98–111)
Creatinine, Ser: 1.66 mg/dL — ABNORMAL HIGH (ref 0.44–1.00)
GFR calc Af Amer: 38 mL/min — ABNORMAL LOW (ref >60)
GFR calc non Af Amer: 32 mL/min — ABNORMAL LOW (ref >60)
Glucose, Bld: 105 mg/dL — ABNORMAL HIGH (ref 70–99)
Potassium: 4.3 mmol/L (ref 3.5–5.1)
Sodium: 140 mmol/L (ref 135–145)

## 2018-11-28 MED ORDER — ALBUTEROL SULFATE HFA 108 (90 BASE) MCG/ACT IN AERS
1.0000 | INHALATION_SPRAY | Freq: Four times a day (QID) | RESPIRATORY_TRACT | Status: DC
  Administered 2018-11-28 – 2018-12-01 (×12): 2 via RESPIRATORY_TRACT
  Filled 2018-11-28: qty 6.7

## 2018-11-28 NOTE — Progress Notes (Signed)
PROGRESS NOTE  Shelley Allison DOB: 1955/11/26 DOA: 10/27/2018 PCP: Richarda Osmond, DO   LOS: 16 days   Brief Narrative / Interim history: 63yo with a history of HTN and cerebral artery aneurysm rupture who presented to the ED 9/28 with 1 week of nausea vomiting and diarrhea followed by progressive shortness of breath with fever which began on 9/27.  She had attended a large wedding prior to the onset of her symptoms.  In the ED she was found to be hypoxic and confirmed to be SARS-CoV-2 positive.  Chest x-ray revealed bilateral infiltrates.  Significant Events: 9/21 approximate onset of GI symptoms 9/27 onset of pulmonary symptoms 9/28 admit  9/30 transfer to ICU 10/3 transfer back to PCU  COVID-19 specific Treatment: Remdesivir 9/28 >10/2 Solumedrol 9/28 > 10/7 Actemra 9/29 Convalescent plasma - patient declined  Subjective / 24h Interval events: Overall feels improved this morning but still quite dyspneic with minimal activity, slow to progress. Unstable for home d/c   Assessment & Plan: Principal Problem:   Respiratory tract infection due to COVID-19 virus Active Problems:   OBESITY, NOS   HYPERTENSION, BENIGN SYSTEMIC   CEREBRAL ANEURYSM, RUPTURED   Pneumonia due to COVID-19 virus   Acute respiratory failure with hypoxia (HCC)   Acute pulmonary embolus (HCC)   Essential hypertension   AKI (acute kidney injury) ([place])   Morbid obesity with BMI of 70 and over, adult ([place])   CAP (community acquired pneumonia)   Principal Problem Acute hypoxic respiratory failure -CT angio negative for PE 10/3, bilateral LE negative for DVT -wean off oxygen as able, continue IS, flutter valve -completed steroids, Remdesivir, Actemra   Active Problems HTN -BP controlled  AKI -Cr worse today at 1.6, hold Lasix and Lisinopril, monitor Cr  Transaminitis  -LFTs normalized  History of ruptured cerebral artery aneurysm  -stable   Scheduled Meds: . albuterol   1-2 puff Inhalation Q6H  . Chlorhexidine Gluconate Cloth  6 each Topical Daily  . enoxaparin (LOVENOX) injection  50 mg Subcutaneous Q24H  . feeding supplement (ENSURE ENLIVE)  237 mL Oral BID BM  . metoprolol tartrate  50 mg Oral BID   Continuous Infusions: PRN Meds:.acetaminophen, chlorpheniramine-HYDROcodone, guaiFENesin-dextromethorphan, hydrALAZINE, ondansetron **OR** ondansetron (ZOFRAN) IV, oxyCODONE, sodium chloride, zolpidem  DVT prophylaxis: Lovenox  Code Status: Full code Family Communication: d/w patient  Disposition Plan: home when stable   Consultants:  None   Procedures:  None   Microbiology  None   Antimicrobials: None    Objective: Vitals:   11/28/18 0407 11/28/18 0730 11/28/18 1046 11/28/18 1300  BP:  108/79    Pulse:  92 94 76  Resp:      Temp:  99 F (37.2 C)    TempSrc:  Oral    SpO2: 95% 93%  95%  Weight:      Height:        Intake/Output Summary (Last 24 hours) at 11/28/2018 1340 Last data filed at 11/28/2018 1324 Gross per 24 hour  Intake 1200 ml  Output 150 ml  Net 1050 ml   Filed Weights   11/26/18 0507 11/27/18 0347 11/28/18 0404  Weight: 68.6 kg 72.6 kg 87.5 kg    Examination: Constitutional: NAD Eyes: PERRL ENMT: Mucous membranes are moist. No oropharyngeal exudates Neck: normal, supple Respiratory: clear to auscultation bilaterally, no wheezing, no crackles. Normal respiratory effort. Cardiovascular: Regular rate and rhythm, no murmurs / rubs / gallops. No LE edema Abdomen: no tenderness. Bowel sounds positive.  Musculoskeletal: no  clubbing / cyanosis.  Skin: no rashes Neurologic: CN 2-12 grossly intact. Strength 5/5 in all 4.  Psychiatric: Normal judgment and insight. Alert and oriented x 3. Normal mood.   Data Reviewed: I have independently reviewed following labs and imaging studies   CBC: Recent Labs  Lab 11/22/18 0254  WBC 9.9  HGB 14.0  HCT 44.5  MCV 86.7  PLT 257   Basic Metabolic Panel: Recent Labs   Lab 11/22/18 0254 11/23/18 0145 11/25/18 0358 11/27/18 0145 11/28/18 0055  NA 137 138 137 140 140  K 4.5 3.7 4.2 4.1 4.3  CL 98 101 103 103 102  CO2 25 25 26 26 28   GLUCOSE 126* 111* 93 109* 105*  BUN 38* 42* 27* 22 40*  CREATININE 1.08* 1.26* 1.08* 1.16* 1.66*  CALCIUM 8.5* 8.2* 8.0* 8.3* 8.7*   GFR: Estimated Creatinine Clearance: 37.9 mL/min (A) (by C-G formula based on SCr of 1.66 mg/dL (H)). Liver Function Tests: Recent Labs  Lab 11/22/18 0254 11/25/18 0358  AST 21 24  ALT 56* 39  ALKPHOS 82 63  BILITOT 1.1 0.9  PROT 6.2* 5.9*  ALBUMIN 3.0* 2.7*   No results for input(s): LIPASE, AMYLASE in the last 168 hours. No results for input(s): AMMONIA in the last 168 hours. Coagulation Profile: No results for input(s): INR, PROTIME in the last 168 hours. Cardiac Enzymes: No results for input(s): CKTOTAL, CKMB, CKMBINDEX, TROPONINI in the last 168 hours. BNP (last 3 results) No results for input(s): PROBNP in the last 8760 hours. HbA1C: No results for input(s): HGBA1C in the last 72 hours. CBG: No results for input(s): GLUCAP in the last 168 hours. Lipid Profile: No results for input(s): CHOL, HDL, LDLCALC, TRIG, CHOLHDL, LDLDIRECT in the last 72 hours. Thyroid Function Tests: No results for input(s): TSH, T4TOTAL, FREET4, T3FREE, THYROIDAB in the last 72 hours. Anemia Panel: No results for input(s): VITAMINB12, FOLATE, FERRITIN, TIBC, IRON, RETICCTPCT in the last 72 hours. Urine analysis:    Component Value Date/Time   COLORURINE YELLOW 11/15/2018 2215   APPEARANCEUR HAZY (A) 11/15/2018 2215   LABSPEC 1.018 11/15/2018 2215   PHURINE 6.0 11/15/2018 2215   GLUCOSEU NEGATIVE 11/15/2018 2215   HGBUR NEGATIVE 11/15/2018 2215   HGBUR trace-intact 11/14/2006 0829   BILIRUBINUR NEGATIVE 11/15/2018 2215   KETONESUR NEGATIVE 11/15/2018 2215   PROTEINUR NEGATIVE 11/15/2018 2215   UROBILINOGEN 1.0 04/06/2014 1816   NITRITE NEGATIVE 11/15/2018 2215   LEUKOCYTESUR  NEGATIVE 11/15/2018 2215   Sepsis Labs: Invalid input(s): PROCALCITONIN, LACTICIDVEN  No results found for this or any previous visit (from the past 240 hour(s)).    Radiology Studies: No results found.  2216, MD, PhD [facility]  Contact via  [email or web address]  [facility] Office Info P: [phone] F: [phone]

## 2018-11-29 LAB — COMPREHENSIVE METABOLIC PANEL
ALT: 41 U/L (ref 0–44)
AST: 26 U/L (ref 15–41)
Albumin: 2.7 g/dL — ABNORMAL LOW (ref 3.5–5.0)
Alkaline Phosphatase: 67 U/L (ref 38–126)
Anion gap: 11 (ref 5–15)
BUN: 51 mg/dL — ABNORMAL HIGH (ref 8–23)
CO2: 27 mmol/L (ref 22–32)
Calcium: 8.7 mg/dL — ABNORMAL LOW (ref 8.9–10.3)
Chloride: 100 mmol/L (ref 98–111)
Creatinine, Ser: 1.78 mg/dL — ABNORMAL HIGH (ref 0.44–1.00)
GFR calc Af Amer: 35 mL/min — ABNORMAL LOW (ref >60)
GFR calc non Af Amer: 30 mL/min — ABNORMAL LOW (ref >60)
Glucose, Bld: 108 mg/dL — ABNORMAL HIGH (ref 70–99)
Potassium: 4 mmol/L (ref 3.5–5.1)
Sodium: 138 mmol/L (ref 135–145)
Total Bilirubin: 0.8 mg/dL (ref 0.3–1.2)
Total Protein: 6.1 g/dL — ABNORMAL LOW (ref 6.5–8.1)

## 2018-11-29 LAB — CBC
HCT: 39.5 % (ref 36.0–46.0)
Hemoglobin: 12.1 g/dL (ref 12.0–15.0)
MCH: 27.6 pg (ref 26.0–34.0)
MCHC: 30.6 g/dL (ref 30.0–36.0)
MCV: 90.2 fL (ref 80.0–100.0)
Platelets: 255 10*3/uL (ref 150–400)
RBC: 4.38 MIL/uL (ref 3.87–5.11)
RDW: 14.4 % (ref 11.5–15.5)
WBC: 10.3 10*3/uL (ref 4.0–10.5)
nRBC: 0 % (ref 0.0–0.2)

## 2018-11-29 NOTE — Progress Notes (Signed)
Pt spoke with daughter this afternoon while I was in the room. She stated she had no questions or concerns to address with me & spoke to her mother.  Pt had BM today. Remained on 5LO2/HFNC VSS-afebrile No c/o pain Pt worked w/ PT & ambulated around the room Pt used IS today w/ a goal of 500.

## 2018-11-29 NOTE — Progress Notes (Signed)
Physical Therapy Treatment Patient Details Name: Shelley Allison MRN: [id number] DOB: 08/08/1955 Today's Date: 11/29/2018    History of Present Illness 63 y/o female pt w/ hx of HTN, obesity, cerebral artery aneurysm rupture presented to ED  09/28 w/ N/V/D w/ progressive SOB and fever starting 09/27. precipitating factor was attending a wedding. In Ed dx w/ SARS-CoV-2 and moved to ICU 09/30.    PT Comments    Pt continues to be limited by 02 saturation, she is able to complete most tasks with SBA- Mod I but desats to 70s/60s after  Activity. Pt also has great difficulty with pursed lip breathing and also taking deep breaths, with cues and education continues to take shallow breaths and not complete pursed lip breathing correctly. Also used this session to reinforce use of IS and also flutter valve, pt also has great difficulty with these. D/C recommendation updated as pt lives home with mother, who also has recently been hospitalized w/ COVID, and may need more assistance than is available at home currently.   Follow Up Recommendations  SNF(may need to stay in SNF at d/c as mother is also sick)     Equipment Recommendations       Recommendations for Other Services       Precautions / Restrictions Precautions Precautions: Fall Precaution Comments: desats with activity Restrictions Weight Bearing Restrictions: No    Mobility  Bed Mobility Overal bed mobility: Modified Independent                Transfers Overall transfer level: Modified independent Equipment used: None Transfers: Sit to/from [facility] Sit to Stand: Modified independent (Device/Increase time) Stand pivot transfers: Modified independent (Device/Increase time)       General transfer comment: needs a w/ vital sign monitoring but functionally does all tasks on her own  Ambulation/Gait Ambulation/Gait assistance: Supervision Gait Distance (Feet): 34 Feet Assistive device: None Gait  Pattern/deviations: Step-through pattern     General Gait Details: able to ambulate around room approx 70ft with SBA and line manaement, pt on 8L/min via HFNC and read on earlobe probe. Pt desat to 59s after sitting down and cues needed for pursed lip breathing, pt does have difficulty with this but able to recover within 3 mins to 88%   Stairs             Wheelchair Mobility    Modified Rankin (Stroke Patients Only)       Balance Overall balance assessment: Mild deficits observed, not formally tested                                          Cognition Arousal/Alertness: Awake/alert Behavior During Therapy: WFL for tasks assessed/performed Overall Cognitive Status: Within Functional Limits for tasks assessed                                        Exercises Other Exercises Other Exercises: use of IS and flutter valve x 5 good reps each    General Comments General comments (skin integrity, edema, etc.): Pt does functionally better in this session. with bed mob and and transfer bed to recliner on 7L/min via HFNC pt desat to 70s, with seated rest and cues for pursed lip breathing pt able to recover to high 80s. Pt able to  ambulate around room with SBA and line management but once seated again desat this time to 60s, pt was on 8L/min via HFNC. Pt has significant difficulty with pursed lip breathing and even with continued cues does not seem to be able to adjust breathing. pt requested to use Shelley Allison but therapist insisted on waiting till sats in high 26s. once at 88% able to transfer to commode and remain in low 80s during this. ocnce completed needed set up to complete clean up, after this once again pt desat to 70s. In recliner worked on pursed lip breathing once more with similar results. Had pt use IS and also flutter valve and explained in detail importance of using each as prescribed. Pt has great difficulty with both and gives up very easily stating  'thats enough for today'. Once again therapist extensively educated pt on why she needs to use flutter valve and IS as instructed.      Pertinent Vitals/Pain Pain Assessment: No/denies pain    Home Living                      Prior Function            PT Goals (current goals can now be found in the care plan section) Acute Rehab PT Goals Time For Goal Achievement: 12/05/18 Potential to Achieve Goals: Fair Progress towards PT goals: Not progressing toward goals - comment(02 saturation limiting progression)    Frequency    Min 3X/week      PT Plan Discharge plan needs to be updated    Co-evaluation              AM-PAC PT "6 Clicks" Mobility   Outcome Measure  Help needed turning from your back to your side while in a flat bed without using bedrails?: None Help needed moving from lying on your back to sitting on the side of a flat bed without using bedrails?: None Help needed moving to and from a bed to a chair (including a wheelchair)?: A Little Help needed standing up from a chair using your arms (e.g., wheelchair or bedside chair)?: A Little Help needed to walk in hospital room?: A Little Help needed climbing 3-5 steps with a railing? : A Lot 6 Click Score: 19    End of Session Equipment Utilized During Treatment: Oxygen Activity Tolerance: Treatment limited secondary to medical complications (Comment) Patient left: in chair;with call bell/phone within reach Nurse Communication: Mobility status PT Visit Diagnosis: Other abnormalities of gait and mobility (R26.89);Muscle weakness (generalized) (M62.81)     Time: 2595-6387 PT Time Calculation (min) (ACUTE ONLY): 36 min  Charges:  $Gait Training: 8-22 mins $Therapeutic Activity: 8-22 mins                     Drema Pry, PT    Freddi Starr 11/29/2018, 2:52 PM

## 2018-11-29 NOTE — Progress Notes (Signed)
PROGRESS NOTE  Shelley Allison:[id number] DOB: Feb 23, 1955 DOA: 11/07/2018 PCP: Leeroy Bock, DO   LOS: 17 days   Brief Narrative / Interim history: 63yo with a history of HTN and cerebral artery aneurysm rupture who presented to the ED 9/28 with 1 week of nausea vomiting and diarrhea followed by progressive shortness of breath with fever which began on 9/27.  She had attended a large wedding prior to the onset of her symptoms.  In the ED she was found to be hypoxic and confirmed to be SARS-CoV-2 positive.  Chest x-ray revealed bilateral infiltrates.  Significant Events: 9/21 approximate onset of GI symptoms 9/27 onset of pulmonary symptoms 9/28 admit  9/30 transfer to ICU 10/3 transfer back to PCU  COVID-19 specific Treatment: Remdesivir 9/28 >10/2 Solumedrol 9/28 > 10/7 Actemra 9/29 Convalescent plasma - patient declined   Subjective / 24h Interval events: Overall feels about the same.  She is able to ambulate in the room to the bathroom and back with supplemental oxygen, continues to feel significantly dyspneic as well as lightheaded when she does that.  Assessment & Plan: Principal Problem:   Respiratory tract infection due to COVID-19 virus Active Problems:   OBESITY, NOS   HYPERTENSION, BENIGN SYSTEMIC   CEREBRAL ANEURYSM, RUPTURED   Pneumonia due to COVID-19 virus   Acute respiratory failure with hypoxia (HCC)   Acute pulmonary embolus (HCC)   Essential hypertension   AKI (acute kidney injury) (HCC)   Morbid obesity with BMI of 70 and over, adult (HCC)   CAP (community acquired pneumonia)   Principal Problem Acute hypoxic respiratory failure -CT angio negative for PE 10/3, bilateral LE negative for DVT -wean off oxygen as able, continue IS, flutter valve, inhalers -Completed steroids, Remdesivir, Actemra  Active Problems HTN -BP controlled, continue metoprolol  AKI -Likely due to diuresis, creatinine overall stable at 1.7.  Hold Lasix.  Appears  euvolemic.  Blood pressure is stable  Transaminitis  -LFTs normalized  History of ruptured cerebral artery aneurysm  -stable   Scheduled Meds: . albuterol  1-2 puff Inhalation Q6H  . Chlorhexidine Gluconate Cloth  6 each Topical Daily  . enoxaparin (LOVENOX) injection  50 mg Subcutaneous Q24H  . feeding supplement (ENSURE ENLIVE)  237 mL Oral BID BM  . metoprolol tartrate  50 mg Oral BID   Continuous Infusions: PRN Meds:.acetaminophen, chlorpheniramine-HYDROcodone, guaiFENesin-dextromethorphan, hydrALAZINE, ondansetron **OR** ondansetron (ZOFRAN) IV, oxyCODONE, sodium chloride, zolpidem  DVT prophylaxis: Lovenox  Code Status: Full code Family Communication: d/w Alvy Beal, daughter, [phone] Disposition Plan: home when stable   Consultants:  None   Procedures:  None   Microbiology  None   Antimicrobials: None    Objective: Vitals:   11/29/18 0823 11/29/18 0900 11/29/18 1055 11/29/18 1100  BP: 133/81  119/85 119/85  Pulse: (!) 115 (!) 111 (!) 108 (!) 106  Resp:      Temp: 99.5 F (37.5 C)     TempSrc: Oral     SpO2: 90% 94%  93%  Weight:      Height:        Intake/Output Summary (Last 24 hours) at 11/29/2018 1149 Last data filed at 11/29/2018 1144 Gross per 24 hour  Intake 1320 ml  Output 400 ml  Net 920 ml   Filed Weights   11/27/18 0347 11/28/18 0404 11/29/18 0430  Weight: 72.6 kg 87.5 kg 87.7 kg    Examination: Constitutional: Sitting in bed, no distress, about to eat breakfast Eyes: No scleral icterus ENMT: Moist mucous membranes  Neck: normal, supple Respiratory: Overall clear but slightly diminished at the bases, no wheezing or crackles Cardiovascular: Regular rate and rhythm, no murmurs.  No edema Abdomen: Soft, NT, ND, positive bowel sounds Musculoskeletal: no clubbing / cyanosis.  Skin: No rashes seen Neurologic: No focal deficits, equal strength Psychiatric: Normal judgment and insight. Alert and oriented x 3. Normal mood.   Data  Reviewed: I have independently reviewed following labs and imaging studies   CBC: Recent Labs  Lab 11/29/18 0040  WBC 10.3  HGB 12.1  HCT 39.5  MCV 90.2  PLT 681   Basic Metabolic Panel: Recent Labs  Lab 11/23/18 0145 11/25/18 0358 11/27/18 0145 11/28/18 0055 11/29/18 0040  NA 138 137 140 140 138  K 3.7 4.2 4.1 4.3 4.0  CL 101 103 103 102 100  CO2 25 26 26 28 27   GLUCOSE 111* 93 109* 105* 108*  BUN 42* 27* 22 40* 51*  CREATININE 1.26* 1.08* 1.16* 1.66* 1.78*  CALCIUM 8.2* 8.0* 8.3* 8.7* 8.7*   GFR: Estimated Creatinine Clearance: 35.4 mL/min (A) (by C-G formula based on SCr of 1.78 mg/dL (H)). Liver Function Tests: Recent Labs  Lab 11/25/18 0358 11/29/18 0040  AST 24 26  ALT 39 41  ALKPHOS 63 67  BILITOT 0.9 0.8  PROT 5.9* 6.1*  ALBUMIN 2.7* 2.7*   No results for input(s): LIPASE, AMYLASE in the last 168 hours. No results for input(s): AMMONIA in the last 168 hours. Coagulation Profile: No results for input(s): INR, PROTIME in the last 168 hours. Cardiac Enzymes: No results for input(s): CKTOTAL, CKMB, CKMBINDEX, TROPONINI in the last 168 hours. BNP (last 3 results) No results for input(s): PROBNP in the last 8760 hours. HbA1C: No results for input(s): HGBA1C in the last 72 hours. CBG: No results for input(s): GLUCAP in the last 168 hours. Lipid Profile: No results for input(s): CHOL, HDL, LDLCALC, TRIG, CHOLHDL, LDLDIRECT in the last 72 hours. Thyroid Function Tests: No results for input(s): TSH, T4TOTAL, FREET4, T3FREE, THYROIDAB in the last 72 hours. Anemia Panel: No results for input(s): VITAMINB12, FOLATE, FERRITIN, TIBC, IRON, RETICCTPCT in the last 72 hours. Urine analysis:    Component Value Date/Time   COLORURINE YELLOW 11/15/2018 2215   APPEARANCEUR HAZY (A) 11/15/2018 2215   LABSPEC 1.018 11/15/2018 2215   PHURINE 6.0 11/15/2018 2215   GLUCOSEU NEGATIVE 11/15/2018 2215   HGBUR NEGATIVE 11/15/2018 2215   HGBUR trace-intact 11/14/2006  0829   [place] 11/15/2018 2215   [place] 11/15/2018 2215   PROTEINUR NEGATIVE 11/15/2018 2215   UROBILINOGEN 1.0 04/06/2014 1816   NITRITE NEGATIVE 11/15/2018 2215   LEUKOCYTESUR NEGATIVE 11/15/2018 2215   Sepsis Labs: Invalid input(s): PROCALCITONIN, LACTICIDVEN  No results found for this or any previous visit (from the past 240 hour(s)).   Radiology Studies: No results found.  Marzetta Board, MD, PhD [facility]  Contact via  [email or web address]  [place] P: [phone] F: [phone]

## 2018-11-30 ENCOUNTER — Inpatient Hospital Stay (HOSPITAL_COMMUNITY): Payer: BC Managed Care – PPO

## 2018-11-30 DIAGNOSIS — U071 COVID-19: Secondary | ICD-10-CM

## 2018-11-30 DIAGNOSIS — J069 Acute upper respiratory infection, unspecified: Secondary | ICD-10-CM

## 2018-11-30 DIAGNOSIS — R0602 Shortness of breath: Secondary | ICD-10-CM

## 2018-11-30 DIAGNOSIS — J988 Other specified respiratory disorders: Secondary | ICD-10-CM

## 2018-11-30 LAB — BASIC METABOLIC PANEL
Anion gap: 11 (ref 5–15)
BUN: 58 mg/dL — ABNORMAL HIGH (ref 8–23)
CO2: 29 mmol/L (ref 22–32)
Calcium: 9.1 mg/dL (ref 8.9–10.3)
Chloride: 100 mmol/L (ref 98–111)
Creatinine, Ser: 1.47 mg/dL — ABNORMAL HIGH (ref 0.44–1.00)
GFR calc Af Amer: 44 mL/min — ABNORMAL LOW (ref >60)
GFR calc non Af Amer: 38 mL/min — ABNORMAL LOW (ref >60)
Glucose, Bld: 141 mg/dL — ABNORMAL HIGH (ref 70–99)
Potassium: 4.1 mmol/L (ref 3.5–5.1)
Sodium: 140 mmol/L (ref 135–145)

## 2018-11-30 LAB — ECHOCARDIOGRAM COMPLETE
Height: 65 in
Weight: 3100.55 oz

## 2018-11-30 MED ORDER — METHYLPREDNISOLONE SODIUM SUCC 40 MG IJ SOLR
40.0000 mg | Freq: Two times a day (BID) | INTRAMUSCULAR | Status: DC
  Administered 2018-11-30 – 2018-12-02 (×4): 40 mg via INTRAVENOUS
  Filled 2018-11-30 (×5): qty 1

## 2018-11-30 MED ORDER — ENSURE ENLIVE PO LIQD
237.0000 mL | Freq: Three times a day (TID) | ORAL | Status: DC
  Administered 2018-11-30 – 2018-12-01 (×3): 237 mL via ORAL
  Filled 2018-11-30 (×6): qty 237

## 2018-11-30 NOTE — Progress Notes (Signed)
PROGRESS NOTE  Shelley Allison XBD:[id number] DOB: 20-Oct-1955 DOA: 2018/11/18 PCP: Richarda Osmond, DO   LOS: 18 days   Brief Narrative / Interim history: 63yo with a history of HTN and cerebral artery aneurysm rupture who presented to the ED 9/28 with 1 week of nausea vomiting and diarrhea followed by progressive shortness of breath with fever which began on 9/27.  She had attended a large wedding prior to the onset of her symptoms.  In the ED she was found to be hypoxic and confirmed to be SARS-CoV-2 positive.  Chest x-ray revealed bilateral infiltrates.  Hospital course complicated by persistent hypoxia  Significant Events: 9/21 approximate onset of GI symptoms 9/27 onset of pulmonary symptoms 9/28 admit  9/30 transfer to ICU 10/3 transfer back to PCU  COVID-19 specific Treatment: Remdesivir 9/28 >10/2 Solumedrol 9/28 > 10/7 Actemra 9/29 Convalescent plasma - patient declined   Subjective / 24h Interval events: Had an episode this morning of the setting into the 60s while moving just a few feet, became lightheaded and short of breath.  On my evaluation she denies any shortness of breath, denies any chest pain, denies any palpitations.  She is visibly tachypneic while talking to me  Assessment & Plan: Principal Problem:   Respiratory tract infection due to COVID-19 virus Active Problems:   OBESITY, NOS   HYPERTENSION, BENIGN SYSTEMIC   CEREBRAL ANEURYSM, RUPTURED   Pneumonia due to COVID-19 virus   Acute respiratory failure with hypoxia (HCC)   Acute pulmonary embolus (HCC)   Essential hypertension   AKI (acute kidney injury) ([place])   Morbid obesity with BMI of 70 and over, adult ([place])   CAP (community acquired pneumonia)   Principal Problem Acute hypoxic respiratory failure -CT angio negative for PE 10/3, bilateral LE negative for DVT -wean off oxygen as able, continue IS, flutter valve, inhalers -Completed steroids, Remdesivir, Actemra -She has persistent hypoxia  with very little reserves.  She is fairly stable at rest but had to be placed back up on 15 L high flow nasal cannula with just walking a few feet and desatted into the 60s.  I will repeat a chest x-ray.  I have consulted pulmonology, discussed with Dr. Lake Bells, appreciate input  Active Problems HTN -Continue metoprolol  AKI -Likely due to diuresis, creatinine peaked to 1.78 and now gradually improving.  She is net -3.5 L.  Creatinine this morning 1.47  Transaminitis  -LFTs normalized  History of ruptured cerebral artery aneurysm  -stable   Scheduled Meds: . albuterol  1-2 puff Inhalation Q6H  . Chlorhexidine Gluconate Cloth  6 each Topical Daily  . enoxaparin (LOVENOX) injection  50 mg Subcutaneous Q24H  . feeding supplement (ENSURE ENLIVE)  237 mL Oral BID BM  . metoprolol tartrate  50 mg Oral BID   Continuous Infusions: PRN Meds:.acetaminophen, chlorpheniramine-HYDROcodone, guaiFENesin-dextromethorphan, hydrALAZINE, ondansetron **OR** ondansetron (ZOFRAN) IV, oxyCODONE, sodium chloride, zolpidem  DVT prophylaxis: Lovenox  Code Status: Full code Family Communication: d/w Ellison Hughs, daughter, [phone] Disposition Plan: home when stable   Consultants:  PCCM   Procedures:  None   Microbiology  None   Antimicrobials: None    Objective: Vitals:   11/30/18 0732 11/30/18 0737 11/30/18 0836 11/30/18 0849  BP: (!) 161/86 (!) 161/86    Pulse: (!) 109 (!) 104    Resp:  18 (!) 26   Temp:  98.1 F (36.7 C)    TempSrc:  Oral    SpO2: 92% 90%  90%  Weight:  Height:        Intake/Output Summary (Last 24 hours) at 11/30/2018 1107 Last data filed at 11/30/2018 0941 Gross per 24 hour  Intake 600 ml  Output -  Net 600 ml   Filed Weights   11/28/18 0404 11/29/18 0430 11/30/18 0500  Weight: 87.5 kg 87.7 kg 87.9 kg    Examination: Constitutional: Seen just after she walked, she is in bed, quite tachypneic Eyes: No scleral icterus ENMT: Moist mucous membranes  Neck: normal, supple Respiratory: Diminished at the bases, overall clear, no wheezing Cardiovascular: Regular rate and rhythm, no murmurs appreciated.  No peripheral edema Abdomen: Soft, nontender, nondistended, positive bowel sounds Musculoskeletal: no clubbing / cyanosis.  Skin: No rashes seen Neurologic: No focal deficits, equal strength Psychiatric: Normal judgment and insight. Alert and oriented x 3. Normal mood.   Data Reviewed: I have independently reviewed following labs and imaging studies   CBC: Recent Labs  Lab 11/29/18 0040  WBC 10.3  HGB 12.1  HCT 39.5  MCV 90.2  PLT 255   Basic Metabolic Panel: Recent Labs  Lab 11/25/18 0358 11/27/18 0145 11/28/18 0055 11/29/18 0040 11/30/18 0122  NA 137 140 140 138 140  K 4.2 4.1 4.3 4.0 4.1  CL 103 103 102 100 100  CO2 26 26 28 27 29   GLUCOSE 93 109* 105* 108* 141*  BUN 27* 22 40* 51* 58*  CREATININE 1.08* 1.16* 1.66* 1.78* 1.47*  CALCIUM 8.0* 8.3* 8.7* 8.7* 9.1   GFR: Estimated Creatinine Clearance: 42.9 mL/min (A) (by C-G formula based on SCr of 1.47 mg/dL (H)). Liver Function Tests: Recent Labs  Lab 11/25/18 0358 11/29/18 0040  AST 24 26  ALT 39 41  ALKPHOS 63 67  BILITOT 0.9 0.8  PROT 5.9* 6.1*  ALBUMIN 2.7* 2.7*   No results for input(s): LIPASE, AMYLASE in the last 168 hours. No results for input(s): AMMONIA in the last 168 hours. Coagulation Profile: No results for input(s): INR, PROTIME in the last 168 hours. Cardiac Enzymes: No results for input(s): CKTOTAL, CKMB, CKMBINDEX, TROPONINI in the last 168 hours. BNP (last 3 results) No results for input(s): PROBNP in the last 8760 hours. HbA1C: No results for input(s): HGBA1C in the last 72 hours. CBG: No results for input(s): GLUCAP in the last 168 hours. Lipid Profile: No results for input(s): CHOL, HDL, LDLCALC, TRIG, CHOLHDL, LDLDIRECT in the last 72 hours. Thyroid Function Tests: No results for input(s): TSH, T4TOTAL, FREET4, T3FREE,  THYROIDAB in the last 72 hours. Anemia Panel: No results for input(s): VITAMINB12, FOLATE, FERRITIN, TIBC, IRON, RETICCTPCT in the last 72 hours. Urine analysis:    Component Value Date/Time   COLORURINE YELLOW 11/15/2018 2215   APPEARANCEUR HAZY (A) 11/15/2018 2215   LABSPEC 1.018 11/15/2018 2215   PHURINE 6.0 11/15/2018 2215   GLUCOSEU NEGATIVE 11/15/2018 2215   HGBUR NEGATIVE 11/15/2018 2215   HGBUR trace-intact 11/14/2006 0829   BILIRUBINUR NEGATIVE 11/15/2018 2215   KETONESUR NEGATIVE 11/15/2018 2215   PROTEINUR NEGATIVE 11/15/2018 2215   UROBILINOGEN 1.0 04/06/2014 1816   NITRITE NEGATIVE 11/15/2018 2215   LEUKOCYTESUR NEGATIVE 11/15/2018 2215   Sepsis Labs: Invalid input(s): PROCALCITONIN, LACTICIDVEN  No results found for this or any previous visit (from the past 240 hour(s)).   Radiology Studies: No results found.  2216, MD, PhD [facility]  Contact via  [email or web address]  [facility] Office Info P: [phone] F: [phone]

## 2018-11-30 NOTE — Progress Notes (Signed)
Nutrition Follow-up RD working remotely.  DOCUMENTATION CODES:   Not applicable  INTERVENTION:    Continue Ensure Enlive, increase to TID, each supplement provides 350 kcal and 20 grams of protein   Continue MVI daily  Continue Hormel Shake daily with Breakfast which provides 520 kcals and 22 g of protein and Magic cup BID with lunch and dinner, each supplement provides 290 kcal and 9 grams of protein, automatically on meal trays to optimize nutritional intake.  NUTRITION DIAGNOSIS:   Increased nutrient needs related to acute illness as evidenced by estimated needs.  Ongoing   GOAL:   Patient will meet greater than or equal to 90% of their needs  Progressing  MONITOR:   PO intake, Supplement acceptance  ASSESSMENT:   63 yo female admitted with N/V/D and SOB which began on 9/27 after attending a big wedding. COVID-19 positive in the ED. PMH includes HTN, cerebral artery aneurysm rupture.   Labs reviewed. Medications reviewed.   Since Monday, patient has consumed </= 50% of meals. She is being offered Ensure Enlive BID.   Patient with continued hypoxia; oxygen desats into the 60's with activity requiring 15 L HFNC.  NUTRITION - FOCUSED PHYSICAL EXAM:  deferred  Diet Order:   Diet Order            Diet regular Room service appropriate? Yes; Fluid consistency: Thin  Diet effective now              EDUCATION NEEDS:   Not appropriate for education at this time  Skin:  Skin Assessment: Reviewed RN Assessment  Last BM:  10/15  Height:   Ht Readings from Last 1 Encounters:  11/11/2018 5\' 5"  (1.651 m)    Weight:   Wt Readings from Last 1 Encounters:  11/30/18 87.9 kg    Ideal Body Weight:  56.8 kg  BMI:  Body mass index is 32.25 kg/m.  Estimated Nutritional Needs:   Kcal:  2000-2200  Protein:  115-130 gm  Fluid:  >/= 1.8 L    Molli Barrows, RD, LDN, [place] Pager [phone] After Hours Pager [phone]

## 2018-11-30 NOTE — Progress Notes (Signed)
  Echocardiogram 2D Echocardiogram has been performed.  Shelley Allison 11/30/2018, 3:29 PM

## 2018-11-30 NOTE — Progress Notes (Signed)
Pt continued to experienced worsening SOB w/ going to [facility] today. Tachypneic, desat to low 80's & diaphoretic at times as well when using BSC. Pt educated to call before using bsc and bed alarm on at this time.  MD notified. IV solumedrol started. ECHO complete CXRAY complete Daughter updated  & all questions & concerns addressed. 5-7L HFNC **Yellow MEWS communicated to night shift RN & tech. (increased RR)  SQ lovenox.

## 2018-11-30 NOTE — Progress Notes (Addendum)
NAME:  Shelley Allison, MRN:  [id number], DOB:  1955/12/23, LOS: 18 ADMISSION DATE:  2018-09-20, CONSULTATION DATE:  10/16 REFERRING MD:  [name], CHIEF COMPLAINT:  Dyspnea   Brief History   63 y/o female admitted on 9/28 with COVID pneumonia, PCCM consulted on 10/16 due to ongoing high oxygen needs.  History of present illness   This is a pleaseant 63 y/o female who was admitted to this facility on 9/28 in the setting of fever, cough with yellow sputum production, dyspnea, and 1 week of nausea vomiting and diarrhea.  [facility]  She was treated with remdesivir, steroids, and Tocilizumab.  She declined convalescent plasma treatment.  She was treated with heated high flow oxygen in the intensive care unit.  She was also treated with antibiotics.  She moved to the intensive care unit on 9/30.  A CT angiogram and bilateral lower extremity duplex ultrasound studies did not show evidence of thromboembolic disease.  She was able to transfer out of the intensive care unit on October 3.  She tells me that she still feels short of breath with any movement.  She is comfortable at rest.  Her oxygen needs have improved while at rest but she still needs quite a bit when she exerts herself.  She still needs 6 L nasal cannula while at rest.  Just moving around in bed will sometimes make her short of breath.  Moving from the bed to the chair will make her short of breath.  She also notes feeling lightheaded and dizzy whenever she gets up and moves.  She says that prior to all this she smoked briefly as a young adult and has quit over 40 years ago.  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  She had no known prior lung problems. Past Medical History  Hypertension Cerebral artery aneurysm rupture  Significant Hospital Events   9/28 admit 9/30 transfer to ICU, HHF O2 10/3 transfer back to PCU   Consults:  PCCM  Procedures:    Significant Diagnostic Tests:  10/1 vascular ultrasound > negative for lower lobe DVT 10/3 CT angiogram chest> bilateral groundglass opacification in the upper lobes, some in the lower lobes but sparing of pulmonary parenchyma in bases, no pulmonary embolism.  Micro Data:  9/28 SARS COV 2 > positive  AntimicrobialsCOVID RX:  Remdesivir September 28 through October 2 Solu-Medrol September 28 through October 7 Actemra September 29   Interim history/subjective:  As above  Objective   Blood pressure (!) 161/86, pulse (!) 104, temperature 98.1 F (36.7 C), temperature source Oral, resp. rate (!) 26, height 5\' 5"  (1.651 m), weight 87.9 kg, SpO2 90 %.        Intake/Output Summary (Last 24 hours) at 11/30/2018 1004 Last data filed at 11/30/2018 0941 Gross per 24 hour  Intake 600 ml  Output -  Net 600 ml   Filed Weights   11/28/18 0404 11/29/18 0430 11/30/18 0500  Weight: 87.5 kg 87.7 kg 87.9 kg    Examination:  General:  Resting comfortably in bed, but tachypnea, mild accessory muscle use when she moves around, speaks in full sentences HENT: NCAT OP clear PULM: Crackles bases B, normal effort CV: RRR, no mgr GI: BS+, soft, nontender MSK: normal bulk and tone Neuro: awake, alert, no distress, MAEW   Resolved Hospital Problem list    Assessment & Plan:  COVID  pneumonia with persistent hypoxemia 18 days after admission: I think the most likely explanation is prolonged lung healing from acute lung injury/ARDS.  However, I cannot rule out an underlying pulmonary parenchymal or pulmonary hypertension picture.  Considering the fact that she says that she is quite dizzy when she stands up I do worry about pulmonary hypertension. Plan: Echocardiogram Repeat chest x-ray If neither of these are revealing as to the cause for her persistent hypoxemia then consider a high-resolution CT scan of the chest Continue to wean off oxygen Out of bed as  much as possible, continue physical therapy Goal O2 saturation should be 85% or above for most of the day, though tolerate periods of hypoxemia less than this with movement or talking.  What's more important is her work of breathing and mental status.  Best practice:   Cannot obtain  Labs   CBC: Recent Labs  Lab 11/29/18 0040  WBC 10.3  HGB 12.1  HCT 39.5  MCV 90.2  PLT 255    Basic Metabolic Panel: Recent Labs  Lab 11/25/18 0358 11/27/18 0145 11/28/18 0055 11/29/18 0040 11/30/18 0122  NA 137 140 140 138 140  K 4.2 4.1 4.3 4.0 4.1  CL 103 103 102 100 100  CO2 26 26 28 27 29   GLUCOSE 93 109* 105* 108* 141*  BUN 27* 22 40* 51* 58*  CREATININE 1.08* 1.16* 1.66* 1.78* 1.47*  CALCIUM 8.0* 8.3* 8.7* 8.7* 9.1   GFR: Estimated Creatinine Clearance: 42.9 mL/min (A) (by C-G formula based on SCr of 1.47 mg/dL (H)). Recent Labs  Lab 11/29/18 0040  WBC 10.3    Liver Function Tests: Recent Labs  Lab 11/25/18 0358 11/29/18 0040  AST 24 26  ALT 39 41  ALKPHOS 63 67  BILITOT 0.9 0.8  PROT 5.9* 6.1*  ALBUMIN 2.7* 2.7*   No results for input(s): LIPASE, AMYLASE in the last 168 hours. No results for input(s): AMMONIA in the last 168 hours.  ABG No results found for: PHART, PCO2ART, PO2ART, HCO3, TCO2, ACIDBASEDEF, O2SAT   Coagulation Profile: No results for input(s): INR, PROTIME in the last 168 hours.  Cardiac Enzymes: No results for input(s): CKTOTAL, CKMB, CKMBINDEX, TROPONINI in the last 168 hours.  HbA1C: Hemoglobin A1C  Date/Time Value Ref Range Status  05/15/2015 03:52 PM 5.5  Final  04/25/2014 04:43 PM 5.8  Final    CBG: No results for input(s): GLUCAP in the last 168 hours.  Review of Systems:   Gen: Denies fever, chills, weight change, fatigue, night sweats HEENT: Denies blurred vision, double vision, hearing loss, tinnitus, sinus congestion, rhinorrhea, sore throat, neck stiffness, dysphagia PULM: per HPI CV: Denies chest pain, edema,  orthopnea, paroxysmal nocturnal dyspnea, palpitations GI: Denies abdominal pain, nausea, vomiting, diarrhea, hematochezia, melena, constipation, change in bowel habits GU: Denies dysuria, hematuria, polyuria, oliguria, urethral discharge Endocrine: Denies hot or cold intolerance, polyuria, polyphagia or appetite change Derm: Denies rash, dry skin, scaling or peeling skin change Heme: Denies easy bruising, bleeding, bleeding gums Neuro: Denies headache, numbness, weakness, slurred speech, loss of memory or consciousness   Past Medical History  She,  has a past medical history of Cerebral aneurysm rupture (HCC) (1999) and Hypertension.   Surgical History    Past Surgical History:  Procedure Laterality Date  . BRAIN SURGERY    . CESAREAN SECTION    . CHOLECYSTECTOMY N/A 04/07/2014   Procedure: LAPAROSCOPIC CHOLECYSTECTOMY;  Surgeon: 04/09/2014, MD;  Location: [facility] OR;  Service: General;  Laterality: N/A;  .  HERNIA REPAIR       Social History   reports that she quit smoking about 21 years ago. Her smoking use included cigarettes. She has a 2.50 pack-year smoking history. She has never used smokeless tobacco. She reports that she does not drink alcohol or use drugs.   Family History   Her family history includes Arthritis in her mother.   Allergies Allergies  Allergen Reactions  . Fish Allergy Hives and Shortness Of Breath    All fish and fish derivatives  . Other Hives, Shortness Of Breath and Rash    All Pitted Fruit  . Peanuts [Peanut Oil]   . Shellfish Allergy      Home Medications  Prior to Admission medications   Medication Sig Start Date End Date Taking? Authorizing Provider  acetaminophen (TYLENOL) 500 MG tablet Take 1,000 mg by mouth every 6 (six) hours as needed for mild pain or headache.   Yes [provider]  amLODipine (NORVASC) 5 MG tablet TAKE 1 TABLET(5 MG) BY MOUTH DAILY Patient taking differently: Take 5 mg by mouth daily. TAKE 1 TABLET(5 MG) BY MOUTH  DAILY 11/01/18  Yes Anderson, Chelsey L, DO  EPINEPHrine (EPIPEN) 0.3 mg/0.3 mL DEVI Inject 0.3 mg into the muscle as needed. for anaphylaxis    Yes [provider]  hydrochlorothiazide (HYDRODIURIL) 25 MG tablet TAKE 1 TABLET(25 MG) BY MOUTH DAILY Patient taking differently: Take 25 mg by mouth daily. TAKE 1 TABLET(25 MG) BY MOUTH DAILY 11/01/18  Yes Anderson, Chelsey L, DO  lisinopril (ZESTRIL) 10 MG tablet Take 1 tablet (10 mg total) by mouth daily. 11/01/18  Yes Richarda Osmond, DO     Critical care time: n/a     Roselie Awkward, MD [place] [facility] Pager: [phone] Cell: [phone] If no response, call [phone]

## 2018-11-30 NOTE — Progress Notes (Signed)
Following bedside shift report, standby assistance provided to help patient to bedside commode.  Patient independent mobility with good BUE and BLE strength.  However, oxygen desaturation with activity continues.  In transfer to [place] decreased to 67%.  Patient with tachypnea, shallow breaths, and visible increased work in breathing, c/o feeling flushed, hot, with mild anxiety.  Patient encouraged to attempt to slow her breathing and take deep breaths to the best of her ability. Increased oxygen up to 15 HFNC and slowly titrated back down to 5L HFNC, which is where we started.  Patient Spo2 and symptomatology recovered over 10 minutes, assisted back to bed.     11/30/18 0725  Vitals  Pulse Rate (!) 121  Pulse Rate Source Monitor  Resp (!) 32  Oxygen Therapy  SpO2 (!) 67 %  O2 Device HFNC  O2 Flow Rate (L/min) 5 L/min  Patient Activity (if Appropriate) Ambulating (21ft to bedside commode)  Pulse Oximetry Type Continuous  MEWS Score  MEWS RR 2  MEWS Pulse 2  MEWS Systolic 0  MEWS LOC 0  MEWS Temp 0  MEWS Score 4  MEWS Score Color Red

## 2018-11-30 NOTE — Progress Notes (Signed)
[facility] PCCM  CXR reviewed: severe persistent infiltrates bilaterally Echo result pending  Will start solumedrol 40mg  IV q12h to see if infiltrates are steroid responsive/inflammatory Will f/u echo  Roselie Awkward, MD [place] [facility] P[facility]M Pager: [phone] Cell: [phone] If no response, call [phone]

## 2018-12-01 ENCOUNTER — Inpatient Hospital Stay (HOSPITAL_COMMUNITY): Payer: BC Managed Care – PPO

## 2018-12-01 DIAGNOSIS — J988 Other specified respiratory disorders: Secondary | ICD-10-CM | POA: Diagnosis not present

## 2018-12-01 DIAGNOSIS — J069 Acute upper respiratory infection, unspecified: Secondary | ICD-10-CM | POA: Diagnosis not present

## 2018-12-01 LAB — CBC WITH DIFFERENTIAL/PLATELET
Abs Immature Granulocytes: 0.07 10*3/uL (ref 0.00–0.07)
Basophils Absolute: 0 10*3/uL (ref 0.0–0.1)
Basophils Relative: 0 %
Eosinophils Absolute: 0 10*3/uL (ref 0.0–0.5)
Eosinophils Relative: 0 %
HCT: 44.3 % (ref 36.0–46.0)
Hemoglobin: 13.5 g/dL (ref 12.0–15.0)
Immature Granulocytes: 1 %
Lymphocytes Relative: 11 %
Lymphs Abs: 1.7 10*3/uL (ref 0.7–4.0)
MCH: 28.1 pg (ref 26.0–34.0)
MCHC: 30.5 g/dL (ref 30.0–36.0)
MCV: 92.1 fL (ref 80.0–100.0)
Monocytes Absolute: 1.4 10*3/uL — ABNORMAL HIGH (ref 0.1–1.0)
Monocytes Relative: 9 %
Neutro Abs: 12 10*3/uL — ABNORMAL HIGH (ref 1.7–7.7)
Neutrophils Relative %: 79 %
Platelets: 267 10*3/uL (ref 150–400)
RBC: 4.81 MIL/uL (ref 3.87–5.11)
RDW: 14.6 % (ref 11.5–15.5)
WBC: 15.3 10*3/uL — ABNORMAL HIGH (ref 4.0–10.5)
nRBC: 0 % (ref 0.0–0.2)

## 2018-12-01 LAB — LACTIC ACID, PLASMA: Lactic Acid, Venous: 1.3 mmol/L (ref 0.5–1.9)

## 2018-12-01 LAB — CBC
HCT: 38.8 % (ref 36.0–46.0)
Hemoglobin: 12.1 g/dL (ref 12.0–15.0)
MCH: 28.1 pg (ref 26.0–34.0)
MCHC: 31.2 g/dL (ref 30.0–36.0)
MCV: 90 fL (ref 80.0–100.0)
Platelets: 238 10*3/uL (ref 150–400)
RBC: 4.31 MIL/uL (ref 3.87–5.11)
RDW: 14.6 % (ref 11.5–15.5)
WBC: 9.3 10*3/uL (ref 4.0–10.5)
nRBC: 0 % (ref 0.0–0.2)

## 2018-12-01 LAB — POCT I-STAT 7, (LYTES, BLD GAS, ICA,H+H)
Acid-Base Excess: 2 mmol/L (ref 0.0–2.0)
Bicarbonate: 28.4 mmol/L — ABNORMAL HIGH (ref 20.0–28.0)
Calcium, Ion: 1.21 mmol/L (ref 1.15–1.40)
HCT: 40 % (ref 36.0–46.0)
Hemoglobin: 13.6 g/dL (ref 12.0–15.0)
O2 Saturation: 89 %
Potassium: 4 mmol/L (ref 3.5–5.1)
Sodium: 139 mmol/L (ref 135–145)
TCO2: 30 mmol/L (ref 22–32)
pCO2 arterial: 52.2 mmHg — ABNORMAL HIGH (ref 32.0–48.0)
pH, Arterial: 7.343 — ABNORMAL LOW (ref 7.350–7.450)
pO2, Arterial: 62 mmHg — ABNORMAL LOW (ref 83.0–108.0)

## 2018-12-01 LAB — TROPONIN I (HIGH SENSITIVITY): Troponin I (High Sensitivity): 155 ng/L (ref <18)

## 2018-12-01 LAB — BASIC METABOLIC PANEL
Anion gap: 10 (ref 5–15)
BUN: 53 mg/dL — ABNORMAL HIGH (ref 8–23)
CO2: 29 mmol/L (ref 22–32)
Calcium: 8.9 mg/dL (ref 8.9–10.3)
Chloride: 101 mmol/L (ref 98–111)
Creatinine, Ser: 1.3 mg/dL — ABNORMAL HIGH (ref 0.44–1.00)
GFR calc Af Amer: 51 mL/min — ABNORMAL LOW (ref >60)
GFR calc non Af Amer: 44 mL/min — ABNORMAL LOW (ref >60)
Glucose, Bld: 110 mg/dL — ABNORMAL HIGH (ref 70–99)
Potassium: 4.8 mmol/L (ref 3.5–5.1)
Sodium: 140 mmol/L (ref 135–145)

## 2018-12-01 LAB — COMPREHENSIVE METABOLIC PANEL
ALT: 45 U/L — ABNORMAL HIGH (ref 0–44)
AST: 29 U/L (ref 15–41)
Albumin: 3.1 g/dL — ABNORMAL LOW (ref 3.5–5.0)
Alkaline Phosphatase: 88 U/L (ref 38–126)
Anion gap: 10 (ref 5–15)
BUN: 57 mg/dL — ABNORMAL HIGH (ref 8–23)
CO2: 30 mmol/L (ref 22–32)
Calcium: 9 mg/dL (ref 8.9–10.3)
Chloride: 100 mmol/L (ref 98–111)
Creatinine, Ser: 1.68 mg/dL — ABNORMAL HIGH (ref 0.44–1.00)
GFR calc Af Amer: 37 mL/min — ABNORMAL LOW (ref >60)
GFR calc non Af Amer: 32 mL/min — ABNORMAL LOW (ref >60)
Glucose, Bld: 154 mg/dL — ABNORMAL HIGH (ref 70–99)
Potassium: 4.8 mmol/L (ref 3.5–5.1)
Sodium: 140 mmol/L (ref 135–145)
Total Bilirubin: 0.8 mg/dL (ref 0.3–1.2)
Total Protein: 7.7 g/dL (ref 6.5–8.1)

## 2018-12-01 LAB — C-REACTIVE PROTEIN: CRP: 5.5 mg/dL — ABNORMAL HIGH (ref <1.0)

## 2018-12-01 LAB — PROCALCITONIN: Procalcitonin: 0.23 ng/mL

## 2018-12-01 LAB — D-DIMER, QUANTITATIVE: D-Dimer, Quant: 2.28 ug/mL-FEU — ABNORMAL HIGH (ref 0.00–0.50)

## 2018-12-01 LAB — GLUCOSE, CAPILLARY: Glucose-Capillary: 218 mg/dL — ABNORMAL HIGH (ref 70–99)

## 2018-12-01 MED ORDER — ROCURONIUM BROMIDE 10 MG/ML (PF) SYRINGE
PREFILLED_SYRINGE | INTRAVENOUS | Status: AC
  Administered 2018-12-01: 19:00:00 100 mg via INTRAVENOUS
  Filled 2018-12-01: qty 10

## 2018-12-01 MED ORDER — MIDAZOLAM HCL 2 MG/2ML IJ SOLN: 2.0000 mg | INTRAMUSCULAR | Status: DC | PRN

## 2018-12-01 MED ORDER — ALBUTEROL SULFATE (2.5 MG/3ML) 0.083% IN NEBU
2.5000 mg | INHALATION_SOLUTION | Freq: Four times a day (QID) | RESPIRATORY_TRACT | Status: DC
  Administered 2018-12-01 – 2018-12-03 (×7): 2.5 mg via RESPIRATORY_TRACT
  Filled 2018-12-01 (×7): qty 3

## 2018-12-01 MED ORDER — FENTANYL CITRATE (PF) 100 MCG/2ML IJ SOLN: 100.0000 ug | Freq: Once | INTRAMUSCULAR | Status: DC

## 2018-12-01 MED ORDER — STERILE WATER FOR INJECTION IJ SOLN
INTRAMUSCULAR | Status: AC
  Filled 2018-12-01: qty 10

## 2018-12-01 MED ORDER — ROCURONIUM BROMIDE 10 MG/ML (PF) SYRINGE
100.0000 mg | PREFILLED_SYRINGE | Freq: Once | INTRAVENOUS | Status: AC
  Administered 2018-12-01: 19:00:00 100 mg via INTRAVENOUS

## 2018-12-01 MED ORDER — METOPROLOL TARTRATE 5 MG/5ML IV SOLN
2.5000 mg | INTRAVENOUS | Status: DC | PRN
  Administered 2018-12-01: 5 mg via INTRAVENOUS
  Filled 2018-12-01: qty 5

## 2018-12-01 MED ORDER — ETOMIDATE 2 MG/ML IV SOLN
INTRAVENOUS | Status: AC
  Administered 2018-12-01: 20 mg via INTRAVENOUS
  Filled 2018-12-01: qty 20

## 2018-12-01 MED ORDER — FENTANYL CITRATE (PF) 100 MCG/2ML IJ SOLN: 50.0000 ug | Freq: Once | INTRAMUSCULAR | Status: DC

## 2018-12-01 MED ORDER — VECURONIUM BROMIDE 10 MG IV SOLR
INTRAVENOUS | Status: AC
  Filled 2018-12-01: qty 10

## 2018-12-01 MED ORDER — FENTANYL CITRATE (PF) 100 MCG/2ML IJ SOLN
INTRAMUSCULAR | Status: AC
  Filled 2018-12-01: qty 2

## 2018-12-01 MED ORDER — FENTANYL 2500MCG IN NS 250ML (10MCG/ML) PREMIX INFUSION
50.0000 ug/h | INTRAVENOUS | Status: DC
  Administered 2018-12-01: 50 ug/h via INTRAVENOUS
  Administered 2018-12-02: 150 ug/h via INTRAVENOUS
  Administered 2018-12-03 (×2): 175 ug/h via INTRAVENOUS
  Administered 2018-12-04: 14:00:00 200 ug/h via INTRAVENOUS
  Filled 2018-12-01 (×5): qty 250

## 2018-12-01 MED ORDER — MIDAZOLAM BOLUS VIA INFUSION
1.0000 mg | INTRAVENOUS | Status: DC | PRN
  Filled 2018-12-01: qty 2

## 2018-12-01 MED ORDER — PROPOFOL 10 MG/ML IV BOLUS
INTRAVENOUS | Status: AC
  Filled 2018-12-01: qty 20

## 2018-12-01 MED ORDER — ETOMIDATE 2 MG/ML IV SOLN
20.0000 mg | Freq: Once | INTRAVENOUS | Status: AC
  Administered 2018-12-01: 19:00:00 20 mg via INTRAVENOUS

## 2018-12-01 MED ORDER — SODIUM CHLORIDE 0.9 % IV SOLN
INTRAVENOUS | Status: DC | PRN
  Administered 2018-12-01: 1000 mL via INTRAVENOUS

## 2018-12-01 MED ORDER — MIDAZOLAM 50MG/50ML (1MG/ML) PREMIX INFUSION
0.0000 mg/h | INTRAVENOUS | Status: DC
  Administered 2018-12-01: 20:00:00 2 mg/h via INTRAVENOUS
  Administered 2018-12-02: 5 mg/h via INTRAVENOUS
  Administered 2018-12-02: 3 mg/h via INTRAVENOUS
  Administered 2018-12-03: 6 mg/h via INTRAVENOUS
  Administered 2018-12-03: 2.5 mg/h via INTRAVENOUS
  Administered 2018-12-04: 05:00:00 4 mg/h via INTRAVENOUS
  Filled 2018-12-01 (×6): qty 50

## 2018-12-01 MED ORDER — MIDAZOLAM HCL 2 MG/2ML IJ SOLN
INTRAMUSCULAR | Status: AC
  Administered 2018-12-01: 2 mg via INTRAVENOUS
  Filled 2018-12-01: qty 4

## 2018-12-01 MED ORDER — FENTANYL BOLUS VIA INFUSION
50.0000 ug | INTRAVENOUS | Status: DC | PRN
  Filled 2018-12-01: qty 50

## 2018-12-01 MED ORDER — SUCCINYLCHOLINE CHLORIDE 200 MG/10ML IV SOSY
PREFILLED_SYRINGE | INTRAVENOUS | Status: AC
  Filled 2018-12-01: qty 10

## 2018-12-01 MED ORDER — MIDAZOLAM HCL 2 MG/2ML IJ SOLN
2.0000 mg | Freq: Once | INTRAMUSCULAR | Status: AC
  Administered 2018-12-01: 19:00:00 2 mg via INTRAVENOUS

## 2018-12-01 NOTE — Progress Notes (Signed)
[facility] Progress Note Patient Name: Shelley Allison DOB: 02/19/1955 MRN: [id number]   Date of Service  12/01/2018  HPI/Events of Note  ABG on 100%/PRVC 35/TV 340/P 10  eICU Interventions  Will order: 1. Increase TV to 400 mL. 2. Repeat ABG at 2 AM.      Intervention Category Major Interventions: Acid-Base disturbance - evaluation and management;Respiratory failure - evaluation and management  Sommer,Steven Cornelia Copa 12/01/2018, 10:42 PM

## 2018-12-01 NOTE — Progress Notes (Signed)
[facility] Progress Note Patient Name: Shelley Allison DOB: 06/30/1955 MRN: [id number]   Date of Service  12/01/2018  HPI/Events of Note  ABG on 100%/PRVC 28/TV 340/P 10 = 7.10/97/145/NA  eICU Interventions  Will order: 1. Increase PRVC rate to 35. 2. ABG at 10:15 PM.     Intervention Category Major Interventions: Acid-Base disturbance - evaluation and management;Respiratory failure - evaluation and management  Shelley Allison 12/01/2018, 9:02 PM

## 2018-12-01 NOTE — Progress Notes (Signed)
Nursing assistant reported patient had called out for assistance to transfer to bedside commode approximately 10-15 minutes prior to rapid response call by nursing.  Nurse tech reported providing standby assistance to patient who stood and pivoted to bedside commode with 6L o2 HFNC in place.  Patient reportedly asked nurse tech for privacy and promised to use call bell as she was instructed call for assistance when ready for help to get back to bed.   At 18:18 Nurse Estill Bamberg was assisting primary nurse with final medication pass.  Upon entering room, patient was seen slumped back, sitting on bedside commode and was non-responsive to voice.  Primary nurse was within 20 feet from patient's room voice heard nurse yelling to help STAT.  Upon entering, patient was indeed non-responsive to all but sternal rub, RN confirmed carotid pulse present, high flow nasal cannula was seen on floor, NOT in place, spo2 monitor was off patient's finger. RAPID RESPONSE CALLED. Yelled for additional assistance from floor. Primary RN and fellow nurse together lifted patient off bedside commode onto and up into bed.  HFNC replaced into nares, o2 increased to 15L.  Non-Rebreather mask also applied, increased to 15L.  Patient was placed on telemetry monitor, BP cycled. ICU Charge RN and RT to bedside. Dr. Cruzita Lederer paged.  ICU transfer orders received.     12/01/18 1821  Vitals  BP (!) 167/93  MAP (mmHg) 113  BP Location Right Arm  BP Method Automatic  Patient Position (if appropriate) Lying  Pulse Rate (!) 121  Pulse Rate Source Monitor  Resp (!) 46  Oxygen Therapy  SpO2 97 %  O2 Device Non-rebreather Mask  O2 Flow Rate (L/min) 15 L/min  Patient Activity (if Appropriate) In bed  Pulse Oximetry Type Continuous  PCA/Epidural/Spinal Assessment  Respiratory Pattern Labored;Accessory muscle use;Shallow;Tachypnea  Glasgow Coma Scale  Eye Opening 2  Best Verbal Response (NON-intubated) 1  Best Motor Response 4  Glasgow Coma  Scale Score 7  Level of Consciousness  Level of Consciousness Responds to Pain  MEWS Score  MEWS RR 3  MEWS Pulse 2  MEWS Systolic 0  MEWS LOC 2  MEWS Temp 0  MEWS Score 7  MEWS Score Color Red  MEWS Assessment  Is this an acute change? Yes  MEWS guidelines implemented *See [place]  Provider Notification  Provider Name/Title Dr. Cruzita Lederer  Date Provider Notified 12/01/18  Time Provider Notified [id number]  Notification Type Page  Notification Reason Change in status (RAPID RESPONSE)  Response See new orders

## 2018-12-01 NOTE — Progress Notes (Signed)
Responded to overhead CODE BLUE-by the time I donned and arrived to the patient's room-she was very lethargic but responding.  She was on 100% nonrebreather mask.  Per bedside nurse-patient was not given any narcotics or benzodiazepines.  Bedside nurse reported that when she checked on the patient-patient had removed the oxygen and was very lethargic.    On exam:  Very lethargic-anxiety-but subsequently dozes off.  Very tachypneic.  Pupils are dilated but equally reactive. Chest: Transmitted airway sounds heard bilaterally CVS: S1-S2 regular Abdomen: Soft nontender nondistended Neurology: Moves all 4 extremities on commands-appears to be nonfocal.  ABG    Component Value Date/Time   PHART 7.343 (L) 12/01/2018 1850   PCO2ART 52.2 (H) 12/01/2018 1850   PO2ART 62.0 (L) 12/01/2018 1850   HCO3 28.4 (H) 12/01/2018 1850   TCO2 30 12/01/2018 1850   O2SAT 89.0 12/01/2018 1850   Impression with plan: Acute metabolic encephalopathy: Suspect secondary to hypoxemia-patient may have removed oxygen inadvertently.  But given prior history of intracranial aneurysmal rupture-we will obtain a stat CT head.  Given that she is so lethargic--and very tachypneic-concern for airway protection-emergently intubated by CCM at bedside.  Acute hypoxic respiratory failure: Secondary to Covid 19 pneumonia.  Full vent support per PCCM.  Post intubation labs, ABG, chest x-ray-CT head all entered.  I have signed out to Dr. Linna Hoff night MD.  I have patients mother over the phone.  She is aware of critical condition of this patient.

## 2018-12-01 NOTE — Progress Notes (Signed)
Arterial blood gas results called to Kimberly at [facility]. Ph=[phone]/PCO2= >97 PO2=111.

## 2018-12-01 NOTE — Progress Notes (Signed)
PROGRESS NOTE  Shelley Allison ZSM:[id number] DOB: [birth year] DOA: 2018/11/19 PCP: Richarda Osmond, DO   LOS: 19 days   Brief Narrative / Interim history: 63yo with a history of HTN and cerebral artery aneurysm rupture who presented to the ED 9/28 with 1 week of nausea vomiting and diarrhea followed by progressive shortness of breath with fever which began on 9/27.  She had attended a large wedding prior to the onset of her symptoms.  In the ED she was found to be hypoxic and confirmed to be SARS-CoV-2 positive.  Chest x-ray revealed bilateral infiltrates.  Hospital course complicated by persistent hypoxia  Significant Events: 9/21 approximate onset of GI symptoms 9/27 onset of pulmonary symptoms 9/28 admit  9/30 transfer to ICU 10/3 transfer back to PCU  COVID-19 specific Treatment: Remdesivir 9/28 >10/2 Solumedrol 9/28 > 10/7 Actemra 9/29 Convalescent plasma - patient declined   Subjective / 24h Interval events: -Denies any shortness of breath, denies any chest pain, no abdominal pain, no nausea or vomiting  Assessment & Plan: Principal Problem:   Respiratory tract infection due to COVID-19 virus Active Problems:   OBESITY, NOS   HYPERTENSION, BENIGN SYSTEMIC   CEREBRAL ANEURYSM, RUPTURED   Pneumonia due to COVID-19 virus   Acute respiratory failure with hypoxia (HCC)   Acute pulmonary embolus ([place])   Essential hypertension   AKI (acute kidney injury) ([place])   Morbid obesity with BMI of 70 and over, adult ([place])   CAP (community acquired pneumonia)   Acute respiratory disease due to COVID-19 virus   Principal Problem Acute hypoxic respiratory failure -CT angio negative for PE 10/3, bilateral LE negative for DVT -wean off oxygen as able, continue IS, flutter valve, inhalers -Completed steroids, Remdesivir, Actemra -She has persistent hypoxia with very little reserves.  She is fairly stable at rest but had to be placed back up on 15 L high flow nasal cannula with  just walking a few feet and desatted into the 60s. -Chest x-ray yesterday showed slightly worse infiltrates, pulmonology consulted, discussed care with Dr. [place] Bells and appreciate input.  She underwent a 2D echo with hyperdynamic EF 70-75%, normal RV and estimated PA pressure of 50 -Placed on steroids due to concern for worsening infiltrates.  Of note, CRP is elevated again. -Continue to closely monitor, low threshold to move back to the ICU if she requires more than 15 L  Active Problems HTN -Continue metoprolol, blood pressure stable  AKI -Due to diuresis, creatinine peaked 1.78 but improving, 1.3 this morning  Transaminitis  -LFTs normalized  History of ruptured cerebral artery aneurysm  -stable   Scheduled Meds: . albuterol  1-2 puff Inhalation Q6H  . Chlorhexidine Gluconate Cloth  6 each Topical Daily  . enoxaparin (LOVENOX) injection  50 mg Subcutaneous Q24H  . feeding supplement (ENSURE ENLIVE)  237 mL Oral TID BM  . methylPREDNISolone (SOLU-MEDROL) injection  40 mg Intravenous Q12H  . metoprolol tartrate  50 mg Oral BID   Continuous Infusions: PRN Meds:.acetaminophen, chlorpheniramine-HYDROcodone, guaiFENesin-dextromethorphan, hydrALAZINE, ondansetron **OR** ondansetron (ZOFRAN) IV, oxyCODONE, sodium chloride, zolpidem  DVT prophylaxis: Lovenox  Code Status: Full code Family Communication: d/w Ellison Hughs, daughter, [phone] on 10/16 Disposition Plan: home when stable   Consultants:  PCCM   Procedures:  2D echo IMPRESSIONS    1. Left ventricular ejection fraction, by visual estimation, is 70 to 75%. The left ventricle has hyperdynamic function. There is no left ventricular hypertrophy.  2. Global right ventricle has normal systolic function.The right ventricular size is normal.  No increase in right ventricular wall thickness.  3. Estimated PA pressure of 50 mmHg.  4. Left atrial size was normal.  5. Right atrial size was normal.  6. The mitral valve is normal  in structure. No evidence of mitral valve regurgitation.  7. The tricuspid valve is grossly normal. Tricuspid valve regurgitation is trivial.  8. The aortic valve is normal in structure. Aortic valve regurgitation was not visualized by color flow Doppler.  9. The pulmonic valve was grossly normal. Pulmonic valve regurgitation is not visualized by color flow Doppler. 10. Moderately elevated pulmonary artery systolic pressure. 11. The atrial septum is grossly normal.    Microbiology  None   Antimicrobials: None    Objective: Vitals:   12/01/18 0442 12/01/18 0521 12/01/18 0800 12/01/18 0813  BP:  137/75  (!) 167/96  Pulse:  95 98 99  Resp:  20    Temp:  97.7 F (36.5 C)  97.9 F (36.6 C)  TempSrc:  Oral  Oral  SpO2:  94% 99% 93%  Weight: 87.3 kg     Height:        Intake/Output Summary (Last 24 hours) at 12/01/2018 1255 Last data filed at 12/01/2018 1038 Gross per 24 hour  Intake 1080 ml  Output 550 ml  Net 530 ml   Filed Weights   11/29/18 0430 11/30/18 0500 12/01/18 0442  Weight: 87.7 kg 87.9 kg 87.3 kg    Examination: Constitutional: Eating breakfast but appears tachypneic Eyes: No scleral icterus seen ENMT: Moist mucous membranes Neck: normal, supple Respiratory: Shallow breathing, tachypneic, diminished but overall clear without wheezing or crackles Cardiovascular: Regular rate and rhythm, no murmurs.  No edema Abdomen: Soft, NT, ND, positive bowel sounds Musculoskeletal: no clubbing / cyanosis.  Skin: No rashes seen Neurologic: Nonfocal, equal strength Psychiatric: Normal judgment and insight. Alert and oriented x 3. Normal mood.   Data Reviewed: I have independently reviewed following labs and imaging studies   CBC: Recent Labs  Lab 11/29/18 0040 12/01/18 0027  WBC 10.3 9.3  HGB 12.1 12.1  HCT 39.5 38.8  MCV 90.2 90.0  PLT 255 238   Basic Metabolic Panel: Recent Labs  Lab 11/27/18 0145 11/28/18 0055 11/29/18 0040 11/30/18 0122 12/01/18  0027  NA 140 140 138 140 140  K 4.1 4.3 4.0 4.1 4.8  CL 103 102 100 100 101  CO2 26 28 27 29 29   GLUCOSE 109* 105* 108* 141* 110*  BUN 22 40* 51* 58* 53*  CREATININE 1.16* 1.66* 1.78* 1.47* 1.30*  CALCIUM 8.3* 8.7* 8.7* 9.1 8.9   GFR: Estimated Creatinine Clearance: 48.3 mL/min (A) (by C-G formula based on SCr of 1.3 mg/dL (H)). Liver Function Tests: Recent Labs  Lab 11/25/18 0358 11/29/18 0040  AST 24 26  ALT 39 41  ALKPHOS 63 67  BILITOT 0.9 0.8  PROT 5.9* 6.1*  ALBUMIN 2.7* 2.7*   No results for input(s): LIPASE, AMYLASE in the last 168 hours. No results for input(s): AMMONIA in the last 168 hours. Coagulation Profile: No results for input(s): INR, PROTIME in the last 168 hours. Cardiac Enzymes: No results for input(s): CKTOTAL, CKMB, CKMBINDEX, TROPONINI in the last 168 hours. BNP (last 3 results) No results for input(s): PROBNP in the last 8760 hours. HbA1C: No results for input(s): HGBA1C in the last 72 hours. CBG: No results for input(s): GLUCAP in the last 168 hours. Lipid Profile: No results for input(s): CHOL, HDL, LDLCALC, TRIG, CHOLHDL, LDLDIRECT in the last 72 hours. Thyroid Function  Tests: No results for input(s): TSH, T4TOTAL, FREET4, T3FREE, THYROIDAB in the last 72 hours. Anemia Panel: No results for input(s): VITAMINB12, FOLATE, FERRITIN, TIBC, IRON, RETICCTPCT in the last 72 hours. Urine analysis:    Component Value Date/Time   COLORURINE YELLOW 11/15/2018 2215   APPEARANCEUR HAZY (A) 11/15/2018 2215   LABSPEC 1.018 11/15/2018 2215   PHURINE 6.0 11/15/2018 2215   GLUCOSEU NEGATIVE 11/15/2018 2215   HGBUR NEGATIVE 11/15/2018 2215   HGBUR trace-intact 11/14/2006 0829   BILIRUBINUR NEGATIVE 11/15/2018 2215   KETONESUR NEGATIVE 11/15/2018 2215   PROTEINUR NEGATIVE 11/15/2018 2215   UROBILINOGEN 1.0 04/06/2014 1816   NITRITE NEGATIVE 11/15/2018 2215   LEUKOCYTESUR NEGATIVE 11/15/2018 2215   Sepsis Labs: Invalid input(s): PROCALCITONIN,  LACTICIDVEN  No results found for this or any previous visit (from the past 240 hour(s)).   Radiology Studies: Dg Chest Port 1 View  Result Date: 11/30/2018 CLINICAL DATA:  COVID-19, shortness of breath EXAM: PORTABLE CHEST 1 VIEW COMPARISON:  11/26/2018 FINDINGS: No significant interval change in AP portable examination with extensive, diffuse heterogeneous and interstitial pulmonary opacity, most dense and consolidative appearing in the left mid lung and peripheral right upper lobe. No new opacity. The heart and mediastinum are unremarkable. IMPRESSION: No significant interval change in AP portable examination with extensive, diffuse heterogeneous and interstitial pulmonary opacity, most dense and consolidative appearing in the left mid lung and peripheral right upper lobe. Findings are consistent with COVID-19 infection. No new opacity. Electronically Signed   By: Lauralyn Primes M.D.   On: 11/30/2018 12:50    Pamella Pert, MD, PhD [facility]  Contact via  [email or web address]  [facility] Office Info P: [phone] F: [phone]

## 2018-12-01 NOTE — Progress Notes (Signed)
NAME:  Shelley Allison, MRN:  [id number], DOB:  [birth year], LOS: 19 ADMISSION DATE:  10/23/2018, CONSULTATION DATE:  10/16 REFERRING MD:  [name], CHIEF COMPLAINT:  Dyspnea   Brief History   63 y/o female admitted on 9/28 with COVID pneumonia, PCCM consulted on 10/16 due to ongoing high oxygen needs.  History of present illness   This is a pleaseant 63 y/o female who was admitted to this facility on 9/28 in the setting of fever, cough with yellow sputum production, dyspnea, and 1 week of nausea vomiting and diarrhea.  [facility]  She was treated with remdesivir, steroids, and Tocilizumab.  She declined convalescent plasma treatment.  She was treated with heated high flow oxygen in the intensive care unit.  She was also treated with antibiotics.  She moved to the intensive care unit on 9/30.  A CT angiogram and bilateral lower extremity duplex ultrasound studies did not show evidence of thromboembolic disease.  She was able to transfer out of the intensive care unit on October 3.  Yesterday she still felt SOB with exertion, comfortable at rest.  Still on 6L a rest.  Lightheaded and dizzy with movement.     Today she was found down, encephalopathic, non responsive.  She had a pulse, was tachypneic to 50s, and her oxygen was off.   She improved with NRB (97% sat, saying name, but still very lethargic and tachypneic)  Past Medical History  Hypertension Cerebral artery aneurysm rupture  Significant Hospital Events   9/28 admit 9/30 transfer to ICU, HHF O2 10/3 transfer back to PCU  Consults:  PCCM  Procedures:    Significant Diagnostic Tests:  10/1 vascular ultrasound > negative for lower lobe DVT 10/3 CT angiogram chest> bilateral groundglass opacification in the upper lobes, some in the lower lobes but sparing of pulmonary parenchyma in bases, no pulmonary embolism.  Micro Data:  9/28 SARS COV 2 > positive  AntimicrobialsCOVID RX:  Remdesivir  September 28 through October 2 Solu-Medrol September 28 through October 7 Actemra September 29   Interim history/subjective:  As above  Objective   Blood pressure (!) 167/93, pulse (!) 121, temperature (!) 97.5 F (36.4 C), temperature source Oral, resp. rate (!) 46, height 5\' 5"  (1.651 m), weight 87.3 kg, SpO2 99 %.    Vent Mode: PRVC FiO2 (%):  [100 %] 100 % Set Rate:  [28 bmp] 28 bmp Vt Set:  [340 mL] 340 mL PEEP:  [10 cmH20] 10 cmH20 Plateau Pressure:  [38 cmH20] 38 cmH20   Intake/Output Summary (Last 24 hours) at 12/01/2018 1953 Last data filed at 12/01/2018 1818 Gross per 24 hour  Intake 780 ml  Output 700 ml  Net 80 ml   Filed Weights   11/29/18 0430 11/30/18 0500 12/01/18 0442  Weight: 87.7 kg 87.9 kg 87.3 kg    Examination:  General:  Tachypneic.  Working hard to breath.  HENT: NCAT OP clear PERRL, moving 4 extremities, but not following all commands consistently.  PULM: Crackles bases B,  CV: RRR, no mgr, sinus tach.  GI: BS+, soft, nontender MSK: normal bulk and tone Neuro: responds to simple commands (answers to say her name when asked).  Otherwise very lethargic.  NNo focal findings.    Resolved Hospital Problem list    Assessment & Plan:  Acute encephalopathy:  Possibly due to prolonged hypoxia?  Unk how long she was without oxygen.  No other obvious cause.  Given hx of hemorrhagic stroke,  with intubated and check CT head ro hemorrhagic cva or major ischemic stroke.     COVID pneumonia with persistent hypoxemia 18 days after admission: I think the most likely explanation is prolonged lung healing from acute lung injury/ARDS.  However, I cannot rule out an underlying pulmonary parenchymal or pulmonary hypertension picture.  Considering the fact that she says that she is quite dizzy when she stands up I do worry about pulmonary hypertension. Plan:  echo: PA pressure , suggesting pulm HTN.   consider a high-resolution CT scan of the chest Now on  vent.   CXR pending.   Best practice:     Labs   CBC: Recent Labs  Lab 11/29/18 0040 12/01/18 0027 12/01/18 1850  WBC 10.3 9.3  --   HGB 12.1 12.1 13.6  HCT 39.5 38.8 40.0  MCV 90.2 90.0  --   PLT 255 238  --     Basic Metabolic Panel: Recent Labs  Lab 11/27/18 0145 11/28/18 0055 11/29/18 0040 11/30/18 0122 12/01/18 0027 12/01/18 1850  NA 140 140 138 140 140 139  K 4.1 4.3 4.0 4.1 4.8 4.0  CL 103 102 100 100 101  --   CO2 26 28 27 29 29   --   GLUCOSE 109* 105* 108* 141* 110*  --   BUN 22 40* 51* 58* 53*  --   CREATININE 1.16* 1.66* 1.78* 1.47* 1.30*  --   CALCIUM 8.3* 8.7* 8.7* 9.1 8.9  --    GFR: Estimated Creatinine Clearance: 48.3 mL/min (A) (by C-G formula based on SCr of 1.3 mg/dL (H)). Recent Labs  Lab 11/29/18 0040 12/01/18 0027  WBC 10.3 9.3    Liver Function Tests: Recent Labs  Lab 11/25/18 0358 11/29/18 0040  AST 24 26  ALT 39 41  ALKPHOS 63 67  BILITOT 0.9 0.8  PROT 5.9* 6.1*  ALBUMIN 2.7* 2.7*   No results for input(s): LIPASE, AMYLASE in the last 168 hours. No results for input(s): AMMONIA in the last 168 hours.  ABG    Component Value Date/Time   PHART 7.343 (L) 12/01/2018 1850   PCO2ART 52.2 (H) 12/01/2018 1850   PO2ART 62.0 (L) 12/01/2018 1850   HCO3 28.4 (H) 12/01/2018 1850   TCO2 30 12/01/2018 1850   O2SAT 89.0 12/01/2018 1850     Coagulation Profile: No results for input(s): INR, PROTIME in the last 168 hours.  Cardiac Enzymes: No results for input(s): CKTOTAL, CKMB, CKMBINDEX, TROPONINI in the last 168 hours.  HbA1C: Hemoglobin A1C  Date/Time Value Ref Range Status  05/15/2015 03:52 PM 5.5  Final  04/25/2014 04:43 PM 5.8  Final    CBG: Recent Labs  Lab 12/01/18 1842  GLUCAP 218*    Review of Systems:   Gen: Denies fever, chills, weight change, fatigue, night sweats HEENT: Denies blurred vision, double vision, hearing loss, tinnitus, sinus congestion, rhinorrhea, sore throat, neck stiffness, dysphagia  PULM: per HPI CV: Denies chest pain, edema, orthopnea, paroxysmal nocturnal dyspnea, palpitations GI: Denies abdominal pain, nausea, vomiting, diarrhea, hematochezia, melena, constipation, change in bowel habits GU: Denies dysuria, hematuria, polyuria, oliguria, urethral discharge Endocrine: Denies hot or cold intolerance, polyuria, polyphagia or appetite change Derm: Denies rash, dry skin, scaling or peeling skin change Heme: Denies easy bruising, bleeding, bleeding gums Neuro: Denies headache, numbness, weakness, slurred speech, loss of memory or consciousness   Past Medical History  She,  has a past medical history of Cerebral aneurysm rupture (HCC) (1999) and Hypertension.   Surgical History  Past Surgical History:  Procedure Laterality Date  . BRAIN SURGERY    . CESAREAN SECTION    . CHOLECYSTECTOMY N/A 04/07/2014   Procedure: LAPAROSCOPIC CHOLECYSTECTOMY;  Surgeon: Doreen Salvage, MD;  Location: [facility];  Service: General;  Laterality: N/A;  . HERNIA REPAIR       Social History   reports that she quit smoking about 21 years ago. Her smoking use included cigarettes. She has a 2.50 pack-year smoking history. She has never used smokeless tobacco. She reports that she does not drink alcohol or use drugs.   Family History   Her family history includes Arthritis in her mother.   Allergies Allergies  Allergen Reactions  . Fish Allergy Hives and Shortness Of Breath    All fish and fish derivatives  . Other Hives, Shortness Of Breath and Rash    All Pitted Fruit  . Peanuts [Peanut Oil]   . Shellfish Allergy      Home Medications  Prior to Admission medications   Medication Sig Start Date End Date Taking? Authorizing Provider  acetaminophen (TYLENOL) 500 MG tablet Take 1,000 mg by mouth every 6 (six) hours as needed for mild pain or headache.   Yes [provider]  amLODipine (NORVASC) 5 MG tablet TAKE 1 TABLET(5 MG) BY MOUTH DAILY Patient taking differently: Take 5 mg  by mouth daily. TAKE 1 TABLET(5 MG) BY MOUTH DAILY 11/01/18  Yes Anderson, Chelsey L, DO  EPINEPHrine (EPIPEN) 0.3 mg/0.3 mL DEVI Inject 0.3 mg into the muscle as needed. for anaphylaxis    Yes [provider]  hydrochlorothiazide (HYDRODIURIL) 25 MG tablet TAKE 1 TABLET(25 MG) BY MOUTH DAILY Patient taking differently: Take 25 mg by mouth daily. TAKE 1 TABLET(25 MG) BY MOUTH DAILY 11/01/18  Yes Anderson, Chelsey L, DO  lisinopril (ZESTRIL) 10 MG tablet Take 1 tablet (10 mg total) by mouth daily. 11/01/18  Yes Anderson, Chelsey L, DO     Critical care time:36 minutes.

## 2018-12-01 NOTE — Progress Notes (Signed)
Overhead page for code blue/medical alert. Rapid response RN/RT team came to room to find patient on non-rebreather and non-responsive to deep painful stimuli. Immediately paged attending MD and CCM MD. Patient transferred to ICU on non-rebreather and intubation medication pulled. 16mcg Fentanyl, 20mg  etomidate and 154mcg rocuronium administered via midline. Intubation performed by Dr. Duwayne Heck at approximately 7pm with the assistance of the bougie. Chest xray ordered. Patient currently stable.

## 2018-12-01 NOTE — Progress Notes (Signed)
eLink Physician-Brief Progress Note Patient Name: Shelley Allison DOB: 03-Dec-1955 MRN: [id number]   Date of Service  12/01/2018  HPI/Events of Note  Sinus Tachycardia - HR = 163. Patient has scheduled Metoprolol per tube, however, has no gastric tube.   eICU Interventions  Will order: 1. D/C Metoprolol per tube.  2. Metoprolol 2.5-5 mg IV Q 3 hours PRN HR > 115.      Intervention Category Major Interventions: Arrhythmia - evaluation and management  Sommer,Steven Eugene 12/01/2018, 8:23 PM

## 2018-12-01 NOTE — Progress Notes (Signed)
RT transported patient from ICU TO CT and back without any complications.

## 2018-12-01 NOTE — Progress Notes (Signed)
ABG called by Resp , Tim.  ABG 7.144, >97, 111 and unable to read HCO3. Values not crossing over into chart d/t out of range readings.

## 2018-12-01 NOTE — Progress Notes (Addendum)
POST intubation ABG results will not cross over due too critical value. ABG results: PH 7.10 CO2 >97, PAO2 145. Results called to [facility] in [place].

## 2018-12-01 NOTE — Procedures (Signed)
Intubation Procedure Note Shelley Allison [id number] 04/30/1955  Procedure: Intubation Indications: Airway protection and maintenance  tachypnea Procedure Details Consent: emergent Time Out: Verified patient identification, verified procedure, site/side was marked, verified correct patient position, special equipment/implants available, medications/allergies/relevent history reviewed, required imaging and test results available.  Performed  Maximum sterile technique was used including cap, gloves, gown and hand hygiene.  MAC glidescope (3)    Evaluation Hemodynamic Status: BP stable throughout; O2 sats: transiently fell during during procedure Patient's Current Condition: stable Complications: No apparent complications Patient did tolerate procedure well. Chest X-ray ordered to verify placement.  CXR: pending.   Collier Bullock 12/01/2018

## 2018-12-02 ENCOUNTER — Other Ambulatory Visit: Payer: Self-pay

## 2018-12-02 DIAGNOSIS — R0902 Hypoxemia: Secondary | ICD-10-CM

## 2018-12-02 LAB — CBC WITH DIFFERENTIAL/PLATELET
Abs Immature Granulocytes: 0.05 10*3/uL (ref 0.00–0.07)
Basophils Absolute: 0 10*3/uL (ref 0.0–0.1)
Basophils Relative: 0 %
Eosinophils Absolute: 0.3 10*3/uL (ref 0.0–0.5)
Eosinophils Relative: 2 %
HCT: 39.2 % (ref 36.0–46.0)
Hemoglobin: 11.9 g/dL — ABNORMAL LOW (ref 12.0–15.0)
Immature Granulocytes: 0 %
Lymphocytes Relative: 5 %
Lymphs Abs: 0.5 10*3/uL — ABNORMAL LOW (ref 0.7–4.0)
MCH: 28 pg (ref 26.0–34.0)
MCHC: 30.4 g/dL (ref 30.0–36.0)
MCV: 92.2 fL (ref 80.0–100.0)
Monocytes Absolute: 0.4 10*3/uL (ref 0.1–1.0)
Monocytes Relative: 4 %
Neutro Abs: 10.1 10*3/uL — ABNORMAL HIGH (ref 1.7–7.7)
Neutrophils Relative %: 89 %
Platelets: 231 10*3/uL (ref 150–400)
RBC: 4.25 MIL/uL (ref 3.87–5.11)
RDW: 14.7 % (ref 11.5–15.5)
WBC: 11.3 10*3/uL — ABNORMAL HIGH (ref 4.0–10.5)
nRBC: 0 % (ref 0.0–0.2)

## 2018-12-02 LAB — CBC
HCT: 41.4 % (ref 36.0–46.0)
Hemoglobin: 12.5 g/dL (ref 12.0–15.0)
MCH: 28 pg (ref 26.0–34.0)
MCHC: 30.2 g/dL (ref 30.0–36.0)
MCV: 92.8 fL (ref 80.0–100.0)
Platelets: 233 10*3/uL (ref 150–400)
RBC: 4.46 MIL/uL (ref 3.87–5.11)
RDW: 14.6 % (ref 11.5–15.5)
WBC: 11.3 10*3/uL — ABNORMAL HIGH (ref 4.0–10.5)
nRBC: 0 % (ref 0.0–0.2)

## 2018-12-02 LAB — POCT I-STAT 7, (LYTES, BLD GAS, ICA,H+H)
Acid-Base Excess: 5 mmol/L — ABNORMAL HIGH (ref 0.0–2.0)
Acid-Base Excess: 3 mmol/L — ABNORMAL HIGH (ref 0.0–2.0)
Bicarbonate: 32.2 mmol/L — ABNORMAL HIGH (ref 20.0–28.0)
Bicarbonate: 31.4 mmol/L — ABNORMAL HIGH (ref 20.0–28.0)
Calcium, Ion: 1.2 mmol/L (ref 1.15–1.40)
Calcium, Ion: 1.12 mmol/L — ABNORMAL LOW (ref 1.15–1.40)
HCT: 38 % (ref 36.0–46.0)
HCT: 45 % (ref 36.0–46.0)
Hemoglobin: 12.9 g/dL (ref 12.0–15.0)
Hemoglobin: 15.3 g/dL — ABNORMAL HIGH (ref 12.0–15.0)
O2 Saturation: 97 %
O2 Saturation: 97 %
Patient temperature: 97.7
Potassium: 4.7 mmol/L (ref 3.5–5.1)
Potassium: 5 mmol/L (ref 3.5–5.1)
Sodium: 141 mmol/L (ref 135–145)
Sodium: 139 mmol/L (ref 135–145)
TCO2: 34 mmol/L — ABNORMAL HIGH (ref 22–32)
TCO2: 33 mmol/L — ABNORMAL HIGH (ref 22–32)
pCO2 arterial: 59.1 mmHg — ABNORMAL HIGH (ref 32.0–48.0)
pCO2 arterial: 61.4 mmHg — ABNORMAL HIGH (ref 32.0–48.0)
pH, Arterial: 7.343 — ABNORMAL LOW (ref 7.350–7.450)
pH, Arterial: 7.316 — ABNORMAL LOW (ref 7.350–7.450)
pO2, Arterial: 97 mmHg (ref 83.0–108.0)
pO2, Arterial: 99 mmHg (ref 83.0–108.0)

## 2018-12-02 LAB — MRSA PCR SCREENING: MRSA by PCR: NEGATIVE

## 2018-12-02 LAB — BASIC METABOLIC PANEL
Anion gap: 15 (ref 5–15)
Anion gap: 11 (ref 5–15)
BUN: 59 mg/dL — ABNORMAL HIGH (ref 8–23)
BUN: 69 mg/dL — ABNORMAL HIGH (ref 8–23)
CO2: 26 mmol/L (ref 22–32)
CO2: 28 mmol/L (ref 22–32)
Calcium: 9.1 mg/dL (ref 8.9–10.3)
Calcium: 8.5 mg/dL — ABNORMAL LOW (ref 8.9–10.3)
Chloride: 103 mmol/L (ref 98–111)
Chloride: 103 mmol/L (ref 98–111)
Creatinine, Ser: 2.05 mg/dL — ABNORMAL HIGH (ref 0.44–1.00)
Creatinine, Ser: 2.14 mg/dL — ABNORMAL HIGH (ref 0.44–1.00)
GFR calc Af Amer: 29 mL/min — ABNORMAL LOW (ref >60)
GFR calc Af Amer: 28 mL/min — ABNORMAL LOW (ref >60)
GFR calc non Af Amer: 25 mL/min — ABNORMAL LOW (ref >60)
GFR calc non Af Amer: 24 mL/min — ABNORMAL LOW (ref >60)
Glucose, Bld: 106 mg/dL — ABNORMAL HIGH (ref 70–99)
Glucose, Bld: 145 mg/dL — ABNORMAL HIGH (ref 70–99)
Potassium: 5.5 mmol/L — ABNORMAL HIGH (ref 3.5–5.1)
Potassium: 5 mmol/L (ref 3.5–5.1)
Sodium: 144 mmol/L (ref 135–145)
Sodium: 142 mmol/L (ref 135–145)

## 2018-12-02 LAB — C-REACTIVE PROTEIN: CRP: 5 mg/dL — ABNORMAL HIGH (ref <1.0)

## 2018-12-02 LAB — GLUCOSE, CAPILLARY
Glucose-Capillary: 116 mg/dL — ABNORMAL HIGH (ref 70–99)
Glucose-Capillary: 168 mg/dL — ABNORMAL HIGH (ref 70–99)
Glucose-Capillary: 148 mg/dL — ABNORMAL HIGH (ref 70–99)
Glucose-Capillary: 144 mg/dL — ABNORMAL HIGH (ref 70–99)

## 2018-12-02 LAB — TROPONIN I (HIGH SENSITIVITY)
Troponin I (High Sensitivity): 228 ng/L (ref <18)
Troponin I (High Sensitivity): 206 ng/L (ref <18)

## 2018-12-02 LAB — LACTIC ACID, PLASMA
Lactic Acid, Venous: 1.6 mmol/L (ref 0.5–1.9)
Lactic Acid, Venous: 1.5 mmol/L (ref 0.5–1.9)

## 2018-12-02 LAB — PHOSPHORUS
Phosphorus: 8 mg/dL — ABNORMAL HIGH (ref 2.5–4.6)
Phosphorus: 5.5 mg/dL — ABNORMAL HIGH (ref 2.5–4.6)

## 2018-12-02 LAB — MAGNESIUM
Magnesium: 3 mg/dL — ABNORMAL HIGH (ref 1.7–2.4)
Magnesium: 3.1 mg/dL — ABNORMAL HIGH (ref 1.7–2.4)

## 2018-12-02 LAB — NA AND K (SODIUM & POTASSIUM), RAND UR
Potassium Urine: 64 mmol/L
Sodium, Ur: <10 mmol/L

## 2018-12-02 LAB — OSMOLALITY, URINE: Osmolality, Ur: 464 mOsm/kg (ref 300–900)

## 2018-12-02 LAB — OSMOLALITY: Osmolality: 326 mOsm/kg (ref 275–295)

## 2018-12-02 MED ORDER — SODIUM CHLORIDE 0.9 % IV SOLN
100.0000 mg | INTRAVENOUS | Status: AC
  Administered 2018-12-03 – 2018-12-06 (×4): 100 mg via INTRAVENOUS
  Filled 2018-12-02 (×4): qty 20

## 2018-12-02 MED ORDER — SODIUM CHLORIDE 0.9 % IV SOLN
100.0000 mg | Freq: Once | INTRAVENOUS | Status: AC
  Administered 2018-12-02: 100 mg via INTRAVENOUS
  Filled 2018-12-02: qty 20

## 2018-12-02 MED ORDER — PRO-STAT SUGAR FREE PO LIQD
30.0000 mL | Freq: Two times a day (BID) | ORAL | Status: DC
  Administered 2018-12-02 – 2018-12-03 (×2): 30 mL
  Filled 2018-12-02 (×2): qty 30

## 2018-12-02 MED ORDER — SODIUM ZIRCONIUM CYCLOSILICATE 10 G PO PACK
10.0000 g | PACK | Freq: Once | ORAL | Status: AC
  Administered 2018-12-02: 09:00:00 10 g via ORAL
  Filled 2018-12-02: qty 1

## 2018-12-02 MED ORDER — SODIUM CHLORIDE 0.9 % IV SOLN
INTRAVENOUS | Status: DC | PRN
  Administered 2018-12-02: 250 mL via INTRAVENOUS

## 2018-12-02 MED ORDER — CHLORHEXIDINE GLUCONATE 0.12% ORAL RINSE (MEDLINE KIT): 15.0000 mL | Freq: Two times a day (BID) | OROMUCOSAL | Status: DC

## 2018-12-02 MED ORDER — CHLORHEXIDINE GLUCONATE 0.12 % MT SOLN
15.0000 mL | Freq: Two times a day (BID) | OROMUCOSAL | Status: DC
  Administered 2018-12-02 – 2018-12-13 (×23): 15 mL via OROMUCOSAL
  Filled 2018-12-02 (×12): qty 15

## 2018-12-02 MED ORDER — INSULIN ASPART 100 UNIT/ML ~~LOC~~ SOLN
0.0000 [IU] | SUBCUTANEOUS | Status: DC
  Administered 2018-12-02: 2 [IU] via SUBCUTANEOUS
  Administered 2018-12-02: 3 [IU] via SUBCUTANEOUS
  Administered 2018-12-02: 2 [IU] via SUBCUTANEOUS
  Administered 2018-12-03 (×4): 3 [IU] via SUBCUTANEOUS
  Administered 2018-12-04 (×2): 2 [IU] via SUBCUTANEOUS
  Administered 2018-12-04: 20:00:00 3 [IU] via SUBCUTANEOUS
  Administered 2018-12-04 – 2018-12-05 (×4): 2 [IU] via SUBCUTANEOUS
  Administered 2018-12-05: 5 [IU] via SUBCUTANEOUS
  Administered 2018-12-05 – 2018-12-06 (×2): 2 [IU] via SUBCUTANEOUS
  Administered 2018-12-06 – 2018-12-07 (×4): 3 [IU] via SUBCUTANEOUS
  Administered 2018-12-07 (×3): 2 [IU] via SUBCUTANEOUS
  Administered 2018-12-07: 3 [IU] via SUBCUTANEOUS
  Administered 2018-12-08: 1 [IU] via SUBCUTANEOUS
  Administered 2018-12-08 – 2018-12-10 (×8): 2 [IU] via SUBCUTANEOUS
  Administered 2018-12-10: 21:00:00 3 [IU] via SUBCUTANEOUS
  Administered 2018-12-10 – 2018-12-12 (×7): 2 [IU] via SUBCUTANEOUS
  Administered 2018-12-12 – 2018-12-13 (×5): 3 [IU] via SUBCUTANEOUS

## 2018-12-02 MED ORDER — IPRATROPIUM-ALBUTEROL 20-100 MCG/ACT IN AERS
1.0000 | INHALATION_SPRAY | Freq: Four times a day (QID) | RESPIRATORY_TRACT | Status: DC
  Filled 2018-12-02: qty 4

## 2018-12-02 MED ORDER — DEXAMETHASONE SODIUM PHOSPHATE 10 MG/ML IJ SOLN
6.0000 mg | INTRAMUSCULAR | Status: DC
  Administered 2018-12-02 – 2018-12-05 (×4): 6 mg via INTRAVENOUS
  Filled 2018-12-02 (×4): qty 1

## 2018-12-02 MED ORDER — VITAL HIGH PROTEIN PO LIQD
1000.0000 mL | ORAL | Status: DC
  Administered 2018-12-02: 1000 mL

## 2018-12-02 MED ORDER — ORAL CARE MOUTH RINSE
15.0000 mL | OROMUCOSAL | Status: DC
  Administered 2018-12-02 – 2018-12-13 (×115): 15 mL via OROMUCOSAL

## 2018-12-02 MED ORDER — PANTOPRAZOLE SODIUM 40 MG IV SOLR
40.0000 mg | INTRAVENOUS | Status: DC
  Administered 2018-12-02 – 2018-12-03 (×2): 40 mg via INTRAVENOUS
  Filled 2018-12-02 (×2): qty 40

## 2018-12-02 MED FILL — Medication: Qty: 1 | Status: AC

## 2018-12-02 NOTE — Progress Notes (Signed)
Pt head turned without complications.  

## 2018-12-02 NOTE — Progress Notes (Signed)
A-Line placement unsuccessful.  MD aware.

## 2018-12-02 NOTE — Progress Notes (Signed)
NAME:  Shelley Allison, MRN:  [id number], DOB:  Dec 07, 1955, LOS: 20 ADMISSION DATE:  11/07/2018, CONSULTATION DATE:  10/16 REFERRING MD:  Elvera Lennox, CHIEF COMPLAINT:  Dyspnea   Brief History   63 y/o female admitted on 9/28 with COVID pneumonia, PCCM consulted on 10/16 due to ongoing high oxygen needs.  History of present illness   This is a pleaseant 63 y/o female who was admitted to this facility on 9/28 in the setting of fever, cough with yellow sputum production, dyspnea, and 1 week of nausea vomiting and diarrhea.  She was admitted to [facility].  She was treated with remdesivir, steroids, and Tocilizumab.  She declined convalescent plasma treatment.  She was treated with heated high flow oxygen in the intensive care unit.  She was also treated with antibiotics.  She moved to the intensive care unit on 9/30.  A CT angiogram and bilateral lower extremity duplex ultrasound studies did not show evidence of thromboembolic disease.  She was able to transfer out of the intensive care unit on October 3.  Yesterday she still felt SOB with exertion, comfortable at rest.  Still on 6L a rest.  Lightheaded and dizzy with movement.   10/17 she was found down, encephalopathic, non responsive, with oxygen off.  She had a pulse, was tachypneic to 50s, and her oxygen was off.   She improved with NRB (97% sat, clearly saying name, but still very lethargic and tachypneic to 50s)   Intubated given AMS, tachypnea, need for CT head stat.  CT negative for acute abnormality.  Past Medical History  Hypertension Cerebral artery aneurysm rupture  Significant Hospital Events   9/28 admit 9/30 transfer to ICU, HHF O2 10/3 transfer back to PCU  10/16 needed higher HFNC oxygen flow (15 from 5) in AM.  Improved, was on 5 L on 10/17.  Consults:  PCCM  Procedures:  Arterial line 10/18 Intubation 10/17  Significant Diagnostic Tests:  10/1 vascular ultrasound > negative for lower lobe DVT 10/3 CT  angiogram chest> bilateral groundglass opacification in the upper lobes, some in the lower lobes but sparing of pulmonary parenchyma in bases, no pulmonary embolism.  Micro Data:  9/28 SARS COV 2 > positive  AntimicrobialsCOVID RX:  Remdesivir September 28 through October 2 Solu-Medrol September 28 through October 7 Actemra September 29  Solumedrol 40 BID started Oct 16th -->    Interim history/subjective:  Overnight, resp acidosis, today has improved with very small manipulations to vent overnight.   Objective   Blood pressure 99/63, pulse (!) 106, temperature 98.5 F (36.9 C), temperature source Oral, resp. rate (!) 39, height 5\' 5"  (1.651 m), weight 86.3 kg, SpO2 96 %.    Vent Mode: PRVC FiO2 (%):  [35 %-100 %] 80 % Set Rate:  [28 bmp-35 bmp] 35 bmp Vt Set:  [340 mL-400 mL] 400 mL PEEP:  [10 cmH20] 10 cmH20 Plateau Pressure:  [24 cmH20-38 cmH20] 24 cmH20   Intake/Output Summary (Last 24 hours) at 12/02/2018 0846 Last data filed at 12/02/2018 0800 Gross per 24 hour  Intake 602.25 ml  Output 150 ml  Net 452.25 ml   Filed Weights   11/30/18 0500 12/01/18 0442 12/02/18 0500  Weight: 87.9 kg 87.3 kg 86.3 kg    Examination:  General:  Intubated, sedated.  HENT: NCAT perrl, PULM: Crackles bases B,  CV: RRR, no mgr, sinus tach.  GI: BS+, soft, nontender MSK: normal bulk and tone Neuro: sedated     Resolved Hospital Problem  list    Assessment & Plan:  Acute encephalopathy:  Possibly due to prolonged hypoxia?  Unk how long she was without oxygen.  No other obvious cause.   Lots of coughing and agitation with WUA.     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   Unfortunately had to intubate after episode yesterday.   Echo shows normal LV, pulmonary hypertension suggested by echo, as expected with severity of hypoxemia.   echo: PA pressure 21mmHG, suggesting pulm HTN.   consider a high-resolution CT scan of  the chest when more stable.  No discrete lesions seen on CXR.   Consider inhaled pulm vasodilators?  methylpred 40 BID (since 10/16) Restarting remdesivir today.  Consider secondary bacterial infection: less suspicious of this.  No sputum production, no discrete lesions on CXR. Repeat CXR in am. Check cultures.  Procal SLIGHTLY elevated today.  Recheck in am.      Best practice:   Diet: start tube feeds  Pain/Anxiety/Delirium protocol (if indicated): n/a VAP protocol (if indicated): n/a DVT prophylaxis: lovenox daily GI prophylaxis: protonix 40 daily Glucose control: n/a Mobility: with assistance Code Status: FULL Family Communication: pt only Disposition: stable for transfer to floor at [facility]: Recent Labs  Lab 11/29/18 0040 12/01/18 0027 12/01/18 1850 12/01/18 2000 12/02/18 0142 12/02/18 0628  WBC 10.3 9.3  --  15.3*  --  11.3*  NEUTROABS  --   --   --  12.0*  --   --   HGB 12.1 12.1 13.6 13.5 12.9 12.5  HCT 39.5 38.8 40.0 44.3 38.0 41.4  MCV 90.2 90.0  --  92.1  --  92.8  PLT 255 238  --  267  --  161    Basic Metabolic Panel: Recent Labs  Lab 11/29/18 0040 11/30/18 0122 12/01/18 0027 12/01/18 1850 12/01/18 2000 12/02/18 0142 12/02/18 0150  NA 138 140 140 139 140 141 144  K 4.0 4.1 4.8 4.0 4.8 4.7 5.5*  CL 100 100 101  --  100  --  103  CO2 27 29 29   --  30  --  26  GLUCOSE 108* 141* 110*  --  154*  --  106*  BUN 51* 58* 53*  --  57*  --  59*  CREATININE 1.78* 1.47* 1.30*  --  1.68*  --  2.05*  CALCIUM 8.7* 9.1 8.9  --  9.0  --  9.1   GFR: Estimated Creatinine Clearance: 30.5 mL/min (A) (by C-G formula based on SCr of 2.05 mg/dL (H)). Recent Labs  Lab 11/29/18 0040 12/01/18 0027 12/01/18 2000 12/01/18 2305 12/02/18 0628  PROCALCITON  --   --  0.23  --   --   WBC 10.3 9.3 15.3*  --  11.3*  LATICACIDVEN  --   --  1.3 1.6  --     Liver Function Tests: Recent Labs  Lab 11/29/18 0040 12/01/18 2000  AST 26 29  ALT 41 45*  ALKPHOS  67 88  BILITOT 0.8 0.8  PROT 6.1* 7.7  ALBUMIN 2.7* 3.1*   No results for input(s): LIPASE, AMYLASE in the last 168 hours. No results for input(s): AMMONIA in the last 168 hours.  ABG    Component Value Date/Time   PHART 7.343 (L) 12/02/2018 0142   PCO2ART 59.1 (H) 12/02/2018 0142   PO2ART 97.0 12/02/2018 0142   HCO3 32.2 (H) 12/02/2018 0142   TCO2 34 (H) 12/02/2018 0142  O2SAT 97.0 12/02/2018 0142     Coagulation Profile: No results for input(s): INR, PROTIME in the last 168 hours.  Cardiac Enzymes: No results for input(s): CKTOTAL, CKMB, CKMBINDEX, TROPONINI in the last 168 hours.  HbA1C: Hemoglobin A1C  Date/Time Value Ref Range Status  05/15/2015 03:52 PM 5.5  Final  04/25/2014 04:43 PM 5.8  Final    CBG: Recent Labs  Lab 12/01/18 1842 12/02/18 0755  GLUCAP 218* 116*    Review of Systems:   Unable to assess due to mental status  Past Medical History  She,  has a past medical history of Cerebral aneurysm rupture (HCC) (1999) and Hypertension.   Surgical History    Past Surgical History:  Procedure Laterality Date  . BRAIN SURGERY    . CESAREAN SECTION    . CHOLECYSTECTOMY N/A 04/07/2014   Procedure: LAPAROSCOPIC CHOLECYSTECTOMY;  Surgeon: Frederik SchmidtJay Wyatt, MD;  [facility];  Service: General;  Laterality: N/A;  . HERNIA REPAIR       Social History   reports that she quit smoking about 21 years ago. Her smoking use included cigarettes. She has a 2.50 pack-year smoking history. She has never used smokeless tobacco. She reports that she does not drink alcohol or use drugs.   Family History   Her family history includes Arthritis in her mother.   Allergies Allergies  Allergen Reactions  . Fish Allergy Hives and Shortness Of Breath    All fish and fish derivatives  . Other Hives, Shortness Of Breath and Rash    All Pitted Fruit  . Peanuts [Peanut Oil]   . Shellfish Allergy      Home Medications  Prior to Admission medications   Medication Sig  Start Date End Date Taking? Authorizing Provider  acetaminophen (TYLENOL) 500 MG tablet Take 1,000 mg by mouth every 6 (six) hours as needed for mild pain or headache.   Yes [provider]  amLODipine (NORVASC) 5 MG tablet TAKE 1 TABLET(5 MG) BY MOUTH DAILY Patient taking differently: Take 5 mg by mouth daily. TAKE 1 TABLET(5 MG) BY MOUTH DAILY 11/01/18  Yes Anderson, Chelsey L, DO  EPINEPHrine (EPIPEN) 0.3 mg/0.3 mL DEVI Inject 0.3 mg into the muscle as needed. for anaphylaxis    Yes [provider]  hydrochlorothiazide (HYDRODIURIL) 25 MG tablet TAKE 1 TABLET(25 MG) BY MOUTH DAILY Patient taking differently: Take 25 mg by mouth daily. TAKE 1 TABLET(25 MG) BY MOUTH DAILY 11/01/18  Yes Anderson, Chelsey L, DO  lisinopril (ZESTRIL) 10 MG tablet Take 1 tablet (10 mg total) by mouth daily. 11/01/18  Yes Anderson, Chelsey L, DO     Critical care time:36 minutes.

## 2018-12-02 NOTE — Procedures (Addendum)
Arterial Catheter Insertion Procedure Note HILDEGARDE DUNAWAY [id number] 1955-10-14  Procedure done at 11 AM Procedure: Insertion of Arterial Catheter  Indications: Blood pressure monitoring  Procedure Details Consent: Unable to obtain consent because of altered level of consciousness. Time Out: Verified patient identification, verified procedure, site/side was marked, verified correct patient position, special equipment/implants available, medications/allergies/relevent history reviewed, required imaging and test results available.  Performed  Maximum sterile technique was used including antiseptics, cap, gloves, gown, hand hygiene, mask and sheet. Skin prep: Chlorhexidine; local anesthetic administered 20 gauge catheter was inserted into right radial artery using the Seldinger technique. ULTRASOUND GUIDANCE USED: NO Evaluation Blood flow good; BP tracing good. Complications: No apparent complications.   Collier Bullock 12/02/2018

## 2018-12-02 NOTE — Progress Notes (Signed)
Spoke with patients daughter Brantley Stage when she called. Discussed ventilator, Labs and diagnostics that have been completed, VS, and medications. Spoke to patient via speaker phone. Informed daughter of patients belongings sent to security. Belongings list in chart - included purple pack with 0.60$, drivers license, debit card, and ins cards.

## 2018-12-02 NOTE — Progress Notes (Signed)
PROGRESS NOTE    Shelley Allison  FGH:[id number] DOB: 20-Jun-1955 DOA: 10/26/2018 PCP: Richarda Osmond, DO   Brief Narrative:  Shelley Allison is a 63 y.o. BF PMHx  HTN and cerebral artery aneurysm rupture   Presented to the ED 9/28 with about 1 week of N/V/D followed by progressive shortness of breath with associated fever starting 9/27. Symptoms started around the time of a large wedding she attended. She was found to be hypoxic requiring 6L O2 with positive SARS-CoV-2 testing, elevated inflammatory markers (CRP 20), and reticular CXR infiltrates, possibly left base infiltrate. She was admitted to [facility], started on steroids, and remdesivir 9/28, given tocilizumab on 9/29 when hypoxia worsened. D-dimer has risen from 2.93 to 6.72, so CTA chest is ordered. Due to progressive hypoxia and tachypnea, the patient will be transferred to the ICU 9/30.   Subjective: 10/18 intubated/sedated.  Overnight  CODE BLUE-by the time Dr. Oren Binet  arrived to the patient's room-she was very lethargic but responding.  She was on 100% nonrebreather mask.  Per bedside nurse-patient was not given any narcotics or benzodiazepines.  Bedside nurse reported that when she checked on the patient-patient had removed the oxygen and was very lethargic Intubated overnight   Assessment & Plan:   Principal Problem:   Respiratory tract infection due to COVID-19 virus Active Problems:   OBESITY, NOS   HYPERTENSION, BENIGN SYSTEMIC   CEREBRAL ANEURYSM, RUPTURED   Pneumonia due to COVID-19 virus   Acute respiratory failure with hypoxia ([place])   Acute pulmonary embolus (HCC)   Essential hypertension   AKI (acute kidney injury) ([place])   Morbid obesity with BMI of 70 and over, adult ([place])   CAP (community acquired pneumonia)   Acute respiratory disease due to COVID-19 virus  Acute metabolic encephalopathy -When patient admitted to ICU RR 45, lethargic unresponsive resulted in intubation -Multifactorial hypoxia vs  Covid encephalopathy vs infection vs seizure -10/17 CT head negative acute infarct see results below -Consider EEG  Acute respiratory failure with hypoxia/relapse Covid pneumonia? -10/18 AM PF ratio= 107 -Prone patient 16 hours/day, with 8-hour break -Combivent QID -Covid inflammatory markers -Decadron 6 mg daily x10 days (repeat treatment) -Remdesivir per pharmacy protocol (repeat treatment) -10/2 previously discussed use of Covid convalescent plasma with patient who declined this treatment COVID 19 pneumonia COVID-19 Labs  Recent Labs    12/01/18 0027 12/02/18 0150  DDIMER 2.28*  --   CRP 5.5* 5.0*   Lab Results  Component Value Date   SARSCOV2NAA POSITIVE (A) 10/25/2018  -Strict in and out +2.6 L -Daily weight Filed Weights   11/30/18 0500 12/01/18 0442 12/02/18 0500  Weight: 87.9 kg 87.3 kg 86.3 kg  -10/19 ABG pending  Acute pulmonary embolus/DVT -10/3 CTA chest negative for PE -10/1 bilateral lower extremity Doppler negative for DVT  CAP -Completed 7-day course antibiotics  Demand ischemia -Troponin HS;.0.220/0.Marland Kitchen206   Essential HTN - Amlodipine 10 mg daily (hold) - 10/1 increase Hydralazine 50 mg QID (hold) - Hydralazine IV PRN -10/2 lisinopril 2.5 mg daily (hold) - 10/2 increase Metoprolol 50 mg  BID (hold) -Metoprolol PRN - BP goal<120/80 secondary to Hx ruptured aneurysm.    Acute kidney injury Recent Labs  Lab 11/29/18 0040 11/30/18 0122 12/01/18 0027 12/01/18 2000 12/02/18 0150  CREATININE 1.78* 1.47* 1.30* 1.68* 2.05*  -10/18 have been worsening over the past few days we will consult nephrology in the a.m. -Urine electrolytes, osmolality, serum osmolality, BMP; calculate FeNa  Morbid obesity -BMI 36  -Risk factor  for poor outcome to COVID 19 pneumonia   Hx Ruptured Cerebral Artery aneurysm -Obtain good blood pressure control: Goal BP< 120/80 -See essential HTN  Hyperkalemia -Lokelma 10 g x 1  Hyperglycemia -Moderate SSI   DVT  prophylaxis: Lovenox treatment dose Code Status: Full Family Communication: 10/18 Dr. Marchelle Gearing spoke with family  Disposition Plan: TBD   Consultants:  PCCM    Procedures/Significant Events:  9/28 PCXR; 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 There is atelectatic change in the left base. Early pneumonia in the left base cannot be excluded. -Heart upper normal in size.   10/1 bilateral lower extremity Doppler;-right: Negative DVT/cystic structure found in the popliteal fossa. -Left: Negative DVT/cystic structure found in the popliteal fossa 10/2 PCXR;Diffuse interstitial and alveolar opacities, most confluent in the lung bases, increased since prior study. 10/3 CTA chest PE protocol; negative for PE, positive multifocal pneumonia 10/16 echocardiogram;Left Ventricle: EF= 70 to 75%. The left ventricle has hyperdynamic function.  10/17 PCXR; persistent bilateral pulmonary infiltrates stable 10/17 CT head  Wo contrast: Negative acute intracranial abnormality   I have personally reviewed and interpreted all radiology studies and my findings are as above.  VENTILATOR SETTINGS: Vent mode; PRVC Rate set; 35 Vt Set; 400 FiO2; 50% PEEP; 10      Cultures 9/28 SARS coronavirus positive      Antimicrobials: Anti-infectives (From admission, onward)   Start     Stop   11/13/18 1600  remdesivir 100 mg in sodium chloride 0.9 % 250 mL IVPB     11/16/18 1845   11/13/18 1200  azithromycin (ZITHROMAX) 500 mg in sodium chloride 0.9 % 250 mL IVPB     11/13/18 1340   11/13/18 1200  cefTRIAXone (ROCEPHIN) 1 g in sodium chloride 0.9 % 100 mL IVPB  Status:  Discontinued     11/16/18 1334   10/19/2018 2200  remdesivir 200 mg in sodium chloride 0.9 % 250 mL IVPB     11/05/2018 2213   10/27/2018 1415  cefTRIAXone (ROCEPHIN) 1 g in sodium chloride 0.9 % 100 mL IVPB     11/14/2018 1517   11/07/2018 1415  azithromycin (ZITHROMAX) 500 mg in sodium chloride 0.9 % 250  mL IVPB     10/28/2018 1736       Devices   LINES / TUBES:      Continuous Infusions:  sodium chloride 10 mL/hr at 12/02/18 0600   fentaNYL infusion INTRAVENOUS 150 mcg/hr (12/02/18 0600)   midazolam 3 mg/hr (12/02/18 0600)     Objective: Vitals:   12/02/18 0600 12/02/18 0630 12/02/18 0700 12/02/18 0713  BP: 97/63 (!) 93/59 (!) 90/56   Pulse: (!) 104 (!) 103 (!) 105   Resp: (!) 36 (!) 39 (!) 41 (!) 35  Temp:      TempSrc:      SpO2: 97% 96% 95%   Weight:      Height:        Intake/Output Summary (Last 24 hours) at 12/02/2018 0735 Last data filed at 12/02/2018 0600 Gross per 24 hour  Intake 786.26 ml  Output 150 ml  Net 636.26 ml   Filed Weights   11/30/18 0500 12/01/18 0442 12/02/18 0500  Weight: 87.9 kg 87.3 kg 86.3 kg   Physical Exam:  General: Intubated/sedated positive acute respiratory distress Eyes: negative scleral hemorrhage, negative anisocoria, negative icterus ENT: Negative Runny nose, negative gingival bleeding, #7.5 ETT in place Neck:  Negative scars, masses, torticollis, lymphadenopathy, JVD Lungs: Tachypneic clear to auscultation bilaterally  without wheezes or crackles Cardiovascular: Tachycardic without murmur gallop or rub normal S1 and S2 Abdomen: negative abdominal pain, nondistended, positive soft, bowel sounds, no rebound, no ascites, no appreciable mass Extremities: No significant cyanosis, clubbing, or edema bilateral lower extremities Skin: Negative rashes, lesions, ulcers Psychiatric: Intubated/sedated Central nervous system: Intubated/sedated      Data Reviewed: Care during the described time interval was provided by me .  I have reviewed this patient's available data, including medical history, events of note, physical examination, and all test results as part of my evaluation.   CBC: Recent Labs  Lab 11/29/18 0040 12/01/18 0027 12/01/18 1850 12/01/18 2000 12/02/18 0142  WBC 10.3 9.3  --  15.3*  --   NEUTROABS  --    --   --  12.0*  --   HGB 12.1 12.1 13.6 13.5 12.9  HCT 39.5 38.8 40.0 44.3 38.0  MCV 90.2 90.0  --  92.1  --   PLT 255 238  --  267  --    Basic Metabolic Panel: Recent Labs  Lab 11/29/18 0040 11/30/18 0122 12/01/18 0027 12/01/18 1850 12/01/18 2000 12/02/18 0142 12/02/18 0150  NA 138 140 140 139 140 141 144  K 4.0 4.1 4.8 4.0 4.8 4.7 5.5*  CL 100 100 101  --  100  --  103  CO2 _0 --  30  --  26  GLUCOSE 108* 141* 110*  --  154*  --  106*  BUN 51* 58* 53*  --  57*  --  59*  CREATININE 1.78* 1.47* 1.30*  --  1.68*  --  2.05*  CALCIUM 8.7* 9.1 8.9  --  9.0  --  9.1   GFR: Estimated Creatinine Clearance: 30.5 mL/min (A) (by C-G formula based on SCr of 2.05 mg/dL (H)). Liver Function Tests: Recent Labs  Lab 11/29/18 0040 12/01/18 2000  AST 26 29  ALT 41 45*  ALKPHOS 67 88  BILITOT 0.8 0.8  PROT 6.1* 7.7  ALBUMIN 2.7* 3.1*   No results for input(s): LIPASE, AMYLASE in the last 168 hours. No results for input(s): AMMONIA in the last 168 hours. Coagulation Profile: No results for input(s): INR, PROTIME in the last 168 hours. Cardiac Enzymes: No results for input(s): CKTOTAL, CKMB, CKMBINDEX, TROPONINI in the last 168 hours. BNP (last 3 results) No results for input(s): PROBNP in the last 8760 hours. HbA1C: No results for input(s): HGBA1C in the last 72 hours. CBG: Recent Labs  Lab 12/01/18 1842  GLUCAP 218*   Lipid Profile: No results for input(s): CHOL, HDL, LDLCALC, TRIG, CHOLHDL, LDLDIRECT in the last 72 hours. Thyroid Function Tests: No results for input(s): TSH, T4TOTAL, FREET4, T3FREE, THYROIDAB in the last 72 hours. Anemia Panel: No results for input(s): VITAMINB12, FOLATE, FERRITIN, TIBC, IRON, RETICCTPCT in the last 72 hours. Urine analysis:    Component Value Date/Time   COLORURINE YELLOW 11/15/2018 2215   APPEARANCEUR HAZY (A) 11/15/2018 2215   LABSPEC 1.018 11/15/2018 2215   PHURINE 6.0 11/15/2018 2215   GLUCOSEU NEGATIVE 11/15/2018 2215     HGBUR NEGATIVE 11/15/2018 2215   HGBUR trace-intact 11/14/2006 0829   BILIRUBINUR NEGATIVE 11/15/2018 2215   KETONESUR NEGATIVE 11/15/2018 2215   PROTEINUR NEGATIVE 11/15/2018 2215   UROBILINOGEN 1.0 04/06/2014 1816   NITRITE NEGATIVE 11/15/2018 2215   LEUKOCYTESUR NEGATIVE 11/15/2018 2215   Sepsis Labs: _1 (procalcitonin:4,lacticidven:4)  ) No results found for this or any previous visit (from the past 240 hour(s)).  Radiology Studies: Dg Abd 1 View  Result Date: 12/01/2018 CLINICAL DATA:  Orogastric tube placement. EXAM: ABDOMEN - 1 VIEW COMPARISON:  None. FINDINGS: Tip and side port of the enteric tube below the diaphragm in the stomach. No dilated bowel loops to suggest obstruction. No evidence of free air. Cholecystectomy clips in the right upper quadrant. Bilateral pulmonary opacities as seen on radiograph earlier today. IMPRESSION: Tip and side port of the enteric tube below the diaphragm in the stomach. Electronically Signed   By: Keith Rake M.D.   On: 12/01/2018 23:30   Ct Head Wo Contrast  Result Date: 12/01/2018 CLINICAL DATA:  Altered level of consciousness. Found unresponsive in the bathroom. History of ruptured cerebral aneurysm. EXAM: CT HEAD WITHOUT CONTRAST TECHNIQUE: Contiguous axial images were obtained from the base of the skull through the vertex without intravenous contrast. COMPARISON:  Head CT 04/15/2015 FINDINGS: Brain: No evidence of acute hemorrhage, no subarachnoid or intraventricular blood. No subdural hemorrhage. No evidence of acute ischemia. No hydrocephalus, midline shift or mass effect. Encephalomalacia in the left temporal lobe subjacent to craniotomy defect, unchanged. 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 Vascular: Left aneurysm clipping, unchanged. No evidence of hyperdense vessel, streak artifact from aneurysm clip partially obscures evaluation of the left MCA. Skull: Left frontotemporal craniotomy. No  fracture or focal lesion. Sinuses/Orbits: No acute finding. Other: None. IMPRESSION: 1. No acute intracranial abnormality. 2. Stable postsurgical change from left-sided aneurysm clipping. Electronically Signed   By: Keith Rake M.D.   On: 12/01/2018 21:42   Dg Chest Port 1 View  Result Date: 12/01/2018 CLINICAL DATA:  Intubation.  COVID-19. EXAM: PORTABLE CHEST 1 VIEW COMPARISON:  December 01, 2018 FINDINGS: The ETT terminates 15 mm above the carina. Bilateral pulmonary infiltrates are stable. No other changes. IMPRESSION: 1. The ETT terminates 1.5 cm above the carina. Recommend withdrawing 1 cm. 2. Persistent bilateral pulmonary infiltrates. Electronically Signed   By: Dorise Bullion III M.D   On: 12/01/2018 20:14   Dg Chest Port 1 View  Result Date: 11/30/2018 CLINICAL DATA:  COVID-19, shortness of breath EXAM: PORTABLE CHEST 1 VIEW COMPARISON:  11/26/2018 FINDINGS: No significant interval change in AP portable examination with extensive, diffuse heterogeneous and interstitial pulmonary opacity, most dense and consolidative appearing in the left mid lung and peripheral right upper lobe. No new opacity. The heart and mediastinum are unremarkable. IMPRESSION: No significant interval change in AP portable examination with extensive, diffuse heterogeneous and interstitial pulmonary opacity, most dense and consolidative appearing in the left mid lung and peripheral right upper lobe. Findings are consistent with COVID-19 infection. No new opacity. Electronically Signed   By: Eddie Candle M.D.   On: 11/30/2018 12:50   Dg Chest Port 1v Same Day  Result Date: 12/01/2018 CLINICAL DATA:  Progressive hypoxia. COVID-19. Respiratory distress. EXAM: PORTABLE CHEST 1 VIEW COMPARISON:  November 30, 2018 FINDINGS: Bilateral diffuse pulmonary infiltrates are stable. The cardiomediastinal silhouette is stable. No other acute abnormalities. IMPRESSION: Stable bilateral pulmonary infiltrates.  No change.  Electronically Signed   By: Dorise Bullion III M.D   On: 12/01/2018 18:56        Scheduled Meds:  albuterol  2.5 mg Nebulization Q6H   chlorhexidine gluconate (MEDLINE KIT)  15 mL Mouth Rinse BID   Chlorhexidine Gluconate Cloth  6 each Topical Daily   enoxaparin (LOVENOX) injection  50 mg Subcutaneous Q24H   feeding supplement (ENSURE ENLIVE)  237 mL Oral TID BM   fentaNYL (SUBLIMAZE) injection  100 mcg  Intravenous Once   fentaNYL (SUBLIMAZE) injection  50 mcg Intravenous Once   mouth rinse  15 mL Mouth Rinse 10 times per day   methylPREDNISolone (SOLU-MEDROL) injection  40 mg Intravenous Q12H   Continuous Infusions:  sodium chloride 10 mL/hr at 12/02/18 0600   fentaNYL infusion INTRAVENOUS 150 mcg/hr (12/02/18 0600)   midazolam 3 mg/hr (12/02/18 0600)     LOS: 20 days   The patient is critically ill with multiple organ systems failure and requires high complexity decision making for assessment and support, frequent evaluation and titration of therapies, application of advanced monitoring technologies and extensive interpretation of multiple databases. Critical Care Time devoted to patient care services described in this note  Time spent: 40 minutes     Iline Buchinger, Geraldo Docker, MD [facility] Pager [phone]  If 7PM-7AM, please contact night-coverage [email or web address] Password [email or web address] 12/02/2018, 7:35 AM

## 2018-12-02 NOTE — Progress Notes (Signed)
Gave update to daughter and mother of patient. Mother and daughter are both listed in the emergency contact list. Consent was obtained over phone for arterial line placement. Patient's mother and daughter understood the reasoning for the arterial line as well as the risk. This was verified by Ronie Spies. RN.

## 2018-12-03 ENCOUNTER — Inpatient Hospital Stay (HOSPITAL_COMMUNITY): Payer: BC Managed Care – PPO

## 2018-12-03 ENCOUNTER — Inpatient Hospital Stay (HOSPITAL_COMMUNITY)
Admit: 2018-12-03 | Discharge: 2018-12-03 | Disposition: A | Discharge status: Home / Self Care | Payer: BC Managed Care – PPO | Attending: Pulmonary Disease | Admitting: Pulmonary Disease

## 2018-12-03 DIAGNOSIS — I609 Nontraumatic subarachnoid hemorrhage, unspecified: Secondary | ICD-10-CM

## 2018-12-03 LAB — CBC WITH DIFFERENTIAL/PLATELET
Abs Immature Granulocytes: 0.04 10*3/uL (ref 0.00–0.07)
Basophils Absolute: 0 10*3/uL (ref 0.0–0.1)
Basophils Relative: 0 %
Eosinophils Absolute: 0 10*3/uL (ref 0.0–0.5)
Eosinophils Relative: 0 %
HCT: 37.7 % (ref 36.0–46.0)
Hemoglobin: 11.3 g/dL — ABNORMAL LOW (ref 12.0–15.0)
Immature Granulocytes: 0 %
Lymphocytes Relative: 6 %
Lymphs Abs: 0.6 10*3/uL — ABNORMAL LOW (ref 0.7–4.0)
MCH: 28 pg (ref 26.0–34.0)
MCHC: 30 g/dL (ref 30.0–36.0)
MCV: 93.3 fL (ref 80.0–100.0)
Monocytes Absolute: 0.8 10*3/uL (ref 0.1–1.0)
Monocytes Relative: 7 %
Neutro Abs: 9.3 10*3/uL — ABNORMAL HIGH (ref 1.7–7.7)
Neutrophils Relative %: 87 %
Platelets: 229 10*3/uL (ref 150–400)
RBC: 4.04 MIL/uL (ref 3.87–5.11)
RDW: 14.8 % (ref 11.5–15.5)
WBC: 10.7 10*3/uL — ABNORMAL HIGH (ref 4.0–10.5)
nRBC: 0 % (ref 0.0–0.2)

## 2018-12-03 LAB — GLUCOSE, CAPILLARY
Glucose-Capillary: 113 mg/dL — ABNORMAL HIGH (ref 70–99)
Glucose-Capillary: 118 mg/dL — ABNORMAL HIGH (ref 70–99)
Glucose-Capillary: 119 mg/dL — ABNORMAL HIGH (ref 70–99)
Glucose-Capillary: 136 mg/dL — ABNORMAL HIGH (ref 70–99)
Glucose-Capillary: 160 mg/dL — ABNORMAL HIGH (ref 70–99)
Glucose-Capillary: 185 mg/dL — ABNORMAL HIGH (ref 70–99)
Glucose-Capillary: 188 mg/dL — ABNORMAL HIGH (ref 70–99)

## 2018-12-03 LAB — POCT I-STAT 7, (LYTES, BLD GAS, ICA,H+H)
Acid-Base Excess: 5 mmol/L — ABNORMAL HIGH (ref 0.0–2.0)
Bicarbonate: 32.4 mmol/L — ABNORMAL HIGH (ref 20.0–28.0)
Calcium, Ion: 1.28 mmol/L (ref 1.15–1.40)
Calcium, Ion: 1.27 mmol/L (ref 1.15–1.40)
Calcium, Ion: 1.21 mmol/L (ref 1.15–1.40)
HCT: 43 % (ref 36.0–46.0)
HCT: 42 % (ref 36.0–46.0)
HCT: 34 % — ABNORMAL LOW (ref 36.0–46.0)
Hemoglobin: 14.6 g/dL (ref 12.0–15.0)
Hemoglobin: 14.3 g/dL (ref 12.0–15.0)
Hemoglobin: 11.6 g/dL — ABNORMAL LOW (ref 12.0–15.0)
O2 Saturation: 89 %
Patient temperature: 99.2
Patient temperature: 98.2
Patient temperature: 98.6
Potassium: 4.6 mmol/L (ref 3.5–5.1)
Potassium: 5.1 mmol/L (ref 3.5–5.1)
Potassium: 4.2 mmol/L (ref 3.5–5.1)
Sodium: 140 mmol/L (ref 135–145)
Sodium: 140 mmol/L (ref 135–145)
Sodium: 144 mmol/L (ref 135–145)
TCO2: 34 mmol/L — ABNORMAL HIGH (ref 22–32)
pCO2 arterial: >97 mmHg (ref 32.0–48.0)
pCO2 arterial: >97 mmHg (ref 32.0–48.0)
pCO2 arterial: 63.8 mmHg — ABNORMAL HIGH (ref 32.0–48.0)
pH, Arterial: 7.101 — CL (ref 7.350–7.450)
pH, Arterial: 7.147 — CL (ref 7.350–7.450)
pH, Arterial: 7.314 — ABNORMAL LOW (ref 7.350–7.450)
pO2, Arterial: 145 mmHg — ABNORMAL HIGH (ref 83.0–108.0)
pO2, Arterial: 110 mmHg — ABNORMAL HIGH (ref 83.0–108.0)
pO2, Arterial: 63 mmHg — ABNORMAL LOW (ref 83.0–108.0)

## 2018-12-03 LAB — COMPREHENSIVE METABOLIC PANEL
ALT: 40 U/L (ref 0–44)
AST: 19 U/L (ref 15–41)
Albumin: 2.6 g/dL — ABNORMAL LOW (ref 3.5–5.0)
Alkaline Phosphatase: 71 U/L (ref 38–126)
Anion gap: 10 (ref 5–15)
BUN: 72 mg/dL — ABNORMAL HIGH (ref 8–23)
CO2: 29 mmol/L (ref 22–32)
Calcium: 8.6 mg/dL — ABNORMAL LOW (ref 8.9–10.3)
Chloride: 103 mmol/L (ref 98–111)
Creatinine, Ser: 1.99 mg/dL — ABNORMAL HIGH (ref 0.44–1.00)
GFR calc Af Amer: 30 mL/min — ABNORMAL LOW (ref >60)
GFR calc non Af Amer: 26 mL/min — ABNORMAL LOW (ref >60)
Glucose, Bld: 147 mg/dL — ABNORMAL HIGH (ref 70–99)
Potassium: 4.5 mmol/L (ref 3.5–5.1)
Sodium: 142 mmol/L (ref 135–145)
Total Bilirubin: 0.3 mg/dL (ref 0.3–1.2)
Total Protein: 6.8 g/dL (ref 6.5–8.1)

## 2018-12-03 LAB — URINE CULTURE: Culture: NO GROWTH

## 2018-12-03 LAB — HEMOGLOBIN A1C
Hgb A1c MFr Bld: 6.4 % — ABNORMAL HIGH (ref 4.8–5.6)
Mean Plasma Glucose: 137 mg/dL

## 2018-12-03 LAB — C-REACTIVE PROTEIN: CRP: 9.8 mg/dL — ABNORMAL HIGH (ref <1.0)

## 2018-12-03 LAB — MAGNESIUM
Magnesium: 3.2 mg/dL — ABNORMAL HIGH (ref 1.7–2.4)
Magnesium: 2.9 mg/dL — ABNORMAL HIGH (ref 1.7–2.4)

## 2018-12-03 LAB — PHOSPHORUS
Phosphorus: 4.6 mg/dL (ref 2.5–4.6)
Phosphorus: 4.1 mg/dL (ref 2.5–4.6)

## 2018-12-03 LAB — PROCALCITONIN: Procalcitonin: 0.42 ng/mL

## 2018-12-03 LAB — D-DIMER, QUANTITATIVE: D-Dimer, Quant: 2.24 ug/mL-FEU — ABNORMAL HIGH (ref 0.00–0.50)

## 2018-12-03 MED ORDER — PRO-STAT SUGAR FREE PO LIQD
60.0000 mL | Freq: Three times a day (TID) | ORAL | Status: DC
  Administered 2018-12-03 – 2018-12-13 (×29): 60 mL
  Filled 2018-12-03 (×29): qty 60

## 2018-12-03 MED ORDER — ENOXAPARIN SODIUM 40 MG/0.4ML ~~LOC~~ SOLN
40.0000 mg | Freq: Two times a day (BID) | SUBCUTANEOUS | Status: DC
  Administered 2018-12-03 – 2018-12-07 (×9): 40 mg via SUBCUTANEOUS
  Filled 2018-12-03 (×9): qty 0.4

## 2018-12-03 MED ORDER — PANTOPRAZOLE SODIUM 40 MG PO PACK
40.0000 mg | PACK | Freq: Every day | ORAL | Status: DC
  Administered 2018-12-04 – 2018-12-13 (×10): 40 mg
  Filled 2018-12-03 (×10): qty 20

## 2018-12-03 MED ORDER — ALBUTEROL SULFATE (2.5 MG/3ML) 0.083% IN NEBU: 2.5000 mg | INHALATION_SOLUTION | RESPIRATORY_TRACT | Status: DC | PRN

## 2018-12-03 MED ORDER — ACETAMINOPHEN 325 MG PO TABS
650.0000 mg | ORAL_TABLET | Freq: Four times a day (QID) | ORAL | Status: DC | PRN
  Administered 2018-12-08 – 2018-12-12 (×4): 650 mg
  Filled 2018-12-03 (×4): qty 2

## 2018-12-03 MED ORDER — VITAL 1.5 CAL PO LIQD
1000.0000 mL | ORAL | Status: DC
  Administered 2018-12-03: 1000 mL
  Filled 2018-12-03 (×4): qty 1000

## 2018-12-03 NOTE — Progress Notes (Signed)
Video call completed with daughter.

## 2018-12-03 NOTE — Progress Notes (Signed)
NAME:  Shelley Allison, MRN:  [id number], DOB:  [birth year], LOS: 21 ADMISSION DATE:  12/08/2018, CONSULTATION DATE:  10/16 REFERRING MD:  Elvera Lennox, CHIEF COMPLAINT:  Dyspnea   Brief History   63 yo female presented to ER 08-Dec-2018 with weakness, n/v/d x one week after attending a wedding.  Found to have hypoxia, fever, productive cough from COVID pneumonia.   Past Medical History  Hypertension Cerebral artery aneurysm rupture  Significant Hospital Events   09/28 admit, start decadron/remdesivir 09/29 given tocilizumab in setting of worsening hypoxia; pt deferred option for convalescent plasma 09/30 transfer to ICU, PCCM consulted 10/03 transfer back to PCU 10/16 PCCM reconsulted for presistent hypoxia; restart solumedrol  10/17 transferred back to ICU with AMS and hypoxia, required intubation 10/18 restart remdesivir  Consults:    Procedures:  ETT 10/17 >> Rt radial a line 10/18 >>   Significant Diagnostic Tests:  Doppler b/l legs 10/01 >> no DVT CT angio chest 10/03 >> borderline LAN up to 1.5 cm, b/l patchy consolidation and GGO Echo 10/16 >> EF 70 to 75%, PAS 50 mmHg CT head 10/17 >> s/p Lt frontotemporal craniotomy, encephalomalacia Lt temporal lobe, chronic small posterior temporal cortical infarct, Lt aneurysm clipping  Micro Data:  SARS COV2 9/28 >> POSITIVE Sputum 10/18 >> Urine 10/18 >>  AntimicrobialsCOVID RX:  Remdesivir 9/28 >> 10/02 Solumedrol 9/28 >> 10/07 Tocilizumab 9/29  Solumedrol 10/16 >> 10/18 Remdesivir 10/18 >>  Decadron 10/18 >>   Interim history/subjective:  PaO2:FiO2 ratio 126.  On 50% FiO2.  Increased RR with WUA.  Objective   Blood pressure (!) 91/57, pulse 98, temperature 98.6 F (37 C), temperature source Oral, resp. rate (!) 33, height 5\' 5"  (1.651 m), weight 92.1 kg, SpO2 95 %.    Vent Mode: PRVC FiO2 (%):  [50 %-80 %] 50 % Set Rate:  [35 bmp] 35 bmp Vt Set:  [400 mL] 400 mL PEEP:  [10 cmH20] 10 cmH20 Plateau Pressure:  [24  cmH20-36 cmH20] 35 cmH20   Intake/Output Summary (Last 24 hours) at 12/03/2018 0947 Last data filed at 12/03/2018 0800 Gross per 24 hour  Intake 1575.88 ml  Output 960 ml  Net 615.88 ml   Filed Weights   12/01/18 0442 12/02/18 0500 12/03/18 0500  Weight: 87.3 kg 86.3 kg 92.1 kg    Examination:  General - sedated Eyes - pupils reactive ENT - ETT in place Cardiac - regular rate/rhythm, no murmur Chest - b/l crackles, no wheeze Abdomen - soft, non tender, + bowel sounds Extremities - 1+ edema Skin - no rashes Neuro - RASS -2  CXR (reviewed by me) - b/l patchy ASD   Resolved Hospital Problem list     Assessment & Plan:   Acute hypoxic/hypercapnic respiratory failure in setting of COVID 19 PNA and altered mental status 10/17. - ? If she could have aspirated on 10/17; if she develops fever or increasing WBC, then consider adding ABx - restarted steroids 10/16 with ?steroid responsive pulmonary infiltrates - goal SpO2 88 to 95% - f/u CXR - restarted remdesivir 10/18 >> plan for 5 days tx - change BDs to prn  Acute metabolic encephalopathy. - likely related to hypoxia - CT head 10/17 unrevealing for acute findings - RASS goal 0 to -1  CKD 3. - monitor BMET, urine outpt  Hx of HTN. - negative fluid balance as able >> defer diuresis to hospitalist team  Steroid induced hyperglycemia. - SSI   Best practice:  Diet: tube feeds DVT prophylaxis: lovenox  GI prophylaxis: protonix Mobility: bed rest Code Status: full code Disposition: ICU  Labs    CMP Latest Ref Rng & Units 12/03/2018 12/03/2018 12/02/2018  Glucose 70 - 99 mg/dL - 147(H) 145(H)  BUN 8 - 23 mg/dL - 72(H) 69(H)  Creatinine 0.44 - 1.00 mg/dL - 1.99(H) 2.14(H)  Sodium 135 - 145 mmol/L 144 142 142  Potassium 3.5 - 5.1 mmol/L 4.2 4.5 5.0  Chloride 98 - 111 mmol/L - 103 103  CO2 22 - 32 mmol/L - 29 28  Calcium 8.9 - 10.3 mg/dL - 8.6(L) 8.5(L)  Total Protein 6.5 - 8.1 g/dL - 6.8 -  Total Bilirubin  0.3 - 1.2 mg/dL - 0.3 -  Alkaline Phos 38 - 126 U/L - 71 -  AST 15 - 41 U/L - 19 -  ALT 0 - 44 U/L - 40 -   CBC Latest Ref Rng & Units 12/03/2018 12/03/2018 12/02/2018  WBC 4.0 - 10.5 K/uL - 10.7(H) -  Hemoglobin 12.0 - 15.0 g/dL 11.6(L) 11.3(L) 15.3(H)  Hematocrit 36.0 - 46.0 % 34.0(L) 37.7 45.0  Platelets 150 - 400 K/uL - 229 -   ABG    Component Value Date/Time   PHART 7.314 (L) 12/03/2018 0805   PCO2ART 63.8 (H) 12/03/2018 0805   PO2ART 63.0 (L) 12/03/2018 0805   HCO3 32.4 (H) 12/03/2018 0805   TCO2 34 (H) 12/03/2018 0805   O2SAT 89.0 12/03/2018 0805   CBG (last 3)  Recent Labs    12/03/18 0037 12/03/18 0321 12/03/18 0800  GLUCAP 185* 188* 113*    D/w Dr. Sherral Hammers  CC time 63 minutes  Chesley Mires, MD [place] 12/03/2018, 10:17 AM

## 2018-12-03 NOTE — Plan of Care (Signed)

## 2018-12-03 NOTE — Progress Notes (Signed)
SW daughter and updated on POC

## 2018-12-03 NOTE — Progress Notes (Signed)
Nutrition Follow-up RD working remotely.  DOCUMENTATION CODES:   Not applicable  INTERVENTION:   D/C Vital High Protein  Vital 1.5 @ 35 ml/hr via OG tube 60 ml Prostat TID  Provides: 1860 kcal, 146 grams protein, and 641 ml free water.   NUTRITION DIAGNOSIS:   Increased nutrient needs related to acute illness as evidenced by estimated needs.  Ongoing   GOAL:   Patient will meet greater than or equal to 90% of their needs  Progressing  MONITOR:   PO intake, Supplement acceptance  ASSESSMENT:   63 yo female admitted with N/V/D and SOB which began on 9/27 after attending a big wedding. COVID-19 positive in the ED. PMH includes HTN, cerebral artery aneurysm rupture.   Per RD previous notes pt with poor intake this admission.  Per RN report pt is awake and following commands.  Spoke with CCM, ok to advance TF today.  9/28 admitted 10/17 intubated  Patient is currently intubated on ventilator support MV: 13.6 L/min Temp (24hrs), Avg:98.1 F (36.7 C), Min:97.7 F (36.5 C), Max:98.6 F (37 C)   Labs reviewed. Medications reviewed. 11 F OG tube   TF: Vital High Protein @ 20 ml/hr with 30 ml Prostat BID. Provides: 680 kcal and 72 grams protein.   Diet Order:   Diet Order    None      EDUCATION NEEDS:   Not appropriate for education at this time  Skin:  Skin Assessment: Reviewed RN Assessment  Last BM:  10/15  Height:   Ht Readings from Last 1 Encounters:  12/03/18 5\' 5"  (1.651 m)    Weight:   Wt Readings from Last 1 Encounters:  12/03/18 92.1 kg    Ideal Body Weight:  56.8 kg  BMI:  Body mass index is 33.79 kg/m.  Estimated Nutritional Needs:   Kcal:  2000-2200  Protein:  115-130 gm  Fluid:  >/= 1.8 L  Maylon Peppers RD, LDN, CNSC [phone] Pager [phone] After Hours Pager

## 2018-12-03 NOTE — Progress Notes (Signed)
PROGRESS NOTE    Shelley Allison  KNL:[id number] DOB: [birth year] DOA: 11-22-18 PCP: Richarda Osmond, DO   Brief Narrative:  Shelley Allison is a 63 y.o. BF PMHx  HTN and cerebral artery aneurysm rupture   Presented to the ED 9/28 with about 1 week of N/V/D followed by progressive shortness of breath with associated fever starting 9/27. Symptoms started around the time of a large wedding she attended. She was found to be hypoxic requiring 6L O2 with positive SARS-CoV-2 testing, elevated inflammatory markers (CRP 20), and reticular CXR infiltrates, possibly left base infiltrate. She was admitted to [facility], started on steroids, and remdesivir 9/28, given tocilizumab on 9/29 when hypoxia worsened. D-dimer has risen from 2.93 to 6.72, so CTA chest is ordered. Due to progressive hypoxia and tachypnea, the patient will be transferred to the ICU 9/30.   Subjective: 10/19 intubated/sedated.  Patient was proned overnight.    Assessment & Plan:   Principal Problem:   Respiratory tract infection due to COVID-19 virus Active Problems:   OBESITY, NOS   HYPERTENSION, BENIGN SYSTEMIC   CEREBRAL ANEURYSM, RUPTURED   Pneumonia due to COVID-19 virus   Acute respiratory failure with hypoxia (HCC)   Acute pulmonary embolus (HCC)   Essential hypertension   AKI (acute kidney injury) ([place])   Morbid obesity with BMI of 70 and over, adult ([place])   CAP (community acquired pneumonia)   Acute respiratory disease due to COVID-19 virus  Acute metabolic encephalopathy -When patient admitted to ICU RR 45, lethargic unresponsive resulted in intubation -Multifactorial hypoxia vs Covid encephalopathy vs infection vs seizure -10/17 CT head negative acute infarct see results below -Procalcitonin.  Therefore of infection less likely cause -Unsure of how long patient was down without oxygen, hypoxic brain injury? -Since most likely not secondary to a bacterial superinfection and head CT negative for acute CVA,  will obtain EEG.  Acute respiratory failure with hypoxia/relapse Covid pneumonia? -10/18 AM PF ratio= 107 -Prone patient 16 hours/day, with 8-hour break -Combivent QID -Covid inflammatory markers -Decadron 6 mg daily x10 days (repeat treatment) -Remdesivir per pharmacy protocol (repeat treatment) -10/2 previously discussed use of Covid convalescent plasma with patient who declined this treatment COVID 19 pneumonia COVID-19 Labs  Recent Labs    12/01/18 0027 12/02/18 0150  DDIMER 2.28*  --   CRP 5.5* 5.0*   Lab Results  Component Value Date   SARSCOV2NAA POSITIVE (A) 11-22-2018  -Strict in and out +2.6 L -Daily weight Filed Weights   12/01/18 0442 12/02/18 0500 12/03/18 0500  Weight: 87.3 kg 86.3 kg 92.1 kg  -10/19 ABG pending  -Procalcitonin 10/17= 0.23, 10/19= 0.42.  Not consistent with bacterial infection would not start antibiotics decreased respiratory status most likely not secondary to aspiration -10/19 PF ratio= 126.  Will hold off on proning today since some improvement. -Continue daily weaning trials -10/19 attempted to decrease sedation patient did not tolerate became agitated/vent dyssynchrony  Acute pulmonary embolus/DVT -10/3 CTA chest negative for PE -10/1 bilateral lower extremity Doppler negative for DVT  CAP -Completed 7-day course antibiotics  Demand ischemia -Troponin HS;.0.220/0.Marland Kitchen206   Essential HTN - Amlodipine 10 mg daily (hold) - 10/1 increase Hydralazine 50 mg QID (hold) - Hydralazine IV PRN -10/2 lisinopril 2.5 mg daily (hold) - 10/2 increase Metoprolol 50 mg  BID (hold) -Metoprolol PRN - BP goal<120/80 secondary to Hx ruptured aneurysm.    Acute kidney injury Recent Labs  Lab 12/01/18 0027 12/01/18 2000 12/02/18 0150 12/02/18 1820 12/03/18 0509  CREATININE  1.30* 1.68* 2.05* 2.14* 1.99*  -10/18 have been worsening over the past few days we will consult nephrology in the a.m. -Urine electrolytes, osmolality, serum osmolality,  BMP; calculate FeNa  Morbid obesity -BMI 36  -Risk factor for poor outcome to COVID 19 pneumonia   Hx Ruptured Cerebral Artery aneurysm -Obtain good blood pressure control: Goal BP< 120/80 -See essential HTN  Hyperkalemia -Resolved  Hyperglycemia -Moderate SSI   DVT prophylaxis: Lovenox treatment dose Code Status: Full Family Communication: 10/19 Dr. Coralyn Helling spoke with family  Disposition Plan: TBD   Consultants:  PCCM    Procedures/Significant Events:  9/28 PCXR; 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 There is atelectatic change in the left base. Early pneumonia in the left base cannot be excluded. -Heart upper normal in size.   10/1 bilateral lower extremity Doppler;-right: Negative DVT/cystic structure found in the popliteal fossa. -Left: Negative DVT/cystic structure found in the popliteal fossa 10/2 PCXR;Diffuse interstitial and alveolar opacities, most confluent in the lung bases, increased since prior study. 10/3 CTA chest PE protocol; negative for PE, positive multifocal pneumonia 10/16 echocardiogram;Left Ventricle: EF= 70 to 75%. The left ventricle has hyperdynamic function.  10/17 PCXR; persistent bilateral pulmonary infiltrates stable 10/17 CT head  Wo contrast: Negative acute intracranial abnormality   I have personally reviewed and interpreted all radiology studies and my findings are as above.  VENTILATOR SETTINGS: Vent mode; PRVC Rate set; 35 Vt Set; 400 FiO2; 50% PEEP; 10      Cultures 9/28 SARS coronavirus positive      Antimicrobials: Anti-infectives (From admission, onward)   Start     Stop   12/03/18 1600  remdesivir 100 mg in sodium chloride 0.9 % 250 mL IVPB     12/07/18 1559   12/02/18 2030  remdesivir 100 mg in sodium chloride 0.9 % 250 mL IVPB     12/03/18 0817   11/13/18 1600  remdesivir 100 mg in sodium chloride 0.9 % 250 mL IVPB     11/16/18 1845   11/13/18 1200  azithromycin  (ZITHROMAX) 500 mg in sodium chloride 0.9 % 250 mL IVPB     11/13/18 1340   11/13/18 1200  cefTRIAXone (ROCEPHIN) 1 g in sodium chloride 0.9 % 100 mL IVPB  Status:  Discontinued     11/16/18 1334   11/13/18 2200  remdesivir 200 mg in sodium chloride 0.9 % 250 mL IVPB     11/13/2018 2213   2018-11-13 1415  cefTRIAXone (ROCEPHIN) 1 g in sodium chloride 0.9 % 100 mL IVPB     11/13/18 1517   2018-11-13 1415  azithromycin (ZITHROMAX) 500 mg in sodium chloride 0.9 % 250 mL IVPB     11/13/2018 1736       Devices   LINES / TUBES:      Continuous Infusions:  sodium chloride 10 mL/hr at 12/03/18 0700   sodium chloride 10 mL/hr at 12/03/18 0700   fentaNYL infusion INTRAVENOUS 175 mcg/hr (12/03/18 0700)   midazolam 6 mg/hr (12/03/18 0700)   remdesivir 100 mg in NS 250 mL       Objective: Vitals:   12/03/18 0500 12/03/18 0559 12/03/18 0600 12/03/18 0749  BP:    136/62  Pulse: (!) 108  (!) 103 98  Resp: 20  (!) 30 (!) 39  Temp:      TempSrc:      SpO2: 94% 96% 95% 95%  Weight: 92.1 kg     Height:  Intake/Output Summary (Last 24 hours) at 12/03/2018 0755 Last data filed at 12/03/2018 0700 Gross per 24 hour  Intake 1728.19 ml  Output 960 ml  Net 768.19 ml   Filed Weights   12/01/18 0442 12/02/18 0500 12/03/18 0500  Weight: 87.3 kg 86.3 kg 92.1 kg   Physical Exam:  General: Intubated/sedated, positive acute respiratory distress Eyes: negative scleral hemorrhage, negative anisocoria, negative icterus ENT: Negative Runny nose, negative gingival bleeding, #7.5 ETT in place Neck:  Negative scars, masses, torticollis, lymphadenopathy, JVD Lungs: Diffuse coarse breath sounds, bilaterally without wheezes or crackles Cardiovascular: Regular rate and rhythm without murmur gallop or rub normal S1 and S2 Abdomen: negative abdominal pain, nondistended, hypoactive soft, bowel sounds, no rebound, no ascites, no appreciable mass Extremities: No significant cyanosis, clubbing, or  edema bilateral lower extremities Skin: Negative rashes, lesions, ulcers Psychiatric: Intubated/sedated Central nervous system: Intubated/sedated     Data Reviewed: Care during the described time interval was provided by me .  I have reviewed this patient's available data, including medical history, events of note, physical examination, and all test results as part of my evaluation.   CBC: Recent Labs  Lab 11/29/18 0040 12/01/18 0027  12/01/18 2000 12/02/18 0142 12/02/18 0628 12/02/18 0830 12/02/18 1516  WBC 10.3 9.3  --  15.3*  --  11.3* 11.3*  --   NEUTROABS  --   --   --  12.0*  --   --  10.1*  --   HGB 12.1 12.1   < > 13.5 12.9 12.5 11.9* 15.3*  HCT 39.5 38.8   < > 44.3 38.0 41.4 39.2 45.0  MCV 90.2 90.0  --  92.1  --  92.8 92.2  --   PLT 255 238  --  267  --  233 231  --    < > = values in this interval not displayed.   Basic Metabolic Panel: Recent Labs  Lab 12/01/18 0027  12/01/18 2000 12/02/18 0142 12/02/18 0150 12/02/18 1516 12/02/18 1820 12/02/18 1938 12/03/18 0509  NA 140   < > 140 141 144 139 142  --  142  K 4.8   < > 4.8 4.7 5.5* 5.0 5.0  --  4.5  CL 101  --  100  --  103  --  103  --  103  CO2 29  --  30  --  26  --  28  --  29  GLUCOSE 110*  --  154*  --  106*  --  145*  --  147*  BUN 53*  --  57*  --  59*  --  69*  --  72*  CREATININE 1.30*  --  1.68*  --  2.05*  --  2.14*  --  1.99*  CALCIUM 8.9  --  9.0  --  9.1  --  8.5*  --  8.6*  MG  --   --   --   --  3.0*  --   --  3.1*  --   PHOS  --   --   --   --  8.0*  --   --  5.5*  --    < > = values in this interval not displayed.   GFR: Estimated Creatinine Clearance: 32.4 mL/min (A) (by C-G formula based on SCr of 1.99 mg/dL (H)). Liver Function Tests: Recent Labs  Lab 11/29/18 0040 12/01/18 2000 12/03/18 0509  AST 26 29 19   ALT 41 45* 40  ALKPHOS 67 88  71  BILITOT 0.8 0.8 0.3  PROT 6.1* 7.7 6.8  ALBUMIN 2.7* 3.1* 2.6*   No results for input(s): LIPASE, AMYLASE in the last 168 hours. No  results for input(s): AMMONIA in the last 168 hours. Coagulation Profile: No results for input(s): INR, PROTIME in the last 168 hours. Cardiac Enzymes: No results for input(s): CKTOTAL, CKMB, CKMBINDEX, TROPONINI in the last 168 hours. BNP (last 3 results) No results for input(s): PROBNP in the last 8760 hours. HbA1C: No results for input(s): HGBA1C in the last 72 hours. CBG: Recent Labs  Lab 12/02/18 1216 12/02/18 1557 12/02/18 1946 12/03/18 0037 12/03/18 0321  GLUCAP 168* 148* 144* 185* 188*   Lipid Profile: No results for input(s): CHOL, HDL, LDLCALC, TRIG, CHOLHDL, LDLDIRECT in the last 72 hours. Thyroid Function Tests: No results for input(s): TSH, T4TOTAL, FREET4, T3FREE, THYROIDAB in the last 72 hours. Anemia Panel: No results for input(s): VITAMINB12, FOLATE, FERRITIN, TIBC, IRON, RETICCTPCT in the last 72 hours. Urine analysis:    Component Value Date/Time   COLORURINE YELLOW 11/15/2018 2215   APPEARANCEUR HAZY (A) 11/15/2018 2215   LABSPEC 1.018 11/15/2018 2215   PHURINE 6.0 11/15/2018 2215   GLUCOSEU NEGATIVE 11/15/2018 2215   HGBUR NEGATIVE 11/15/2018 2215   HGBUR trace-intact 11/14/2006 0829   BILIRUBINUR NEGATIVE 11/15/2018 2215   KETONESUR NEGATIVE 11/15/2018 2215   PROTEINUR NEGATIVE 11/15/2018 2215   UROBILINOGEN 1.0 04/06/2014 1816   NITRITE NEGATIVE 11/15/2018 2215   LEUKOCYTESUR NEGATIVE 11/15/2018 2215   Sepsis Labs: (procalcitonin:4,lacticidven:4)  ) Recent Results (from the past 240 hour(s))  MRSA PCR Screening     Status: None   Collection Time: 12/02/18  3:00 AM   Specimen: Nasal Mucosa; Nasopharyngeal  Result Value Ref Range Status   MRSA by PCR NEGATIVE NEGATIVE Final    Comment:        The GeneXpert MRSA Assay (FDA approved for NASAL specimens only), is one component of a comprehensive MRSA colonization surveillance program. It is not intended to diagnose MRSA infection nor to guide or monitor treatment for MRSA  infections. Performed at [facility], [address], [place], [place] [address]          Radiology Studies: Dg Abd 1 View  Result Date: 12/01/2018 CLINICAL DATA:  Orogastric tube placement. EXAM: ABDOMEN - 1 VIEW COMPARISON:  None. FINDINGS: Tip and side port of the enteric tube below the diaphragm in the stomach. No dilated bowel loops to suggest obstruction. No evidence of free air. Cholecystectomy clips in the right upper quadrant. Bilateral pulmonary opacities as seen on radiograph earlier today. IMPRESSION: Tip and side port of the enteric tube below the diaphragm in the stomach. Electronically Signed   By: Narda Rutherford M.D.   On: 12/01/2018 23:30   Ct Head Wo Contrast  Result Date: 12/01/2018 CLINICAL DATA:  Altered level of consciousness. Found unresponsive in the bathroom. History of ruptured cerebral aneurysm. EXAM: CT HEAD WITHOUT CONTRAST TECHNIQUE: Contiguous axial images were obtained from the base of the skull through the vertex without intravenous contrast. COMPARISON:  Head CT 04/15/2015 FINDINGS: Brain: No evidence of acute hemorrhage, no subarachnoid or intraventricular blood. No subdural hemorrhage. No evidence of acute ischemia. No hydrocephalus, midline shift or mass effect. Encephalomalacia in the left temporal lobe subjacent to craniotomy defect, unchanged. 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 Vascular: Left aneurysm clipping, unchanged. No evidence of hyperdense vessel, streak artifact from aneurysm clip partially obscures evaluation of the left MCA. Skull: Left frontotemporal craniotomy.  No fracture or focal lesion. Sinuses/Orbits: No acute finding. Other: None. IMPRESSION: 1. No acute intracranial abnormality. 2. Stable postsurgical change from left-sided aneurysm clipping. Electronically Signed   By: Narda Rutherford M.D.   On: 12/01/2018 21:42   Dg Chest Port 1 View  Result Date: 12/03/2018 CLINICAL DATA:  Covert  positive.  Intubation. EXAM: PORTABLE CHEST 1 VIEW COMPARISON:  12/01/2018. FINDINGS: Endotracheal tube in stable position. NG tube noted with tip coiled stomach. Heart size normal. Persistent bilateral pulmonary infiltrates, no interim change. No prominent pleural effusion. No pneumothorax. IMPRESSION: 1. Endotracheal tube in stable position. NG tube noted with tip coiled in stomach. 2.  Persistent bilateral pulmonary infiltrates, no interim change. Electronically Signed   By: Maisie Fus  Register   On: 12/03/2018 06:57   Dg Chest Port 1 View  Result Date: 12/01/2018 CLINICAL DATA:  Intubation.  COVID-19. EXAM: PORTABLE CHEST 1 VIEW COMPARISON:  December 01, 2018 FINDINGS: The ETT terminates 15 mm above the carina. Bilateral pulmonary infiltrates are stable. No other changes. IMPRESSION: 1. The ETT terminates 1.5 cm above the carina. Recommend withdrawing 1 cm. 2. Persistent bilateral pulmonary infiltrates. Electronically Signed   By: Gerome Sam III M.D   On: 12/01/2018 20:14   Dg Chest Port 1v Same Day  Result Date: 12/01/2018 CLINICAL DATA:  Progressive hypoxia. COVID-19. Respiratory distress. EXAM: PORTABLE CHEST 1 VIEW COMPARISON:  November 30, 2018 FINDINGS: Bilateral diffuse pulmonary infiltrates are stable. The cardiomediastinal silhouette is stable. No other acute abnormalities. IMPRESSION: Stable bilateral pulmonary infiltrates.  No change. Electronically Signed   By: Gerome Sam III M.D   On: 12/01/2018 18:56        Scheduled Meds:  albuterol  2.5 mg Nebulization Q6H   chlorhexidine  15 mL Mouth/Throat BID   Chlorhexidine Gluconate Cloth  6 each Topical Daily   dexamethasone (DECADRON) injection  6 mg Intravenous Q24H   enoxaparin (LOVENOX) injection  40 mg Subcutaneous Q12H   feeding supplement (PRO-STAT SUGAR FREE 64)  30 mL Per Tube BID   feeding supplement (VITAL HIGH PROTEIN)  1,000 mL Per Tube Q24H   fentaNYL (SUBLIMAZE) injection  100 mcg Intravenous Once    fentaNYL (SUBLIMAZE) injection  50 mcg Intravenous Once   insulin aspart  0-15 Units Subcutaneous Q4H   Ipratropium-Albuterol  1 puff Inhalation QID   mouth rinse  15 mL Mouth Rinse 10 times per day   pantoprazole (PROTONIX) IV  40 mg Intravenous Q24H   Continuous Infusions:  sodium chloride 10 mL/hr at 12/03/18 0700   sodium chloride 10 mL/hr at 12/03/18 0700   fentaNYL infusion INTRAVENOUS 175 mcg/hr (12/03/18 0700)   midazolam 6 mg/hr (12/03/18 0700)   remdesivir 100 mg in NS 250 mL       LOS: 21 days   The patient is critically ill with multiple organ systems failure and requires high complexity decision making for assessment and support, frequent evaluation and titration of therapies, application of advanced monitoring technologies and extensive interpretation of multiple databases. Critical Care Time devoted to patient care services described in this note  Time spent: 40 minutes     Ahmani Prehn, Roselind Messier, MD [facility] Pager [phone]  If 7PM-7AM, please contact night-coverage [email or web address] Password [email or web address] 12/03/2018, 7:55 AM

## 2018-12-03 NOTE — Progress Notes (Signed)
Pt's daughter called back.  Updated her about current status and treatment plan.  Chesley Mires, MD [facility] Pulmonary/Critical Care 12/03/2018, 5:30 PM

## 2018-12-03 NOTE — Progress Notes (Signed)
OT Cancellation Note  Patient Details Name: MUNA DEMERS MRN: [id number] DOB: 01-02-1956   Cancelled Treatment:    Reason Eval/Treat Not Completed: Other (comment); noted and acknowledge discontinue order for OT at this time. (note pt now transferred to ICU, intubated/sedated). Acute OT to sign off; please re-consult if pt's needs change.  Lou Cal, OT [facility] Pager [phone] Office [place] 12/03/2018, 1:27 PM

## 2018-12-03 NOTE — Progress Notes (Addendum)
Pt awake despite sedation.  Was able to get her to follow basic commands.  Decreased sedation to see how well she progresses.

## 2018-12-03 NOTE — Progress Notes (Signed)
EEG complete - results pending 

## 2018-12-03 NOTE — Procedures (Signed)
ELECTROENCEPHALOGRAM REPORT   Patient: Shelley Allison       Room #: 6301 EEG No. ID: [id number] Age: 63 y.o.        Sex: female Referring Physician: Sherral Hammers Report Date:  12/03/2018        Interpreting Physician: Alexis Goodell  History: JAKARIA LAVERGNE is an 63 y.o. female with COVID pneumonia and altered mental status  Medications:  Decadron, Insulin, Remdesivir, Fentanyl, Versed  Conditions of Recording:  This is a 21 channel routine scalp EEG performed with bipolar and monopolar montages arranged in accordance to the international 10/20 system of electrode placement. One channel was dedicated to EKG recording.  The patient is in the intubated and sedated state.  Description:  The posterior background activity reaches a maximum of 8-9Hz  alpha but is actually dominated by theta rhythms.  This predominance of theta activity is seen throughout with some intermixed delta activity noted as well with faster rhythms noted rarely.   No epileptiform activity is noted.   Hyperventilation and intermittent photic stimulation were not performed.  IMPRESSION: This is an abnormal EEG secondary to mild general background slowing.  This finding may be seen with a diffuse disturbance that is etiologically nonspecific, but may include a metabolic encephalopathy or medication effect, among other possibilities.  No epileptiform activity was noted.     Alexis Goodell, MD Neurology [phone] 12/03/2018, 5:45 PM

## 2018-12-03 NOTE — Progress Notes (Signed)
Attempted to contact family at listed number - no answer.  Chesley Mires, MD [facility] Pulmonary/Critical Care 12/03/2018, 2:43 PM

## 2018-12-04 DIAGNOSIS — E870 Hyperosmolality and hypernatremia: Secondary | ICD-10-CM | POA: Diagnosis not present

## 2018-12-04 LAB — POCT I-STAT 7, (LYTES, BLD GAS, ICA,H+H)
Acid-Base Excess: 4 mmol/L — ABNORMAL HIGH (ref 0.0–2.0)
Bicarbonate: 31.6 mmol/L — ABNORMAL HIGH (ref 20.0–28.0)
Calcium, Ion: 1.26 mmol/L (ref 1.15–1.40)
HCT: 31 % — ABNORMAL LOW (ref 36.0–46.0)
Hemoglobin: 10.5 g/dL — ABNORMAL LOW (ref 12.0–15.0)
O2 Saturation: 89 %
Patient temperature: 98.2
Potassium: 4.8 mmol/L (ref 3.5–5.1)
Sodium: 145 mmol/L (ref 135–145)
TCO2: 33 mmol/L — ABNORMAL HIGH (ref 22–32)
pCO2 arterial: 59.7 mmHg — ABNORMAL HIGH (ref 32.0–48.0)
pH, Arterial: 7.331 — ABNORMAL LOW (ref 7.350–7.450)
pO2, Arterial: 62 mmHg — ABNORMAL LOW (ref 83.0–108.0)

## 2018-12-04 LAB — GLUCOSE, CAPILLARY
Glucose-Capillary: 123 mg/dL — ABNORMAL HIGH (ref 70–99)
Glucose-Capillary: 138 mg/dL — ABNORMAL HIGH (ref 70–99)
Glucose-Capillary: 139 mg/dL — ABNORMAL HIGH (ref 70–99)
Glucose-Capillary: 143 mg/dL — ABNORMAL HIGH (ref 70–99)
Glucose-Capillary: 168 mg/dL — ABNORMAL HIGH (ref 70–99)
Glucose-Capillary: 215 mg/dL — ABNORMAL HIGH (ref 70–99)

## 2018-12-04 LAB — CBC WITH DIFFERENTIAL/PLATELET
Abs Immature Granulocytes: 0.02 10*3/uL (ref 0.00–0.07)
Basophils Absolute: 0 10*3/uL (ref 0.0–0.1)
Basophils Relative: 0 %
Eosinophils Absolute: 0.1 10*3/uL (ref 0.0–0.5)
Eosinophils Relative: 1 %
HCT: 34.8 % — ABNORMAL LOW (ref 36.0–46.0)
Hemoglobin: 10.3 g/dL — ABNORMAL LOW (ref 12.0–15.0)
Immature Granulocytes: 0 %
Lymphocytes Relative: 8 %
Lymphs Abs: 0.6 10*3/uL — ABNORMAL LOW (ref 0.7–4.0)
MCH: 28 pg (ref 26.0–34.0)
MCHC: 29.6 g/dL — ABNORMAL LOW (ref 30.0–36.0)
MCV: 94.6 fL (ref 80.0–100.0)
Monocytes Absolute: 0.7 10*3/uL (ref 0.1–1.0)
Monocytes Relative: 9 %
Neutro Abs: 5.8 10*3/uL (ref 1.7–7.7)
Neutrophils Relative %: 82 %
Platelets: 198 10*3/uL (ref 150–400)
RBC: 3.68 MIL/uL — ABNORMAL LOW (ref 3.87–5.11)
RDW: 15.2 % (ref 11.5–15.5)
WBC: 7.1 10*3/uL (ref 4.0–10.5)
nRBC: 0 % (ref 0.0–0.2)

## 2018-12-04 LAB — D-DIMER, QUANTITATIVE: D-Dimer, Quant: 1.87 ug/mL-FEU — ABNORMAL HIGH (ref 0.00–0.50)

## 2018-12-04 LAB — COMPREHENSIVE METABOLIC PANEL
ALT: 36 U/L (ref 0–44)
AST: 22 U/L (ref 15–41)
Albumin: 2.3 g/dL — ABNORMAL LOW (ref 3.5–5.0)
Alkaline Phosphatase: 64 U/L (ref 38–126)
Anion gap: 8 (ref 5–15)
BUN: 88 mg/dL — ABNORMAL HIGH (ref 8–23)
CO2: 31 mmol/L (ref 22–32)
Calcium: 8.6 mg/dL — ABNORMAL LOW (ref 8.9–10.3)
Chloride: 109 mmol/L (ref 98–111)
Creatinine, Ser: 1.88 mg/dL — ABNORMAL HIGH (ref 0.44–1.00)
GFR calc Af Amer: 32 mL/min — ABNORMAL LOW (ref >60)
GFR calc non Af Amer: 28 mL/min — ABNORMAL LOW (ref >60)
Glucose, Bld: 142 mg/dL — ABNORMAL HIGH (ref 70–99)
Potassium: 4.8 mmol/L (ref 3.5–5.1)
Sodium: 148 mmol/L — ABNORMAL HIGH (ref 135–145)
Total Bilirubin: 0.4 mg/dL (ref 0.3–1.2)
Total Protein: 6.5 g/dL (ref 6.5–8.1)

## 2018-12-04 LAB — PHOSPHORUS: Phosphorus: 3.8 mg/dL (ref 2.5–4.6)

## 2018-12-04 LAB — MAGNESIUM: Magnesium: 3.4 mg/dL — ABNORMAL HIGH (ref 1.7–2.4)

## 2018-12-04 LAB — C-REACTIVE PROTEIN: CRP: 11.5 mg/dL — ABNORMAL HIGH (ref <1.0)

## 2018-12-04 LAB — PROCALCITONIN: Procalcitonin: 0.15 ng/mL

## 2018-12-04 MED ORDER — SODIUM CHLORIDE 0.9 % IV BOLUS
1000.0000 mL | Freq: Once | INTRAVENOUS | Status: AC
  Administered 2018-12-04: 1000 mL via INTRAVENOUS

## 2018-12-04 MED ORDER — FREE WATER
100.0000 mL | Freq: Three times a day (TID) | Status: DC
  Administered 2018-12-04 – 2018-12-06 (×6): 100 mL

## 2018-12-04 MED ORDER — CISATRACURIUM BESYLATE (PF) 10 MG/5ML IV SOLN
0.1000 mg/kg | Freq: Once | INTRAVENOUS | Status: AC
  Administered 2018-12-04: 17:00:00 9.2 mg via INTRAVENOUS
  Filled 2018-12-04: qty 4.6

## 2018-12-04 MED ORDER — POLYETHYLENE GLYCOL 3350 17 G PO PACK
17.0000 g | PACK | Freq: Every day | ORAL | Status: DC
  Administered 2018-12-04 – 2018-12-05 (×2): 17 g via ORAL
  Filled 2018-12-04 (×2): qty 1

## 2018-12-04 MED ORDER — DEXMEDETOMIDINE HCL IN NACL 400 MCG/100ML IV SOLN
0.0000 ug/kg/h | INTRAVENOUS | Status: DC
  Administered 2018-12-04 – 2018-12-05 (×2): 0.4 ug/kg/h via INTRAVENOUS
  Filled 2018-12-04 (×2): qty 100

## 2018-12-04 MED ORDER — FUROSEMIDE 10 MG/ML IJ SOLN
40.0000 mg | Freq: Once | INTRAMUSCULAR | Status: AC
  Administered 2018-12-04: 40 mg via INTRAVENOUS
  Filled 2018-12-04: qty 4

## 2018-12-04 MED ORDER — DOCUSATE SODIUM 50 MG/5ML PO LIQD
100.0000 mg | Freq: Two times a day (BID) | ORAL | Status: DC | PRN
  Administered 2018-12-07: 100 mg
  Filled 2018-12-04: qty 10

## 2018-12-04 MED ORDER — NOREPINEPHRINE 4 MG/250ML-% IV SOLN
INTRAVENOUS | Status: AC
  Filled 2018-12-04: qty 250

## 2018-12-04 MED ORDER — BISACODYL 10 MG RE SUPP
10.0000 mg | Freq: Every day | RECTAL | Status: DC | PRN
  Administered 2018-12-04: 10 mg via RECTAL
  Filled 2018-12-04 (×2): qty 1

## 2018-12-04 MED ORDER — NOREPINEPHRINE 4 MG/250ML-% IV SOLN
0.0000 ug/min | INTRAVENOUS | Status: DC
  Administered 2018-12-04: 2 ug/min via INTRAVENOUS
  Administered 2018-12-05: 7 ug/min via INTRAVENOUS
  Administered 2018-12-05: 6 ug/min via INTRAVENOUS
  Administered 2018-12-06: 19 ug/min via INTRAVENOUS
  Administered 2018-12-06: 25 ug/min via INTRAVENOUS
  Administered 2018-12-06: 15 ug/min via INTRAVENOUS
  Administered 2018-12-06: 10 ug/min via INTRAVENOUS
  Administered 2018-12-06: 17:00:00 15 ug/min via INTRAVENOUS
  Administered 2018-12-07: 3 ug/min via INTRAVENOUS
  Administered 2018-12-07: 20 ug/min via INTRAVENOUS
  Administered 2018-12-07: 04:00:00 13 ug/min via INTRAVENOUS
  Administered 2018-12-08: 1 ug/min via INTRAVENOUS
  Filled 2018-12-04 (×13): qty 250

## 2018-12-04 MED ORDER — PROPOFOL 1000 MG/100ML IV EMUL
0.0000 ug/kg/min | INTRAVENOUS | Status: DC
  Administered 2018-12-04: 50 ug/kg/min via INTRAVENOUS
  Administered 2018-12-04: 5 ug/kg/min via INTRAVENOUS
  Administered 2018-12-05 (×3): 50 ug/kg/min via INTRAVENOUS
  Administered 2018-12-05: 30 ug/kg/min via INTRAVENOUS
  Administered 2018-12-05: 40 ug/kg/min via INTRAVENOUS
  Administered 2018-12-05: 50 ug/kg/min via INTRAVENOUS
  Administered 2018-12-06 (×2): 30 ug/kg/min via INTRAVENOUS
  Administered 2018-12-06: 50 ug/kg/min via INTRAVENOUS
  Administered 2018-12-06 (×3): 40 ug/kg/min via INTRAVENOUS
  Administered 2018-12-07: 30 ug/kg/min via INTRAVENOUS
  Administered 2018-12-07: 20 ug/kg/min via INTRAVENOUS
  Administered 2018-12-07: 10 ug/kg/min via INTRAVENOUS
  Administered 2018-12-07: 30 ug/kg/min via INTRAVENOUS
  Administered 2018-12-09: 9.996 ug/kg/min via INTRAVENOUS
  Administered 2018-12-10 (×2): 20 ug/kg/min via INTRAVENOUS
  Administered 2018-12-11: 10 ug/kg/min via INTRAVENOUS
  Filled 2018-12-04 (×23): qty 100

## 2018-12-04 MED ORDER — FENTANYL BOLUS VIA INFUSION
50.0000 ug | INTRAVENOUS | Status: DC | PRN
  Administered 2018-12-06 – 2018-12-13 (×5): 50 ug via INTRAVENOUS
  Filled 2018-12-04: qty 50

## 2018-12-04 MED ORDER — FENTANYL 2500MCG IN NS 250ML (10MCG/ML) PREMIX INFUSION
50.0000 ug/h | INTRAVENOUS | Status: DC
  Administered 2018-12-04 – 2018-12-07 (×6): 200 ug/h via INTRAVENOUS
  Administered 2018-12-07: 25 ug/h via INTRAVENOUS
  Administered 2018-12-10 – 2018-12-11 (×2): 50 ug/h via INTRAVENOUS
  Filled 2018-12-04 (×9): qty 250

## 2018-12-04 MED ORDER — MIDAZOLAM HCL 2 MG/2ML IJ SOLN
2.0000 mg | INTRAMUSCULAR | Status: DC | PRN
  Administered 2018-12-05 – 2018-12-11 (×6): 2 mg via INTRAVENOUS
  Filled 2018-12-04 (×6): qty 2

## 2018-12-04 NOTE — Progress Notes (Signed)
NAME:  Shelley Allison, MRN:  [id number], DOB:  31-Aug-1955, LOS: 22 ADMISSION DATE:  10/19/2018, CONSULTATION DATE:  10/16 REFERRING MD:  Elvera Lennox, CHIEF COMPLAINT:  Dyspnea   Brief History   63 yo female presented to ER 10/24/2018 with weakness, n/v/d x one week after attending a wedding.  Found to have hypoxia, fever, productive cough from COVID pneumonia.   Past Medical History  Hypertension Cerebral artery aneurysm rupture  Significant Hospital Events   09/28 admit, start decadron/remdesivir 09/29 given tocilizumab in setting of worsening hypoxia; pt deferred option for convalescent plasma 09/30 transfer to ICU, PCCM consulted 10/03 transfer back to PCU 10/16 PCCM reconsulted for presistent hypoxia; restart solumedrol  10/17 transferred back to ICU with AMS and hypoxia, required intubation 10/18 restart remdesivir  Consults:    Procedures:  ETT 10/17 >> Rt radial a line 10/18 >>   Significant Diagnostic Tests:  Doppler b/l legs 10/01 >> no DVT CT angio chest 10/03 >> borderline LAN up to 1.5 cm, b/l patchy consolidation and GGO Echo 10/16 >> EF 70 to 75%, PAS 50 mmHg CT head 10/17 >> s/p Lt frontotemporal craniotomy, encephalomalacia Lt temporal lobe, chronic small posterior temporal cortical infarct, Lt aneurysm clipping EEG 10/19 >> mild general background slowing  Micro Data:  SARS COV2 9/28 >> POSITIVE Sputum 10/18 >> Urine 10/18 >> negative  AntimicrobialsCOVID RX:  Remdesivir 9/28 >> 10/02 Solumedrol 9/28 >> 10/07 Tocilizumab 9/29  Solumedrol 10/16 >> 10/18 Remdesivir 10/18 >>  Decadron 10/18 >>   Interim history/subjective:  PaO2:FiO2 124.  FiO2 50%, PEEP 10.    Objective   Blood pressure 129/72, pulse 99, temperature 97.8 F (36.6 C), temperature source Oral, resp. rate 20, height 5\' 5"  (1.651 m), weight 91.7 kg, SpO2 90 %.    Vent Mode: PRVC FiO2 (%):  [50 %-60 %] 50 % Set Rate:  [35 bmp] 35 bmp Vt Set:  [400 mL] 400 mL PEEP:  [10 cmH20] 10 cmH20  Plateau Pressure:  [21 cmH20-38 cmH20] 26 cmH20   Intake/Output Summary (Last 24 hours) at 12/04/2018 1044 Last data filed at 12/04/2018 1000 Gross per 24 hour  Intake 2327.53 ml  Output 260 ml  Net 2067.53 ml   Filed Weights   12/02/18 0500 12/03/18 0500 12/04/18 0500  Weight: 86.3 kg 92.1 kg 91.7 kg    Examination:  General - more alert Eyes - pupils reactive ENT - ETT in place Cardiac - regular rate/rhythm, no murmur Chest - scattered rhonchi Abdomen - soft, non tender, + bowel sounds Extremities - no cyanosis, clubbing, or edema Skin - no rashes Neuro - RASS -1, follows commands, moves extremities    Resolved Hospital Problem list     Assessment & Plan:   Acute hypoxic/hypercapnic respiratory failure in setting of COVID 19 PNA and altered mental status 10/17. - ?if she could have aspirated on 10/17; if she develops fever and increasing WBC, then consider adding ABx - restarted steroids 10/16 for ?steroid responsive pulmonary infiltrates >> continue for now - goal SpO2 88 to 95% - restarted remdesivir 10/18 for additional 5 day course - prn BDs - might be able to start pressure support trials   Acute metabolic encephalopathy. - likely related to hypoxia - CT head 10/17 unrevealing for acute findings and EEG 10/19 negative - RASS goal 0 to -1  CKD 3. - f/u BMET  Hx of HTN. - negative fluid balance as able  Steroid induced hyperglycemia. - SSI   Best practice:  Diet: tube  feeds DVT prophylaxis: lovenox GI prophylaxis: protonix Mobility: bed rest Code Status: full code Disposition: ICU  Labs    CMP Latest Ref Rng & Units 12/04/2018 12/04/2018 12/03/2018  Glucose 70 - 99 mg/dL 142(H) - -  BUN 8 - 23 mg/dL 88(H) - -  Creatinine 0.44 - 1.00 mg/dL 1.88(H) - -  Sodium 135 - 145 mmol/L 148(H) 145 144  Potassium 3.5 - 5.1 mmol/L 4.8 4.8 4.2  Chloride 98 - 111 mmol/L 109 - -  CO2 22 - 32 mmol/L 31 - -  Calcium 8.9 - 10.3 mg/dL 8.6(L) - -  Total  Protein 6.5 - 8.1 g/dL 6.5 - -  Total Bilirubin 0.3 - 1.2 mg/dL 0.4 - -  Alkaline Phos 38 - 126 U/L 64 - -  AST 15 - 41 U/L 22 - -  ALT 0 - 44 U/L 36 - -   CBC Latest Ref Rng & Units 12/04/2018 12/04/2018 12/03/2018  WBC 4.0 - 10.5 K/uL 7.1 - -  Hemoglobin 12.0 - 15.0 g/dL 10.3(L) 10.5(L) 11.6(L)  Hematocrit 36.0 - 46.0 % 34.8(L) 31.0(L) 34.0(L)  Platelets 150 - 400 K/uL 198 - -   ABG    Component Value Date/Time   PHART 7.331 (L) 12/04/2018 0427   PCO2ART 59.7 (H) 12/04/2018 0427   PO2ART 62.0 (L) 12/04/2018 0427   HCO3 31.6 (H) 12/04/2018 0427   TCO2 33 (H) 12/04/2018 0427   O2SAT 89.0 12/04/2018 0427   CBG (last 3)  Recent Labs    12/03/18 2345 12/04/18 0351 12/04/18 0728  GLUCAP 160* 139* 138*    D/w Dr. Sherral Hammers  CC time 79 minutes  Chesley Mires, MD [place] 12/04/2018, 10:44 AM

## 2018-12-04 NOTE — Progress Notes (Signed)
Spoke with pt's daughter over the phone.  Updated about current status and treatment plan.  Chesley Mires, MD [facility] Pulmonary/Critical Care 12/04/2018, 1:36 PM

## 2018-12-04 NOTE — Progress Notes (Signed)
Pt remains extremely dyssynchronous.  Notified attending.

## 2018-12-04 NOTE — Progress Notes (Signed)
Increased WOB.  Increased b/l crackles on exam.  Low grade temp 38F.  SpO2 in low 90's on PEEP 10, FiO2 60%.  No secretions with suctioning.    Sedation changed to diprivan and fentanyl gtt.  Will give additional dose of lasix.  Will give nimbex x one dose.  Chesley Mires, MD [facility] Pulmonary/Critical Care 12/04/2018, 4:46 PM

## 2018-12-04 NOTE — Progress Notes (Signed)
PROGRESS NOTE    Shelley Allison  RSW:[id number] DOB: 1956/01/21 DOA: 12-02-18 PCP: Leeroy Bock, DO   Brief Narrative:  Shelley Allison is a 63 y.o. BF PMHx  HTN and cerebral artery aneurysm rupture   Presented to the ED 9/28 with about 1 week of N/V/D followed by progressive shortness of breath with associated fever starting 9/27. Symptoms started around the time of a large wedding she attended. She was found to be hypoxic requiring 6L O2 with positive SARS-CoV-2 testing, elevated inflammatory markers (CRP 20), and reticular CXR infiltrates, possibly left base infiltrate. She was admitted to [facility], started on steroids, and remdesivir 9/28, given tocilizumab on 9/29 when hypoxia worsened. D-dimer has risen from 2.93 to 6.72, so CTA chest is ordered. Due to progressive hypoxia and tachypnea, the patient will be transferred to the ICU 9/30.   Subjective: 10/20 intubated/sedated, but more awake today.  Follows commands.   Assessment & Plan:   Principal Problem:   Respiratory tract infection due to COVID-19 virus Active Problems:   OBESITY, NOS   HYPERTENSION, BENIGN SYSTEMIC   CEREBRAL ANEURYSM, RUPTURED   Pneumonia due to COVID-19 virus   Acute respiratory failure with hypoxia (HCC)   Acute pulmonary embolus (HCC)   Essential hypertension   AKI (acute kidney injury) (HCC)   Morbid obesity with BMI of 70 and over, adult (HCC)   CAP (community acquired pneumonia)   Acute respiratory disease due to COVID-19 virus   Hypernatremia  Acute metabolic encephalopathy -When patient admitted to ICU RR 45, lethargic unresponsive resulted in intubation -Multifactorial hypoxia vs Covid encephalopathy vs infection vs seizure -10/17 CT head negative acute infarct see results below -Procalcitonin.  Therefore of infection less likely cause -Unsure of how long patient was down without oxygen, hypoxic brain injury? -Since most likely not secondary to a bacterial superinfection and head CT  negative for acute CVA, will obtain EEG. -EEG; negative seizure activity, abnormal DDx includes multiple causes see results below. -10/20 continue to titrate sedation down as tolerated.  Acute respiratory failure with hypoxia/relapse Covid pneumonia? -10/18 AM PF ratio= 107 -Prone patient 16 hours/day, with 8-hour break -Combivent QID -Covid inflammatory markers -Decadron 6 mg daily x10 days (repeat treatment) -Remdesivir per pharmacy protocol (repeat treatment) -10/2 previously discussed use of Covid convalescent plasma with patient who declined this treatment COVID 19 pneumonia COVID-19 Labs  Recent Labs    12/02/18 0150 12/03/18 0509 12/04/18 0500  DDIMER  --  2.24* 1.87*  CRP 5.0* 9.8* 11.5*   Lab Results  Component Value Date   SARSCOV2NAA POSITIVE (A) 12-02-18  -Strict in and out +5.4 L -Daily weight Filed Weights   12/02/18 0500 12/03/18 0500 12/04/18 0500  Weight: 86.3 kg 92.1 kg 91.7 kg  -10/19 ABG pending  -Procalcitonin 10/17= 0.23, 10/19= 0.42.  Not consistent with bacterial infection would not start antibiotics decreased respiratory status most likely not secondary to aspiration -10/19 PF ratio= 126.  Will hold off on proning today since some improvement. -Continue daily weaning trials -10/19 attempted to decrease sedation patient did not tolerate became agitated/vent dyssynchrony -10/20 wean sedation as tolerated  Acute pulmonary embolus/DVT -10/3 CTA chest negative for PE -10/1 bilateral lower extremity Doppler negative for DVT  CAP -Completed 7-day course antibiotics  Demand ischemia -Troponin HS;.0.220/0.Marland Kitchen206   Essential HTN - Amlodipine 10 mg daily (hold) - 10/1 increase Hydralazine 50 mg QID (hold) - Hydralazine IV PRN -10/2 lisinopril 2.5 mg daily (hold) - 10/2 increase Metoprolol 50 mg  BID (  hold) -Metoprolol PRN - BP goal<120/80 secondary to Hx ruptured aneurysm.    Acute kidney injury Recent Labs  Lab 12/01/18 2000 12/02/18 0150  12/02/18 1820 12/03/18 0509 12/04/18 0500  CREATININE 1.68* 2.05* 2.14* 1.99* 1.88*  -10/18 have been worsening over the past few days we will consult nephrology in the a.m. -Urine electrolytes, osmolality, serum osmolality, BMP; calculate FeNa -Lasix IV PRN -10/20 Lasix IV 40 mg  Morbid obesity -BMI 36  -Risk factor for poor outcome to COVID 19 pneumonia   Hx Ruptured Cerebral Artery aneurysm -Obtain good blood pressure control: Goal BP< 120/80 -See essential HTN  Hyperkalemia -Resolved  Hyperglycemia -Moderate SSI  Hypernatremia -Free water 100ml TID   DVT prophylaxis: Lovenox treatment dose Code Status: Full Family Communication: 10/19 Dr. Coralyn HellingVineet Sood spoke with family  Disposition Plan: TBD   Consultants:  PCCM    Procedures/Significant Events:  9/28 PCXR; 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 There is atelectatic change in the left base. Early pneumonia in the left base cannot be excluded. -Heart upper normal in size.   10/1 bilateral lower extremity Doppler;-right: Negative DVT/cystic structure found in the popliteal fossa. -Left: Negative DVT/cystic structure found in the popliteal fossa 10/2 PCXR;Diffuse interstitial and alveolar opacities, most confluent in the lung bases, increased since prior study. 10/3 CTA chest PE protocol; negative for PE, positive multifocal pneumonia 10/16 echocardiogram;Left Ventricle: EF= 70 to 75%. The left ventricle has hyperdynamic function.  10/17 PCXR; persistent bilateral pulmonary infiltrates stable 10/17 CT head  Wo contrast: Negative acute intracranial abnormality 10/19 EEG;-abnormal EEG secondary to mild general background slowing.  This finding may be seen with a diffuse disturbance that is etiologically nonspecific, but may include a metabolic encephalopathy or medication effect,  -Negative seizure activity     I have personally reviewed and interpreted all radiology studies and  my findings are as above.  VENTILATOR SETTINGS: Vent mode; PRVC Rate set; 35 Vt Set; 400 FiO2; 50% PEEP; 10  10/20 PF ratio= 124    Cultures 9/28 SARS coronavirus positive      Antimicrobials: Anti-infectives (From admission, onward)   Start     Stop   12/03/18 1600  remdesivir 100 mg in sodium chloride 0.9 % 250 mL IVPB     12/07/18 1559   12/02/18 2030  remdesivir 100 mg in sodium chloride 0.9 % 250 mL IVPB     12/03/18 0817   11/13/18 1600  remdesivir 100 mg in sodium chloride 0.9 % 250 mL IVPB     11/16/18 1845   11/13/18 1200  azithromycin (ZITHROMAX) 500 mg in sodium chloride 0.9 % 250 mL IVPB     11/13/18 1340   11/13/18 1200  cefTRIAXone (ROCEPHIN) 1 g in sodium chloride 0.9 % 100 mL IVPB  Status:  Discontinued     11/16/18 1334   11/11/2018 2200  remdesivir 200 mg in sodium chloride 0.9 % 250 mL IVPB     10/29/2018 2213   10/19/2018 1415  cefTRIAXone (ROCEPHIN) 1 g in sodium chloride 0.9 % 100 mL IVPB     10/17/2018 1517   10/21/2018 1415  azithromycin (ZITHROMAX) 500 mg in sodium chloride 0.9 % 250 mL IVPB     11/10/2018 1736       Devices   LINES / TUBES:      Continuous Infusions:  sodium chloride Stopped (12/03/18 1459)   feeding supplement (VITAL 1.5 CAL) 1,000 mL (12/03/18 1746)   fentaNYL infusion INTRAVENOUS 125 mcg/hr (12/04/18 1000)  midazolam Stopped (12/04/18 1191)   remdesivir 100 mg in NS 250 mL Stopped (12/03/18 1620)     Objective: Vitals:   12/04/18 0800 12/04/18 0811 12/04/18 0900 12/04/18 1000  BP: 140/89 (!) 173/85 130/90 129/72  Pulse: 92 86 97 99  Resp: (!) 30 (!) 39 (!) 30 20  Temp: 97.8 F (36.6 C)     TempSrc: Oral     SpO2: 95% 95% (!) 89% 90%  Weight:      Height:        Intake/Output Summary (Last 24 hours) at 12/04/2018 1033 Last data filed at 12/04/2018 1000 Gross per 24 hour  Intake 2327.53 ml  Output 260 ml  Net 2067.53 ml   Filed Weights   12/02/18 0500 12/03/18 0500 12/04/18 0500  Weight: 86.3 kg  92.1 kg 91.7 kg   Physical Exam:  General: Sedated, follows commands, positive acute respiratory distress Eyes: negative scleral hemorrhage, negative anisocoria, negative icterus ENT: Negative Runny nose, negative gingival bleeding, Neck:  Negative scars, masses, torticollis, lymphadenopathy, JVD Lungs: Tachypneic clear to auscultation bilaterally without wheezes or crackles Cardiovascular: Tachycardic without murmur gallop or rub normal S1 and S2 Abdomen: negative abdominal pain, nondistended, positive soft, bowel sounds, no rebound, no ascites, no appreciable mass Extremities: No significant cyanosis, clubbing, or edema bilateral lower extremities Skin: Negative rashes, lesions, ulcers Psychiatric: Sedated/intubated Central nervous system: Follows commands      Data Reviewed: Care during the described time interval was provided by me .  I have reviewed this patient's available data, including medical history, events of note, physical examination, and all test results as part of my evaluation.   CBC: Recent Labs  Lab 12/01/18 2000  12/02/18 0628 12/02/18 0830 12/02/18 1516 12/03/18 0509 12/03/18 0805 12/04/18 0427 12/04/18 0500  WBC 15.3*  --  11.3* 11.3*  --  10.7*  --   --  7.1  NEUTROABS 12.0*  --   --  10.1*  --  9.3*  --   --  5.8  HGB 13.5   < > 12.5 11.9* 15.3* 11.3* 11.6* 10.5* 10.3*  HCT 44.3   < > 41.4 39.2 45.0 37.7 34.0* 31.0* 34.8*  MCV 92.1  --  92.8 92.2  --  93.3  --   --  94.6  PLT 267  --  233 231  --  229  --   --  198   < > = values in this interval not displayed.   Basic Metabolic Panel: Recent Labs  Lab 12/01/18 2000  12/02/18 0150  12/02/18 1820 12/02/18 1938 12/03/18 0509 12/03/18 0805 12/03/18 1800 12/04/18 0427 12/04/18 0500  NA 140   < > 144   < > 142  --  142 144  --  145 148*  K 4.8   < > 5.5*   < > 5.0  --  4.5 4.2  --  4.8 4.8  CL 100  --  103  --  103  --  103  --   --   --  109  CO2 30  --  26  --  28  --  29  --   --   --  31    GLUCOSE 154*  --  106*  --  145*  --  147*  --   --   --  142*  BUN 57*  --  59*  --  69*  --  72*  --   --   --  88*  CREATININE  1.68*  --  2.05*  --  2.14*  --  1.99*  --   --   --  1.88*  CALCIUM 9.0  --  9.1  --  8.5*  --  8.6*  --   --   --  8.6*  MG  --   --  3.0*  --   --  3.1* 3.2*  --  2.9*  --  3.4*  PHOS  --   --  8.0*  --   --  5.5* 4.6  --  4.1  --  3.8   < > = values in this interval not displayed.   GFR: Estimated Creatinine Clearance: 34.3 mL/min (A) (by C-G formula based on SCr of 1.88 mg/dL (H)). Liver Function Tests: Recent Labs  Lab 11/29/18 0040 12/01/18 2000 12/03/18 0509 12/04/18 0500  AST 26 29 19 22   ALT 41 45* 40 36  ALKPHOS 67 88 71 64  BILITOT 0.8 0.8 0.3 0.4  PROT 6.1* 7.7 6.8 6.5  ALBUMIN 2.7* 3.1* 2.6* 2.3*   No results for input(s): LIPASE, AMYLASE in the last 168 hours. No results for input(s): AMMONIA in the last 168 hours. Coagulation Profile: No results for input(s): INR, PROTIME in the last 168 hours. Cardiac Enzymes: No results for input(s): CKTOTAL, CKMB, CKMBINDEX, TROPONINI in the last 168 hours. BNP (last 3 results) No results for input(s): PROBNP in the last 8760 hours. HbA1C: Recent Labs    12/02/18 0838  HGBA1C 6.4*   CBG: Recent Labs  Lab 12/03/18 1527 12/03/18 1931 12/03/18 2345 12/04/18 0351 12/04/18 0728  GLUCAP 118* 136* 160* 139* 138*   Lipid Profile: No results for input(s): CHOL, HDL, LDLCALC, TRIG, CHOLHDL, LDLDIRECT in the last 72 hours. Thyroid Function Tests: No results for input(s): TSH, T4TOTAL, FREET4, T3FREE, THYROIDAB in the last 72 hours. Anemia Panel: No results for input(s): VITAMINB12, FOLATE, FERRITIN, TIBC, IRON, RETICCTPCT in the last 72 hours. Urine analysis:    Component Value Date/Time   COLORURINE YELLOW 11/15/2018 2215   APPEARANCEUR HAZY (A) 11/15/2018 2215   LABSPEC 1.018 11/15/2018 2215   PHURINE 6.0 11/15/2018 2215   GLUCOSEU NEGATIVE 11/15/2018 2215   HGBUR NEGATIVE  11/15/2018 2215   HGBUR trace-intact 11/14/2006 0829   BILIRUBINUR NEGATIVE 11/15/2018 2215   KETONESUR NEGATIVE 11/15/2018 2215   PROTEINUR NEGATIVE 11/15/2018 2215   UROBILINOGEN 1.0 04/06/2014 1816   NITRITE NEGATIVE 11/15/2018 2215   LEUKOCYTESUR NEGATIVE 11/15/2018 2215   Sepsis Labs: @LABRCNTIP (procalcitonin:4,lacticidven:4)  ) Recent Results (from the past 240 hour(s))  MRSA PCR Screening     Status: None   Collection Time: 12/02/18  3:00 AM   Specimen: Nasal Mucosa; Nasopharyngeal  Result Value Ref Range Status   MRSA by PCR NEGATIVE NEGATIVE Final    Comment:        The GeneXpert MRSA Assay (FDA approved for NASAL specimens only), is one component of a comprehensive MRSA colonization surveillance program. It is not intended to diagnose MRSA infection nor to guide or monitor treatment for MRSA infections. Performed at [facility], [place] [address] [place], [place] [address]   Culture, Urine     Status: None   Collection Time: 12/02/18  2:17 PM   Specimen: Urine, Catheterized  Result Value Ref Range Status   Specimen Description   Final    URINE, CATHETERIZED Performed at [place] [address], [place],  [address]    Special Requests   Final    NONE Performed  at [facility], [address] [place], [place] [address]    Culture   Final    NO GROWTH Performed at [facility], [address], [place], [place] [address]    Report Status 12/03/2018 FINAL  Final         Radiology Studies: Dg Chest Port 1 View  Result Date: 12/03/2018 CLINICAL DATA:  Covert positive.  Intubation. EXAM: PORTABLE CHEST 1 VIEW COMPARISON:  12/01/2018. FINDINGS: Endotracheal tube in stable position. NG tube noted with tip coiled stomach. Heart size normal. Persistent bilateral pulmonary infiltrates, no interim change. No prominent pleural effusion. No pneumothorax. IMPRESSION: 1. Endotracheal tube in  stable position. NG tube noted with tip coiled in stomach. 2.  Persistent bilateral pulmonary infiltrates, no interim change. Electronically Signed   By: Maisie Fus  Register   On: 12/03/2018 06:57        Scheduled Meds:  chlorhexidine  15 mL Mouth/Throat BID   Chlorhexidine Gluconate Cloth  6 each Topical Daily   dexamethasone (DECADRON) injection  6 mg Intravenous Q24H   enoxaparin (LOVENOX) injection  40 mg Subcutaneous Q12H   feeding supplement (PRO-STAT SUGAR FREE 64)  60 mL Per Tube TID   free water  100 mL Per Tube Q8H   furosemide  40 mg Intravenous Once   insulin aspart  0-15 Units Subcutaneous Q4H   mouth rinse  15 mL Mouth Rinse 10 times per day   pantoprazole sodium  40 mg Per Tube Daily   polyethylene glycol  17 g Oral Daily   Continuous Infusions:  sodium chloride Stopped (12/03/18 1459)   feeding supplement (VITAL 1.5 CAL) 1,000 mL (12/03/18 1746)   fentaNYL infusion INTRAVENOUS 125 mcg/hr (12/04/18 1000)   midazolam Stopped (12/04/18 0722)   remdesivir 100 mg in NS 250 mL Stopped (12/03/18 1620)     LOS: 22 days   The patient is critically ill with multiple organ systems failure and requires high complexity decision making for assessment and support, frequent evaluation and titration of therapies, application of advanced monitoring technologies and extensive interpretation of multiple databases. Critical Care Time devoted to patient care services described in this note  Time spent: 40 minutes     Ermina Oberman, Roselind Messier, MD [facility] Pager [phone]  If 7PM-7AM, please contact night-coverage [email or web address] Password [email or web address] 12/04/2018, 10:33 AM

## 2018-12-04 NOTE — Progress Notes (Signed)
Updated daughter on POC and offered opportunity to ask any questions or address concerns

## 2018-12-04 NOTE — Plan of Care (Signed)
Updated the pt's daughter on the phone and arranged a video conference with Elink. Vital signs stable. Bed is on rotation.

## 2018-12-04 NOTE — Progress Notes (Signed)
[facility] Progress Note Patient Name: Shelley Allison DOB: 1955-09-04 MRN: [id number]   Date of Service  12/04/2018  HPI/Events of Note  Oliguria - No CVL or CVP. LVEF = 70-75%.  eICU Interventions  Will bolus with 0.9 NaCl 1 liter IV over 1 hour now.      Intervention Category Intermediate Interventions: Oliguria - evaluation and management  Sommer,Steven Eugene 12/04/2018, 5:56 AM

## 2018-12-04 NOTE — Progress Notes (Signed)
Daughter and mother called to get update on POC - Provided updated and answered questions.  Allowed them to speak with phone next to patient's ear.

## 2018-12-04 NOTE — Progress Notes (Addendum)
Vent alarming multiple times - Initially thought it was the patient needing more sedation.  Attempted suctioning and increasing sedation.  Eventually was unable to advance the suction catheter.  Contacted RT for assistance and returned to previous sedation.    Pt became dyssynchronous again despite RT assistance.  Resuming sedation and increasing.

## 2018-12-04 NOTE — Progress Notes (Signed)
Made MD aware of increased sedation needs

## 2018-12-04 NOTE — Progress Notes (Signed)
Decreasing sedation d/t hemodynamics

## 2018-12-04 NOTE — Progress Notes (Signed)
No UOP since beginning of shift.  Made MD aware

## 2018-12-04 NOTE — Progress Notes (Signed)
Reported to have agitation and vent dyssynchrony.  Will change sedation to precedex, fentanyl gtt with prn versed.  Chesley Mires, MD [facility] Pulmonary/Critical Care 12/04/2018, 2:46 PM

## 2018-12-05 ENCOUNTER — Inpatient Hospital Stay (HOSPITAL_COMMUNITY): Payer: BC Managed Care – PPO

## 2018-12-05 DIAGNOSIS — J988 Other specified respiratory disorders: Secondary | ICD-10-CM | POA: Diagnosis not present

## 2018-12-05 DIAGNOSIS — N179 Acute kidney failure, unspecified: Secondary | ICD-10-CM

## 2018-12-05 DIAGNOSIS — U071 COVID-19: Secondary | ICD-10-CM | POA: Diagnosis not present

## 2018-12-05 DIAGNOSIS — I2690 Septic pulmonary embolism without acute cor pulmonale: Secondary | ICD-10-CM

## 2018-12-05 LAB — CBC WITH DIFFERENTIAL/PLATELET
Abs Immature Granulocytes: 0.03 10*3/uL (ref 0.00–0.07)
Basophils Absolute: 0 10*3/uL (ref 0.0–0.1)
Basophils Relative: 0 %
Eosinophils Absolute: 0 10*3/uL (ref 0.0–0.5)
Eosinophils Relative: 0 %
HCT: 35.9 % — ABNORMAL LOW (ref 36.0–46.0)
Hemoglobin: 10.2 g/dL — ABNORMAL LOW (ref 12.0–15.0)
Immature Granulocytes: 0 %
Lymphocytes Relative: 11 %
Lymphs Abs: 0.9 10*3/uL (ref 0.7–4.0)
MCH: 27.5 pg (ref 26.0–34.0)
MCHC: 28.4 g/dL — ABNORMAL LOW (ref 30.0–36.0)
MCV: 96.8 fL (ref 80.0–100.0)
Monocytes Absolute: 1 10*3/uL (ref 0.1–1.0)
Monocytes Relative: 13 %
Neutro Abs: 6 10*3/uL (ref 1.7–7.7)
Neutrophils Relative %: 76 %
Platelets: 214 10*3/uL (ref 150–400)
RBC: 3.71 MIL/uL — ABNORMAL LOW (ref 3.87–5.11)
RDW: 15.3 % (ref 11.5–15.5)
WBC: 7.9 10*3/uL (ref 4.0–10.5)
nRBC: 0 % (ref 0.0–0.2)

## 2018-12-05 LAB — COMPREHENSIVE METABOLIC PANEL
ALT: 30 U/L (ref 0–44)
AST: 20 U/L (ref 15–41)
Albumin: 2.4 g/dL — ABNORMAL LOW (ref 3.5–5.0)
Alkaline Phosphatase: 60 U/L (ref 38–126)
Anion gap: 9 (ref 5–15)
BUN: 94 mg/dL — ABNORMAL HIGH (ref 8–23)
CO2: 31 mmol/L (ref 22–32)
Calcium: 8.6 mg/dL — ABNORMAL LOW (ref 8.9–10.3)
Chloride: 111 mmol/L (ref 98–111)
Creatinine, Ser: 1.69 mg/dL — ABNORMAL HIGH (ref 0.44–1.00)
GFR calc Af Amer: 37 mL/min — ABNORMAL LOW (ref >60)
GFR calc non Af Amer: 32 mL/min — ABNORMAL LOW (ref >60)
Glucose, Bld: 116 mg/dL — ABNORMAL HIGH (ref 70–99)
Potassium: 5.3 mmol/L — ABNORMAL HIGH (ref 3.5–5.1)
Sodium: 151 mmol/L — ABNORMAL HIGH (ref 135–145)
Total Bilirubin: 0.4 mg/dL (ref 0.3–1.2)
Total Protein: 6.9 g/dL (ref 6.5–8.1)

## 2018-12-05 LAB — GLUCOSE, CAPILLARY
Glucose-Capillary: 131 mg/dL — ABNORMAL HIGH (ref 70–99)
Glucose-Capillary: 105 mg/dL — ABNORMAL HIGH (ref 70–99)
Glucose-Capillary: 132 mg/dL — ABNORMAL HIGH (ref 70–99)
Glucose-Capillary: 120 mg/dL — ABNORMAL HIGH (ref 70–99)
Glucose-Capillary: 129 mg/dL — ABNORMAL HIGH (ref 70–99)

## 2018-12-05 LAB — TRIGLYCERIDES: Triglycerides: 96 mg/dL (ref <150)

## 2018-12-05 LAB — D-DIMER, QUANTITATIVE: D-Dimer, Quant: 2.11 ug/mL-FEU — ABNORMAL HIGH (ref 0.00–0.50)

## 2018-12-05 LAB — C-REACTIVE PROTEIN: CRP: 19.5 mg/dL — ABNORMAL HIGH (ref <1.0)

## 2018-12-05 MED ORDER — POLYETHYLENE GLYCOL 3350 17 G PO PACK
17.0000 g | PACK | Freq: Two times a day (BID) | ORAL | Status: DC
  Administered 2018-12-05 – 2018-12-09 (×8): 17 g via ORAL
  Filled 2018-12-05 (×8): qty 1

## 2018-12-05 MED ORDER — ARTIFICIAL TEARS OPHTHALMIC OINT
TOPICAL_OINTMENT | OPHTHALMIC | Status: DC | PRN
  Administered 2018-12-05 – 2018-12-10 (×5): via OPHTHALMIC
  Administered 2018-12-11: 1 via OPHTHALMIC
  Filled 2018-12-05 (×3): qty 3.5

## 2018-12-05 MED ORDER — CLONAZEPAM 1 MG PO TABS
2.0000 mg | ORAL_TABLET | Freq: Two times a day (BID) | ORAL | Status: DC
  Administered 2018-12-05: 2 mg via ORAL
  Filled 2018-12-05: qty 2

## 2018-12-05 MED ORDER — CLONAZEPAM 1 MG PO TABS
2.0000 mg | ORAL_TABLET | Freq: Two times a day (BID) | ORAL | Status: DC
  Administered 2018-12-05 – 2018-12-09 (×8): 2 mg
  Filled 2018-12-05 (×8): qty 2

## 2018-12-05 MED ORDER — HYDROMORPHONE HCL 2 MG PO TABS
2.0000 mg | ORAL_TABLET | ORAL | Status: DC
  Administered 2018-12-05 – 2018-12-09 (×23): 2 mg
  Filled 2018-12-05 (×23): qty 1

## 2018-12-05 NOTE — Progress Notes (Addendum)
RN called daughter Shanty Ginty at phone number listed in patient's chart. Patient's daughter did not answer, RN left voice message with instructions to call back with update.  1430 Patient's daughter Lelon Huh called back, update given per RN.

## 2018-12-05 NOTE — Progress Notes (Signed)
NAME:  Shelley Allison, MRN:  [id number], DOB:  1955-06-16, LOS: 20 ADMISSION DATE:  12-10-2018, CONSULTATION DATE:  10/16 REFERRING MD:  Cruzita Lederer, CHIEF COMPLAINT:  Dyspnea   Brief History   63 year old female admitted on 2018-12-10 in the setting of worsening hypoxemic respiratory failure from COVID-19 pneumonia.   Past Medical History  HTN Cerebral artery aneurysm rupture  Significant Hospital Events   09/28 admit, start decadron/remdesivir 09/29 given tocilizumab in setting of worsening hypoxia; pt deferred option for convalescent plasma 09/30 transfer to ICU, PCCM consulted 10/03 transfer back to PCU 10/16 PCCM reconsulted for presistent hypoxia; restart solumedrol  10/17 transferred back to ICU with AMS and hypoxia, required intubation 10/18 restart remdesivir  Consults:  PCCM  Procedures:  ETT 10/17 >> Rt radial a line 10/18 >>   Significant Diagnostic Tests:  Doppler b/l legs 10/01 >> no DVT CT angio chest 10/03 >> borderline LAN up to 1.5 cm, b/l patchy consolidation and GGO Echo 10/16 >> EF 70 to 75%, PAS 50 mmHg, RV size and function normal CT head 10/17 >> s/p Lt frontotemporal craniotomy, encephalomalacia Lt temporal lobe, chronic small posterior temporal cortical infarct, Lt aneurysm clipping EEG 10/19 >> mild general background slowing  Micro Data:  SARS COV2 9/28 >> POSITIVE Sputum 10/18 >> Urine 10/18 >> negative  Antimicrobials/COVID Rx:  Remdesivir 9/28 >> 10/02 Solumedrol 9/28 >> 10/07 Tocilizumab 9/29  Solumedrol 10/16 >> 10/18 Remdesivir 10/18 >>  Decadron 10/18 >>   Interim history/subjective:  Intubated over weekend Remains mechanically ventilated   Objective   Blood pressure (!) 110/56, pulse 62, temperature 97.7 F (36.5 C), temperature source Axillary, resp. rate (!) 26, height 5\' 5"  (1.651 m), weight 91.8 kg, SpO2 95 %.    Vent Mode: PRVC FiO2 (%):  [50 %-60 %] 50 % Set Rate:  [35 bmp] 35 bmp Vt Set:  [400 mL] 400 mL  PEEP:  [10 cmH20] 10 cmH20 Plateau Pressure:  [19 cmH20-37 cmH20] 31 cmH20   Intake/Output Summary (Last 24 hours) at 12/05/2018 0858 Last data filed at 12/05/2018 0800 Gross per 24 hour  Intake 3666.54 ml  Output 3265 ml  Net 401.54 ml   Filed Weights   12/03/18 0500 12/04/18 0500 12/05/18 0500  Weight: 92.1 kg 91.7 kg 91.8 kg    Examination:  General:  In bed on vent HENT: NCAT ETT in place PULM: Crackles bases B, vent supported breathing CV: RRR, no mgr GI: BS+, soft, nontender MSK: normal bulk and tone Neuro: sedated on vent  10/21 CXR > ETT in place interstitial edema, consolidation in [facility] Problem list     Assessment & Plan:  Acute hypoxemic/hypercapnic respiratory failure in setting of COVID-19 pneumonia. Concern for fibrotic change of lung post COVID Continue mechanical ventilation per ARDS protocol Target TVol 6-8cc/kgIBW Target Plateau Pressure < 30cm H20 Target driving pressure less than 15 cm of water Target PaO2 55-65: titrate PEEP/FiO2 per protocol As long as PaO2 to FiO2 ratio is less than 1:150 position in prone position for 16 hours a day Check CVP daily if CVL in place Target CVP less than 4, diurese as necessary Ventilator associated pneumonia prevention protocol Consider HRCT  Acute metabolic encephalopathy Severe ventilator dyssynchrony Add orals: dilaudid and clonazepam RASS goal -2 Wean off fentanyl and propofol  AKI: improving Monitor BMET and UOP Replace electrolytes as needed   Best practice:  Diet: tube feeding Pain/Anxiety/Delirium protocol (if indicated): yes, RASS target -1 to -2, as above VAP  protocol (if indicated): yes DVT prophylaxis: lovenox per protocol GI prophylaxis: pantoprazole Glucose control: per TRH Mobility: bed rest Code Status: full Family Communication: per [facility] Disposition: remain in ICU  Labs   CBC: Recent Labs  Lab 12/01/18 2000  12/02/18 0628 12/02/18 0830  12/03/18 0509  12/03/18 0805 12/04/18 0427 12/04/18 0500 12/05/18 0500  WBC 15.3*  --  11.3* 11.3*  --  10.7*  --   --  7.1 7.9  NEUTROABS 12.0*  --   --  10.1*  --  9.3*  --   --  5.8 6.0  HGB 13.5   < > 12.5 11.9*   < > 11.3* 11.6* 10.5* 10.3* 10.2*  HCT 44.3   < > 41.4 39.2   < > 37.7 34.0* 31.0* 34.8* 35.9*  MCV 92.1  --  92.8 92.2  --  93.3  --   --  94.6 96.8  PLT 267  --  233 231  --  229  --   --  198 214   < > = values in this interval not displayed.    Basic Metabolic Panel: Recent Labs  Lab 12/02/18 0150  12/02/18 1820 12/02/18 1938 12/03/18 0509 12/03/18 0805 12/03/18 1800 12/04/18 0427 12/04/18 0500 12/05/18 0500  NA 144   < > 142  --  142 144  --  145 148* 151*  K 5.5*   < > 5.0  --  4.5 4.2  --  4.8 4.8 5.3*  CL 103  --  103  --  103  --   --   --  109 111  CO2 26  --  28  --  29  --   --   --  31 31  GLUCOSE 106*  --  145*  --  147*  --   --   --  142* 116*  BUN 59*  --  69*  --  72*  --   --   --  88* 94*  CREATININE 2.05*  --  2.14*  --  1.99*  --   --   --  1.88* 1.69*  CALCIUM 9.1  --  8.5*  --  8.6*  --   --   --  8.6* 8.6*  MG 3.0*  --   --  3.1* 3.2*  --  2.9*  --  3.4*  --   PHOS 8.0*  --   --  5.5* 4.6  --  4.1  --  3.8  --    < > = values in this interval not displayed.   GFR: Estimated Creatinine Clearance: 38.1 mL/min (A) (by C-G formula based on SCr of 1.69 mg/dL (H)). Recent Labs  Lab 12/01/18 2000 12/01/18 2305  12/02/18 0830 12/03/18 0509 12/04/18 0500 12/05/18 0500  PROCALCITON 0.23  --   --   --  0.42 0.15  --   WBC 15.3*  --    < > 11.3* 10.7* 7.1 7.9  LATICACIDVEN 1.3 1.6  --  1.5  --   --   --    < > = values in this interval not displayed.    Liver Function Tests: Recent Labs  Lab 11/29/18 0040 12/01/18 2000 12/03/18 0509 12/04/18 0500 12/05/18 0500  AST 26 29 19 22 20   ALT 41 45* 40 36 30  ALKPHOS 67 88 71 64 60  BILITOT 0.8 0.8 0.3 0.4 0.4  PROT 6.1* 7.7 6.8 6.5 6.9  ALBUMIN 2.7* 3.1* 2.6* 2.3* 2.4*  No results for  input(s): LIPASE, AMYLASE in the last 168 hours. No results for input(s): AMMONIA in the last 168 hours.  ABG    Component Value Date/Time   PHART 7.331 (L) 12/04/2018 0427   PCO2ART 59.7 (H) 12/04/2018 0427   PO2ART 62.0 (L) 12/04/2018 0427   HCO3 31.6 (H) 12/04/2018 0427   TCO2 33 (H) 12/04/2018 0427   O2SAT 89.0 12/04/2018 0427     Coagulation Profile: No results for input(s): INR, PROTIME in the last 168 hours.  Cardiac Enzymes: No results for input(s): CKTOTAL, CKMB, CKMBINDEX, TROPONINI in the last 168 hours.  HbA1C: Hemoglobin A1C  Date/Time Value Ref Range Status  05/15/2015 03:52 PM 5.5  Final  04/25/2014 04:43 PM 5.8  Final   Hgb A1c MFr Bld  Date/Time Value Ref Range Status  12/02/2018 08:38 AM 6.4 (H) 4.8 - 5.6 % Final    Comment:    (NOTE)         Prediabetes: 5.7 - 6.4         Diabetes: >6.4         Glycemic control for adults with diabetes: <7.0     CBG: Recent Labs  Lab 12/04/18 1605 12/04/18 2006 12/04/18 2347 12/05/18 0414 12/05/18 0736  GLUCAP 143* 168* 215* 131* 105*     Critical care time: 35 minutes       Heber CarolinaBrent Sameerah Nachtigal, MD [facility] Pager: [phone] Cell: [phone] If no response, call [phone]

## 2018-12-05 NOTE — Progress Notes (Signed)
ETT moved to 26cm per MD verbal order.

## 2018-12-05 NOTE — Progress Notes (Addendum)
[facility] TEAM 1 - Stepdown/ICU TEAM  Shelley Allison  XNA:[id number] DOB: 1955/09/13 DOA: 12-06-18 PCP: Richarda Osmond, DO    Brief Narrative:  [phone] with a history of HTN and cerebral artery aneurysm rupture who presented to the ED 9/28 with 1 week of nausea vomiting and diarrhea followed by progressive shortness of breath with fever which began on 9/27.  She had attended a large wedding prior to the onset of her symptoms.  In the ED she was found to be hypoxic and confirmed to be SARS-CoV-2 positive.  Chest x-ray revealed bilateral infiltrates.  Significant Events: 9/21 approximate onset of GI symptoms 9/27 onset of pulmonary symptoms 9/28 admit  9/30 transfer to ICU 10/3 transfer back to PCU 10/16 TTE 10/17 transfer to ICU with respiratory distress/obtundation -intubation  COVID-19 specific Treatment: Remdesivir 9/28 >10/2 Solumedrol 9/28 > 10/7 Actemra 9/29 Convalescent plasma - patient declined Solumedrol 10/16 > 10/18 Remdesivir 10/18 >  Decadron 10/18 >   Subjective: The patient is known to me from earlier in her hospital stay.  At the time of my exam today she is sedated on the ventilator.  Her respirations appear comfortable.  There is no evidence of distress or uncontrolled pain.  Assessment & Plan:  COVID pneumonia - acute hypoxic respiratory failure CT angio chest negative for PE 10/3 -bilateral lower extremity venous duplex negative for DVT - completed full courses of Solu-Medrol, Remdesivir, and Actemra treatment during initial portion of hospital stay -presently on extended repeat dose of remdesivir and Decadron  Acute encephalopathy Patient was found in her room 10/17 acutely altered with severe hypoxia -though her hypoxia improved with increased oxygen support her mental status did not initially required urgent intubation -an EEG revealed no evidence of seizure -CT head was unrevealing  HTN Blood pressure controlled  Acute kidney injury Creatinine  peaked at 2.14 -continue to follow intermittently -has been downtrending over the last 72 hours  Recent Labs  Lab 12/02/18 0150 12/02/18 1820 12/03/18 0509 12/04/18 0500 12/05/18 0500  CREATININE 2.05* 2.14* 1.99* 1.88* 1.69*    Transaminitis resolved - likely due to the virus itself +/- Remdesivir  History of ruptured cerebral artery aneurysm  DVT prophylaxis: Lovenox Code Status: FULL CODE Family Communication: I called and spoke w/ the patient's daughter and mother on speakerphone  Disposition Plan: Discharge home when oxygen requirement allows -high risk for failure and immediate return if attempts made to discharge home today  Consultants:  PCCM  Antimicrobials:  Ceftriaxone 9/28 > 10/1  Azithromycin 9/28 > 9/29  Objective: Blood pressure (!) 110/56, pulse 62, temperature 97.7 F (36.5 C), temperature source Axillary, resp. rate (!) 26, height 5\' 5"  (1.651 m), weight 91.8 kg, SpO2 95 %.  Intake/Output Summary (Last 24 hours) at 12/05/2018 0901 Last data filed at 12/05/2018 0800 Gross per 24 hour  Intake 2642.23 ml  Output 3265 ml  Net -622.77 ml   Filed Weights   12/03/18 0500 12/04/18 0500 12/05/18 0500  Weight: 92.1 kg 91.7 kg 91.8 kg    Examination: General: No acute respiratory distress -sedated Lungs: Fine crackles diffusely Cardiovascular: RRR without murmur Abdomen: NT/ND, soft, BS+ Extremities: Trace edema bilateral lower extremities without cyanosis or clubbing  CBC: Recent Labs  Lab 12/03/18 0509  12/04/18 0427 12/04/18 0500 12/05/18 0500  WBC 10.7*  --   --  7.1 7.9  NEUTROABS 9.3*  --   --  5.8 6.0  HGB 11.3*   < > 10.5* 10.3* 10.2*  HCT 37.7   < >  31.0* 34.8* 35.9*  MCV 93.3  --   --  94.6 96.8  PLT 229  --   --  198 214   < > = values in this interval not displayed.   Basic Metabolic Panel: Recent Labs  Lab 12/03/18 0509  12/03/18 1800 12/04/18 0427 12/04/18 0500 12/05/18 0500  NA 142   < >  --  145 148* 151*  K 4.5   < >   --  4.8 4.8 5.3*  CL 103  --   --   --  109 111  CO2 29  --   --   --  31 31  GLUCOSE 147*  --   --   --  142* 116*  BUN 72*  --   --   --  88* 94*  CREATININE 1.99*  --   --   --  1.88* 1.69*  CALCIUM 8.6*  --   --   --  8.6* 8.6*  MG 3.2*  --  2.9*  --  3.4*  --   PHOS 4.6  --  4.1  --  3.8  --    < > = values in this interval not displayed.   GFR: Estimated Creatinine Clearance: 38.1 mL/min (A) (by C-G formula based on SCr of 1.69 mg/dL (H)).  Liver Function Tests: Recent Labs  Lab 12/01/18 2000 12/03/18 0509 12/04/18 0500 12/05/18 0500  AST 29 19 22 20   ALT 45* 40 36 30  ALKPHOS 88 71 64 60  BILITOT 0.8 0.3 0.4 0.4  PROT 7.7 6.8 6.5 6.9  ALBUMIN 3.1* 2.6* 2.3* 2.4*    HbA1C: Hemoglobin A1C  Date/Time Value Ref Range Status  05/15/2015 03:52 PM 5.5  Final  04/25/2014 04:43 PM 5.8  Final   Hgb A1c MFr Bld  Date/Time Value Ref Range Status  12/02/2018 08:38 AM 6.4 (H) 4.8 - 5.6 % Final    Comment:    (NOTE)         Prediabetes: 5.7 - 6.4         Diabetes: >6.4         Glycemic control for adults with diabetes: <7.0     Scheduled Meds: . chlorhexidine  15 mL Mouth/Throat BID  . Chlorhexidine Gluconate Cloth  6 each Topical Daily  . dexamethasone (DECADRON) injection  6 mg Intravenous Q24H  . enoxaparin (LOVENOX) injection  40 mg Subcutaneous Q12H  . feeding supplement (PRO-STAT SUGAR FREE 64)  60 mL Per Tube TID  . free water  100 mL Per Tube Q8H  . insulin aspart  0-15 Units Subcutaneous Q4H  . mouth rinse  15 mL Mouth Rinse 10 times per day  . pantoprazole sodium  40 mg Per Tube Daily  . polyethylene glycol  17 g Oral Daily     LOS: 23 days   12/04/2018, MD [facility] Office  [phone] Pager - Text Page per Amion  If 7PM-7AM, please contact night-coverage per Amion 12/05/2018, 9:01 AM

## 2018-12-06 ENCOUNTER — Inpatient Hospital Stay (HOSPITAL_COMMUNITY): Payer: BC Managed Care – PPO

## 2018-12-06 ENCOUNTER — Inpatient Hospital Stay: Payer: Self-pay

## 2018-12-06 LAB — COMPREHENSIVE METABOLIC PANEL
ALT: 24 U/L (ref 0–44)
AST: 15 U/L (ref 15–41)
Albumin: 2.4 g/dL — ABNORMAL LOW (ref 3.5–5.0)
Alkaline Phosphatase: 72 U/L (ref 38–126)
Anion gap: 6 (ref 5–15)
BUN: 119 mg/dL — ABNORMAL HIGH (ref 8–23)
CO2: 31 mmol/L (ref 22–32)
Calcium: 8.7 mg/dL — ABNORMAL LOW (ref 8.9–10.3)
Chloride: 114 mmol/L — ABNORMAL HIGH (ref 98–111)
Creatinine, Ser: 1.72 mg/dL — ABNORMAL HIGH (ref 0.44–1.00)
GFR calc Af Amer: 36 mL/min — ABNORMAL LOW (ref >60)
GFR calc non Af Amer: 31 mL/min — ABNORMAL LOW (ref >60)
Glucose, Bld: 154 mg/dL — ABNORMAL HIGH (ref 70–99)
Potassium: 5.5 mmol/L — ABNORMAL HIGH (ref 3.5–5.1)
Sodium: 151 mmol/L — ABNORMAL HIGH (ref 135–145)
Total Bilirubin: 0.1 mg/dL — ABNORMAL LOW (ref 0.3–1.2)
Total Protein: 6.8 g/dL (ref 6.5–8.1)

## 2018-12-06 LAB — CBC WITH DIFFERENTIAL/PLATELET
Abs Immature Granulocytes: 0.06 10*3/uL (ref 0.00–0.07)
Basophils Absolute: 0 10*3/uL (ref 0.0–0.1)
Basophils Relative: 0 %
Eosinophils Absolute: 0.3 10*3/uL (ref 0.0–0.5)
Eosinophils Relative: 3 %
HCT: 37.8 % (ref 36.0–46.0)
Hemoglobin: 10.6 g/dL — ABNORMAL LOW (ref 12.0–15.0)
Immature Granulocytes: 1 %
Lymphocytes Relative: 12 %
Lymphs Abs: 1.1 10*3/uL (ref 0.7–4.0)
MCH: 28 pg (ref 26.0–34.0)
MCHC: 28 g/dL — ABNORMAL LOW (ref 30.0–36.0)
MCV: 100 fL (ref 80.0–100.0)
Monocytes Absolute: 1.2 10*3/uL — ABNORMAL HIGH (ref 0.1–1.0)
Monocytes Relative: 13 %
Neutro Abs: 6.7 10*3/uL (ref 1.7–7.7)
Neutrophils Relative %: 71 %
Platelets: 251 10*3/uL (ref 150–400)
RBC: 3.78 MIL/uL — ABNORMAL LOW (ref 3.87–5.11)
RDW: 15.6 % — ABNORMAL HIGH (ref 11.5–15.5)
WBC: 9.4 10*3/uL (ref 4.0–10.5)
nRBC: 0 % (ref 0.0–0.2)

## 2018-12-06 LAB — POCT I-STAT 7, (LYTES, BLD GAS, ICA,H+H)
Acid-Base Excess: 2 mmol/L (ref 0.0–2.0)
Bicarbonate: 31.5 mmol/L — ABNORMAL HIGH (ref 20.0–28.0)
Calcium, Ion: 1.26 mmol/L (ref 1.15–1.40)
HCT: 33 % — ABNORMAL LOW (ref 36.0–46.0)
Hemoglobin: 11.2 g/dL — ABNORMAL LOW (ref 12.0–15.0)
O2 Saturation: 77 %
Patient temperature: 36.9
Potassium: 5.4 mmol/L — ABNORMAL HIGH (ref 3.5–5.1)
Sodium: 151 mmol/L — ABNORMAL HIGH (ref 135–145)
TCO2: 34 mmol/L — ABNORMAL HIGH (ref 22–32)
pCO2 arterial: 76.8 mmHg (ref 32.0–48.0)
pH, Arterial: 7.22 — ABNORMAL LOW (ref 7.350–7.450)
pO2, Arterial: 51 mmHg — ABNORMAL LOW (ref 83.0–108.0)

## 2018-12-06 LAB — GLUCOSE, CAPILLARY
Glucose-Capillary: 112 mg/dL — ABNORMAL HIGH (ref 70–99)
Glucose-Capillary: 120 mg/dL — ABNORMAL HIGH (ref 70–99)
Glucose-Capillary: 120 mg/dL — ABNORMAL HIGH (ref 70–99)
Glucose-Capillary: 137 mg/dL — ABNORMAL HIGH (ref 70–99)
Glucose-Capillary: 155 mg/dL — ABNORMAL HIGH (ref 70–99)
Glucose-Capillary: 172 mg/dL — ABNORMAL HIGH (ref 70–99)
Glucose-Capillary: 181 mg/dL — ABNORMAL HIGH (ref 70–99)

## 2018-12-06 LAB — TRIGLYCERIDES: Triglycerides: 130 mg/dL (ref <150)

## 2018-12-06 LAB — C-REACTIVE PROTEIN: CRP: 18.9 mg/dL — ABNORMAL HIGH (ref <1.0)

## 2018-12-06 LAB — D-DIMER, QUANTITATIVE: D-Dimer, Quant: 2.12 ug/mL-FEU — ABNORMAL HIGH (ref 0.00–0.50)

## 2018-12-06 MED ORDER — SODIUM CHLORIDE 0.9% FLUSH: 10.0000 mL | INTRAVENOUS | Status: DC | PRN

## 2018-12-06 MED ORDER — SODIUM CHLORIDE 0.9% FLUSH
10.0000 mL | Freq: Two times a day (BID) | INTRAVENOUS | Status: DC
  Administered 2018-12-06 – 2018-12-12 (×12): 10 mL

## 2018-12-06 MED ORDER — SODIUM ZIRCONIUM CYCLOSILICATE 10 G PO PACK
10.0000 g | PACK | Freq: Once | ORAL | Status: AC
  Administered 2018-12-06: 12:00:00 10 g via ORAL
  Filled 2018-12-06: qty 1

## 2018-12-06 MED ORDER — PIVOT 1.5 CAL PO LIQD
1000.0000 mL | ORAL | Status: DC
  Administered 2018-12-06 – 2018-12-07 (×2): 1000 mL

## 2018-12-06 MED ORDER — ROCURONIUM BROMIDE 10 MG/ML (PF) SYRINGE
90.0000 mg | PREFILLED_SYRINGE | Freq: Once | INTRAVENOUS | Status: AC
  Administered 2018-12-06: 90 mg via INTRAVENOUS
  Filled 2018-12-06: qty 10

## 2018-12-06 MED ORDER — FREE WATER
200.0000 mL | Freq: Four times a day (QID) | Status: DC
  Administered 2018-12-06: 200 mL

## 2018-12-06 MED ORDER — ROCURONIUM BROMIDE 50 MG/5ML IV SOLN
90.0000 mg | Freq: Once | INTRAVENOUS | Status: DC
  Filled 2018-12-06: qty 9

## 2018-12-06 MED ORDER — FREE WATER
300.0000 mL | Freq: Four times a day (QID) | Status: DC
  Administered 2018-12-06 – 2018-12-08 (×7): 300 mL

## 2018-12-06 NOTE — Progress Notes (Signed)
Peripherally Inserted Central Catheter/Midline Placement  The IV Nurse has discussed with the patient and/or persons authorized to consent for the patient, the purpose of this procedure and the potential benefits and risks involved with this procedure.  The benefits include less needle sticks, lab draws from the catheter, and the patient may be discharged home with the catheter. Risks include, but not limited to, infection, bleeding, blood clot (thrombus formation), and puncture of an artery; nerve damage and irregular heartbeat and possibility to perform a PICC exchange if needed/ordered by physician.  Alternatives to this procedure were also discussed.  Bard Power PICC patient education guide, fact sheet on infection prevention and patient information card has been provided to patient /or left at bedside.    PICC/Midline Placement Documentation  PICC Triple Lumen 12/06/18 PICC Right Brachial 40 cm 0 cm (Active)  Indication for Insertion or Continuance of Line Vasoactive infusions 12/06/18 1800  Exposed Catheter (cm) 0 cm 12/06/18 1800  Site Assessment Clean;Dry;Intact 12/06/18 1800  Lumen #1 Status Flushed;Saline locked;Blood return noted 12/06/18 1800  Lumen #2 Status Flushed;Saline locked;Blood return noted 12/06/18 1800  Lumen #3 Status Flushed;Saline locked;Blood return noted 12/06/18 1800  Dressing Type Transparent;Securing device 12/06/18 1800  Dressing Status Clean;Dry;Intact;Antimicrobial disc in place 12/06/18 1800  Line Care Connections checked and tightened 12/06/18 1800  Dressing Intervention New dressing;Other (Comment) 12/06/18 1800  Dressing Change Due 12/26/18 12/06/18 1800   Patient daughter Shiesha Jahn gave phone consent with 2 RN's witnessed    Virgilio Belling 12/06/2018, 6:19 PM

## 2018-12-06 NOTE — Progress Notes (Signed)
NAME:  Shelley Allison, MRN:  [id number], DOB:  [birth year], LOS: 24 ADMISSION DATE:  12/12/18, CONSULTATION DATE:  10/16 REFERRING MD:  Cruzita Lederer, CHIEF COMPLAINT:  Dyspnea   Brief History   63 year old female admitted on December 12, 2018 in the setting of worsening hypoxemic respiratory failure from COVID-19 pneumonia.   Past Medical History  HTN Cerebral artery aneurysm rupture  Significant Hospital Events   09/28 admit, start decadron/remdesivir 09/29 given tocilizumab in setting of worsening hypoxia; pt deferred option for convalescent plasma 09/30 transfer to ICU, PCCM consulted 10/03 transfer back to PCU 10/16 PCCM reconsulted for presistent hypoxia; restart solumedrol  10/17 transferred back to ICU with AMS and hypoxia, required intubation 10/18 restart remdesivir  Consults:  PCCM  Procedures:  ETT 10/17 >> Rt radial a line 10/18 >>   Significant Diagnostic Tests:  Doppler b/l legs 10/01 >> no DVT CT angio chest 10/03 >> borderline LAN up to 1.5 cm, b/l patchy consolidation and GGO Echo 10/16 >> EF 70 to 75%, PAS 50 mmHg, RV size and function normal CT head 10/17 >> s/p Lt frontotemporal craniotomy, encephalomalacia Lt temporal lobe, chronic small posterior temporal cortical infarct, Lt aneurysm clipping EEG 10/19 >> mild general background slowing  Micro Data:  SARS COV2 9/28 >> POSITIVE Sputum 10/18 >> Urine 10/18 >> negative  Antimicrobials/COVID Rx:  Remdesivir 9/28 >> 10/02 Solumedrol 9/28 >> 10/07 Tocilizumab 9/29  Solumedrol 10/16 >> 10/18 Remdesivir 10/18 >>  Decadron 10/18 >>   Interim history/subjective:   Worsening hypercarbia, levophed requirements increased overnight   Objective   Blood pressure (!) 109/53, pulse (!) 104, temperature (!) 96.3 F (35.7 C), temperature source Axillary, resp. rate (!) 36, height 5\' 5"  (1.651 m), weight 92.3 kg, SpO2 94 %.    Vent Mode: PRVC FiO2 (%):  [50 %-60 %] 60 % Set Rate:  [35 bmp] 35 bmp Vt Set:   [400 mL] 400 mL PEEP:  [10 cmH20] 10 cmH20 Plateau Pressure:  [23 cmH20-36 cmH20] 34 cmH20   Intake/Output Summary (Last 24 hours) at 12/06/2018 0840 Last data filed at 12/06/2018 0600 Gross per 24 hour  Intake 3803.8 ml  Output 900 ml  Net 2903.8 ml   Filed Weights   12/04/18 0500 12/05/18 0500 12/06/18 0500  Weight: 91.7 kg 91.8 kg 92.3 kg    Examination:  General:  In bed on vent HENT: NCAT ETT in place PULM: Crackles bases B, vent supported breathing CV: RRR, no mgr GI: BS+, soft, nontender MSK: normal bulk and tone Neuro: sedated on vent   10/22 CXR >personally reviewed: small left pneumothorax, bilateral air space disease  Resolved Hospital Problem list     Assessment & Plan:  Acute hypoxemic/hypercapnic respiratory failure in setting of COVID-19 pneumonia. Concern for fibrotic change of lung post COVID Continue mechanical ventilation per ARDS protocol Target TVol 6-8cc/kgIBW Target Plateau Pressure < 30cm H20 Target driving pressure less than 15 cm of water Target PaO2 55-65: titrate PEEP/FiO2 per protocol As long as PaO2 to FiO2 ratio is less than 1:150 position in prone position for 16 hours a day Check CVP daily if CVL in place Target CVP less than 4, diurese as necessary Ventilator associated pneumonia prevention protocol 10/22: stop decadron, not helping and several trials suggest late phase steroids are harmful in ARDS, prone positioning, repeat ABG after prone  Shock/hypotension: related to pneumothorax? PICC line Levophed for MAP>65  Pneumothorax, new problem on 10/22 Place chest tube, -20 cm suction Daily CXR  Acute metabolic encephalopathy  Severe ventilator dyssynchrony Continue dilaudid and clonazepam RASS target -3 Continue fentanyl/propofol per PAD protocol  AKI: stable, some oliguria and hypernatremia Monitor BMET and UOP Replace electrolytes as needed Free water per [facility]   Best practice:  Diet: tube feeding Pain/Anxiety/Delirium  protocol (if indicated): yes, RASS target -3 VAP protocol (if indicated): yes DVT prophylaxis: lovenox per protocol GI prophylaxis: pantoprazole Glucose control: per TRH Mobility: bed rest Code Status: full Family Communication: per [facility] Disposition: remain in ICU  Labs   CBC: Recent Labs  Lab 12/02/18 0830  12/03/18 0509  12/04/18 0427 12/04/18 0500 12/05/18 0500 12/06/18 0428 12/06/18 0545  WBC 11.3*  --  10.7*  --   --  7.1 7.9  --  9.4  NEUTROABS 10.1*  --  9.3*  --   --  5.8 6.0  --  6.7  HGB 11.9*   < > 11.3*   < > 10.5* 10.3* 10.2* 11.2* 10.6*  HCT 39.2   < > 37.7   < > 31.0* 34.8* 35.9* 33.0* 37.8  MCV 92.2  --  93.3  --   --  94.6 96.8  --  100.0  PLT 231  --  229  --   --  198 214  --  251   < > = values in this interval not displayed.    Basic Metabolic Panel: Recent Labs  Lab 12/02/18 0150  12/02/18 1820 12/02/18 1938 12/03/18 0509  12/03/18 1800 12/04/18 0427 12/04/18 0500 12/05/18 0500 12/06/18 0428 12/06/18 0545  NA 144   < > 142  --  142   < >  --  145 148* 151* 151* 151*  K 5.5*   < > 5.0  --  4.5   < >  --  4.8 4.8 5.3* 5.4* 5.5*  CL 103  --  103  --  103  --   --   --  109 111  --  114*  CO2 26  --  28  --  29  --   --   --  31 31  --  31  GLUCOSE 106*  --  145*  --  147*  --   --   --  142* 116*  --  154*  BUN 59*  --  69*  --  72*  --   --   --  88* 94*  --  119*  CREATININE 2.05*  --  2.14*  --  1.99*  --   --   --  1.88* 1.69*  --  1.72*  CALCIUM 9.1  --  8.5*  --  8.6*  --   --   --  8.6* 8.6*  --  8.7*  MG 3.0*  --   --  3.1* 3.2*  --  2.9*  --  3.4*  --   --   --   PHOS 8.0*  --   --  5.5* 4.6  --  4.1  --  3.8  --   --   --    < > = values in this interval not displayed.   GFR: Estimated Creatinine Clearance: 37.6 mL/min (A) (by C-G formula based on SCr of 1.72 mg/dL (H)). Recent Labs  Lab 12/01/18 2000 12/01/18 2305  12/02/18 0830 12/03/18 0509 12/04/18 0500 12/05/18 0500 12/06/18 0545  PROCALCITON 0.23  --   --   --  0.42  0.15  --   --   WBC 15.3*  --    < >  11.3* 10.7* 7.1 7.9 9.4  LATICACIDVEN 1.3 1.6  --  1.5  --   --   --   --    < > = values in this interval not displayed.    Liver Function Tests: Recent Labs  Lab 12/01/18 2000 12/03/18 0509 12/04/18 0500 12/05/18 0500 12/06/18 0545  AST 29 19 22 20 15   ALT 45* 40 36 30 24  ALKPHOS 88 71 64 60 72  BILITOT 0.8 0.3 0.4 0.4 0.1*  PROT 7.7 6.8 6.5 6.9 6.8  ALBUMIN 3.1* 2.6* 2.3* 2.4* 2.4*   No results for input(s): LIPASE, AMYLASE in the last 168 hours. No results for input(s): AMMONIA in the last 168 hours.  ABG    Component Value Date/Time   PHART 7.220 (L) 12/06/2018 0428   PCO2ART 76.8 (HH) 12/06/2018 0428   PO2ART 51.0 (L) 12/06/2018 0428   HCO3 31.5 (H) 12/06/2018 0428   TCO2 34 (H) 12/06/2018 0428   O2SAT 77.0 12/06/2018 0428     Coagulation Profile: No results for input(s): INR, PROTIME in the last 168 hours.  Cardiac Enzymes: No results for input(s): CKTOTAL, CKMB, CKMBINDEX, TROPONINI in the last 168 hours.  HbA1C: Hemoglobin A1C  Date/Time Value Ref Range Status  05/15/2015 03:52 PM 5.5  Final  04/25/2014 04:43 PM 5.8  Final   Hgb A1c MFr Bld  Date/Time Value Ref Range Status  12/02/2018 08:38 AM 6.4 (H) 4.8 - 5.6 % Final    Comment:    (NOTE)         Prediabetes: 5.7 - 6.4         Diabetes: >6.4         Glycemic control for adults with diabetes: <7.0     CBG: Recent Labs  Lab 12/05/18 1542 12/05/18 1935 12/06/18 0035 12/06/18 0342 12/06/18 0729  GLUCAP 120* 129* 172* 137* 120*     Critical care time: 33 minutes       Heber CarolinaBrent , MD [place] [facility] Pager: [phone] Cell: [phone] If no response, call [phone]

## 2018-12-06 NOTE — Progress Notes (Signed)
Pt proned at 2125 as per MD order.  Padding placed to prevent breakdown on bony areas and proning pillow molded under patient's head to relieve any pressure to face along with sticky padding. Will follow progress and turn head as per protocol.

## 2018-12-06 NOTE — Progress Notes (Signed)
Pt with no urine output as of yet this shift.  Bladder scan performed with results of >570.  In and out cath was performed which resulted in 95ml of urine.  Pt tolerated procedure well.

## 2018-12-06 NOTE — Progress Notes (Addendum)
[place] TEAM 1 - Stepdown/ICU TEAM  Shelley Allison  HYQ:[id number] DOB: Jan 11, 1956 DOA: 10/17/2018 PCP: Richarda Osmond, DO    Brief Narrative:  [phone] with a history of HTN and cerebral artery aneurysm rupture who presented to the ED 9/28 with 1 week of nausea vomiting and diarrhea followed by progressive shortness of breath with fever which began on 9/27.  She had attended a large wedding prior to the onset of her symptoms.  In the ED she was found to be hypoxic and confirmed to be SARS-CoV-2 positive.  Chest x-ray revealed bilateral infiltrates.  Significant Events: 9/21 approximate onset of GI symptoms 9/27 onset of pulmonary symptoms 9/28 admit  9/30 transfer to ICU 10/3 transfer back to PCU 10/16 TTE 10/17 transfer to ICU with respiratory distress/obtundation -intubation 10/22 small R pneumothorax -chest tube placed  COVID-19 specific Treatment: Remdesivir 9/28 >10/2 Solumedrol 9/28 > 10/7 Actemra 9/29 Convalescent plasma - patient declined Solumedrol 10/16 > 10/18 Remdesivir 10/18 > 10/22 Decadron 10/18 > 10/22  Subjective: Sedated on ventilator.  Has developed progressive hypotension requiring up titration of vasopressors overnight.  ABG notes worsening hypercarbia.  Assessment & Plan:  COVID pneumonia - persisting acute hypoxic respiratory failure /acute hypercarbic respiratory failure CT angio chest negative for PE 10/3 - bilateral lower extremity venous duplex negative for DVT - completed full courses of Solu-Medrol, Remdesivir, and Actemra treatment during initial portion of hospital stay - presently on extended repeat dose of remdesivir and Decadron  Small right-sided pneumothorax Apparent on CXR today -chest tube placed per PCCM  Acute encephalopathy Patient was found in her room 10/17 acutely altered with severe hypoxia -though her hypoxia improved with increased oxygen support her mental status did not initially required urgent intubation -an EEG revealed  no evidence of seizure -CT head was unrevealing  HTN Blood pressure controlled  Acute kidney injury Creatinine peaked at 2.14 -continue to follow intermittently -has been downtrending over the last 72 hours  Recent Labs  Lab 12/02/18 1820 12/03/18 0509 12/04/18 0500 12/05/18 0500 12/06/18 0545  CREATININE 2.14* 1.99* 1.88* 1.69* 1.72*    Uremia BUN markedly elevated -second course of steroids being discontinued today -holding diuretic for now -gently expand free water and follow  Transaminitis resolved - likely due to the virus itself +/- Remdesivir  History of ruptured cerebral artery aneurysm  DVT prophylaxis: Lovenox Code Status: FULL CODE Family Communication: I called and spoke w/ the patient's daughter via telephone Disposition Plan: ICU  Consultants:  PCCM  Antimicrobials:  Ceftriaxone 9/28 > 10/1  Azithromycin 9/28 > 9/29  Objective: Blood pressure 117/63, pulse (!) 105, temperature (!) 96.3 F (35.7 C), temperature source Axillary, resp. rate (!) 35, height 5\' 5"  (1.651 m), weight 92.3 kg, SpO2 100 %.  Intake/Output Summary (Last 24 hours) at 12/06/2018 0816 Last data filed at 12/06/2018 0600 Gross per 24 hour  Intake 3803.8 ml  Output 900 ml  Net 2903.8 ml   Filed Weights   12/04/18 0500 12/05/18 0500 12/06/18 0500  Weight: 91.7 kg 91.8 kg 92.3 kg    Examination: General: Sedated on ventilator Lungs: Fine crackles diffusely with no appreciable change Cardiovascular: RRR -no rub or gallop appreciated Abdomen: NT/ND, soft, BS+ Extremities: No significant edema bilateral lower extremities  CBC: Recent Labs  Lab 12/04/18 0500 12/05/18 0500 12/06/18 0428 12/06/18 0545  WBC 7.1 7.9  --  9.4  NEUTROABS 5.8 6.0  --  6.7  HGB 10.3* 10.2* 11.2* 10.6*  HCT 34.8* 35.9* 33.0* 37.8  MCV 94.6 96.8  --  100.0  PLT 198 214  --  251   Basic Metabolic Panel: Recent Labs  Lab 12/03/18 0509  12/03/18 1800  12/04/18 0500 12/05/18 0500 12/06/18  0428 12/06/18 0545  NA 142   < >  --    < > 148* 151* 151* 151*  K 4.5   < >  --    < > 4.8 5.3* 5.4* 5.5*  CL 103  --   --   --  109 111  --  114*  CO2 29  --   --   --  31 31  --  31  GLUCOSE 147*  --   --   --  142* 116*  --  154*  BUN 72*  --   --   --  88* 94*  --  119*  CREATININE 1.99*  --   --   --  1.88* 1.69*  --  1.72*  CALCIUM 8.6*  --   --   --  8.6* 8.6*  --  8.7*  MG 3.2*  --  2.9*  --  3.4*  --   --   --   PHOS 4.6  --  4.1  --  3.8  --   --   --    < > = values in this interval not displayed.   GFR: Estimated Creatinine Clearance: 37.6 mL/min (A) (by C-G formula based on SCr of 1.72 mg/dL (H)).  Liver Function Tests: Recent Labs  Lab 12/03/18 0509 12/04/18 0500 12/05/18 0500 12/06/18 0545  AST 19 22 20 15   ALT 40 36 30 24  ALKPHOS 71 64 60 72  BILITOT 0.3 0.4 0.4 0.1*  PROT 6.8 6.5 6.9 6.8  ALBUMIN 2.6* 2.3* 2.4* 2.4*    HbA1C: Hemoglobin A1C  Date/Time Value Ref Range Status  05/15/2015 03:52 PM 5.5  Final  04/25/2014 04:43 PM 5.8  Final   Hgb A1c MFr Bld  Date/Time Value Ref Range Status  12/02/2018 08:38 AM 6.4 (H) 4.8 - 5.6 % Final    Comment:    (NOTE)         Prediabetes: 5.7 - 6.4         Diabetes: >6.4         Glycemic control for adults with diabetes: <7.0     Scheduled Meds: . chlorhexidine  15 mL Mouth/Throat BID  . Chlorhexidine Gluconate Cloth  6 each Topical Daily  . clonazePAM  2 mg Per Tube BID  . dexamethasone (DECADRON) injection  6 mg Intravenous Q24H  . enoxaparin (LOVENOX) injection  40 mg Subcutaneous Q12H  . feeding supplement (PRO-STAT SUGAR FREE 64)  60 mL Per Tube TID  . free water  100 mL Per Tube Q8H  . HYDROmorphone  2 mg Per Tube Q4H  . insulin aspart  0-15 Units Subcutaneous Q4H  . mouth rinse  15 mL Mouth Rinse 10 times per day  . pantoprazole sodium  40 mg Per Tube Daily  . polyethylene glycol  17 g Oral BID     LOS: 24 days   12/04/2018, MD [facility] Office  [phone] Pager -  Text Page per Amion  If 7PM-7AM, please contact night-coverage per Amion 12/06/2018, 8:16 AM

## 2018-12-06 NOTE — Progress Notes (Signed)
RN updated patient's daughter Lelon Huh via phone on patient's condition.

## 2018-12-06 NOTE — Procedures (Signed)
Chest Tube Insertion Procedure Note  Indications:  Clinically significant Pneumothorax  Pre-operative Diagnosis: Pneumothorax  Post-operative Diagnosis: Pneumothorax  Procedure Details  Informed consent was obtained for the procedure, including sedation.  Risks of lung perforation, hemorrhage, arrhythmia, and adverse drug reaction were discussed.   After sterile skin prep, using standard technique, a 28 French tube was placed in the left lateral 7th rib space.  Findings: None  Estimated Blood Loss:  Minimal         Specimens:  None              Complications:  None; patient tolerated the procedure well.         Disposition: ICU - intubated and hemodynamically stable.         Condition: stable  Attending Attestation: I performed the procedure.  Roselie Awkward, MD [facility] Pager: [phone] Cell: [phone] If no response, call [phone]

## 2018-12-06 NOTE — Progress Notes (Addendum)
Spoken with Lelon Huh (daughter). Updates given. Liaised with E link so daughter can videocall pt. At bedside.

## 2018-12-06 NOTE — Progress Notes (Signed)
Nutrition Follow-up  DOCUMENTATION CODES:   Not applicable  INTERVENTION:   D/C Vital 1.5  Initiate Pivot 1.5 @ 35 ml/hr (840 ml/day) via Cortrak tube Continue 60 ml Prostat TID  Provides: 1860 kcal, 168 grams protein, and 637 ml free water. Total free water: 1437 ml   TF regimen and propofol at current rate providing 2440 total kcal/day    NUTRITION DIAGNOSIS:   Increased nutrient needs related to acute illness as evidenced by estimated needs.  Ongoing   GOAL:   Patient will meet greater than or equal to 90% of their needs  Progressing  MONITOR:   PO intake, Supplement acceptance  ASSESSMENT:   63 yo female admitted with N/V/D and SOB which began on 9/27 after attending a big wedding. COVID-19 positive in the ED. PMH includes HTN, cerebral artery aneurysm rupture.   Per RD previous notes pt with poor intake this admission.  Per CCM pt with worsening hypercarbia, levo requirements increased overnight, decadron stopped, pt with new pneumothorax and chest tube placed.    9/28 admitted 10/17 intubated 10/18 restarted remdesivir, decadron 10/21 cortrak placed (tip in stomach) 10/22 prone position resumed; chest tube placed   Patient is currently intubated on ventilator support MV: 14.4 L/min Temp (24hrs), Avg:97.9 F (36.6 C), Min:96.3 F (35.7 C), Max:99.1 F (37.3 C)  Propofol @ 22 ml/hr (40 mcg) provides: 580 kcal  Labs reviewed: Na 151 (H), K+ 5.5 (H), BUN: 119 (H) Medications reviewed: decadron, miralax Levo 2 12 mcg 200 ml free water every 6 hours = 800 ml    TF: Vital 1.5 @ 35 ml/hr via OG tube 60 ml Prostat TID Provides: 1860 kcal, 146 grams protein, and 641 ml free water.     Diet Order:   Diet Order    None      EDUCATION NEEDS:   Not appropriate for education at this time  Skin:  Skin Assessment: Reviewed RN Assessment  Last BM:  10/21 x 2  Height:   Ht Readings from Last 1 Encounters:  12/03/18 5\' 5"  (1.651 m)    Weight:    Wt Readings from Last 1 Encounters:  12/06/18 92.3 kg    Ideal Body Weight:  56.8 kg  BMI:  Body mass index is 33.86 kg/m.  Estimated Nutritional Needs:   Kcal:  2100-2400  Protein:  130-174 grams  Fluid:  >/= 1.8 L  Maylon Peppers RD, LDN, CNSC [phone] Pager [phone] After Hours Pager

## 2018-12-07 ENCOUNTER — Inpatient Hospital Stay (HOSPITAL_COMMUNITY): Payer: BC Managed Care – PPO

## 2018-12-07 LAB — POCT I-STAT 7, (LYTES, BLD GAS, ICA,H+H)
Acid-base deficit: 1 mmol/L (ref 0.0–2.0)
Bicarbonate: 28.6 mmol/L — ABNORMAL HIGH (ref 20.0–28.0)
Calcium, Ion: 1.26 mmol/L (ref 1.15–1.40)
Calcium, Ion: 1.15 mmol/L (ref 1.15–1.40)
HCT: 30 % — ABNORMAL LOW (ref 36.0–46.0)
HCT: 27 % — ABNORMAL LOW (ref 36.0–46.0)
Hemoglobin: 10.2 g/dL — ABNORMAL LOW (ref 12.0–15.0)
Hemoglobin: 9.2 g/dL — ABNORMAL LOW (ref 12.0–15.0)
O2 Saturation: 90 %
Potassium: 5.2 mmol/L — ABNORMAL HIGH (ref 3.5–5.1)
Potassium: 4.3 mmol/L (ref 3.5–5.1)
Sodium: 153 mmol/L — ABNORMAL HIGH (ref 135–145)
Sodium: 155 mmol/L — ABNORMAL HIGH (ref 135–145)
TCO2: 31 mmol/L (ref 22–32)
pCO2 arterial: >97 mmHg (ref 32.0–48.0)
pCO2 arterial: 81.3 mmHg (ref 32.0–48.0)
pH, Arterial: 7.142 — CL (ref 7.350–7.450)
pH, Arterial: 7.155 — CL (ref 7.350–7.450)
pO2, Arterial: 95 mmHg (ref 83.0–108.0)
pO2, Arterial: 77 mmHg — ABNORMAL LOW (ref 83.0–108.0)

## 2018-12-07 LAB — GLUCOSE, CAPILLARY
Glucose-Capillary: 180 mg/dL — ABNORMAL HIGH (ref 70–99)
Glucose-Capillary: 155 mg/dL — ABNORMAL HIGH (ref 70–99)
Glucose-Capillary: 143 mg/dL — ABNORMAL HIGH (ref 70–99)
Glucose-Capillary: 152 mg/dL — ABNORMAL HIGH (ref 70–99)
Glucose-Capillary: 121 mg/dL — ABNORMAL HIGH (ref 70–99)
Glucose-Capillary: 117 mg/dL — ABNORMAL HIGH (ref 70–99)
Glucose-Capillary: 150 mg/dL — ABNORMAL HIGH (ref 70–99)

## 2018-12-07 LAB — CBC
HCT: 39.1 % (ref 36.0–46.0)
Hemoglobin: 10.5 g/dL — ABNORMAL LOW (ref 12.0–15.0)
MCH: 27.7 pg (ref 26.0–34.0)
MCHC: 26.9 g/dL — ABNORMAL LOW (ref 30.0–36.0)
MCV: 103.2 fL — ABNORMAL HIGH (ref 80.0–100.0)
Platelets: 230 10*3/uL (ref 150–400)
RBC: 3.79 MIL/uL — ABNORMAL LOW (ref 3.87–5.11)
RDW: 15.9 % — ABNORMAL HIGH (ref 11.5–15.5)
WBC: 11.3 10*3/uL — ABNORMAL HIGH (ref 4.0–10.5)
nRBC: 0.2 % (ref 0.0–0.2)

## 2018-12-07 LAB — COMPREHENSIVE METABOLIC PANEL
ALT: 23 U/L (ref 0–44)
AST: 20 U/L (ref 15–41)
Albumin: 2 g/dL — ABNORMAL LOW (ref 3.5–5.0)
Alkaline Phosphatase: 71 U/L (ref 38–126)
Anion gap: 3 — ABNORMAL LOW (ref 5–15)
BUN: 96 mg/dL — ABNORMAL HIGH (ref 8–23)
CO2: 32 mmol/L (ref 22–32)
Calcium: 8.3 mg/dL — ABNORMAL LOW (ref 8.9–10.3)
Chloride: 117 mmol/L — ABNORMAL HIGH (ref 98–111)
Creatinine, Ser: 1.59 mg/dL — ABNORMAL HIGH (ref 0.44–1.00)
GFR calc Af Amer: 40 mL/min — ABNORMAL LOW (ref >60)
GFR calc non Af Amer: 34 mL/min — ABNORMAL LOW (ref >60)
Glucose, Bld: 201 mg/dL — ABNORMAL HIGH (ref 70–99)
Potassium: 5.7 mmol/L — ABNORMAL HIGH (ref 3.5–5.1)
Sodium: 152 mmol/L — ABNORMAL HIGH (ref 135–145)
Total Bilirubin: 0.3 mg/dL (ref 0.3–1.2)
Total Protein: 6.3 g/dL — ABNORMAL LOW (ref 6.5–8.1)

## 2018-12-07 LAB — TRIGLYCERIDES: Triglycerides: 186 mg/dL — ABNORMAL HIGH (ref <150)

## 2018-12-07 LAB — MAGNESIUM: Magnesium: 3 mg/dL — ABNORMAL HIGH (ref 1.7–2.4)

## 2018-12-07 LAB — HEPARIN LEVEL (UNFRACTIONATED): Heparin Unfractionated: 0.44 IU/mL (ref 0.30–0.70)

## 2018-12-07 LAB — LACTIC ACID, PLASMA: Lactic Acid, Venous: 0.4 mmol/L — ABNORMAL LOW (ref 0.5–1.9)

## 2018-12-07 MED ORDER — HEPARIN BOLUS VIA INFUSION
1000.0000 [IU] | Freq: Once | INTRAVENOUS | Status: AC
  Administered 2018-12-07: 17:00:00 1000 [IU] via INTRAVENOUS
  Filled 2018-12-07: qty 1000

## 2018-12-07 MED ORDER — HEPARIN (PORCINE) 25000 UT/250ML-% IV SOLN
1450.0000 [IU]/h | INTRAVENOUS | Status: DC
  Administered 2018-12-07: 17:00:00 1300 [IU]/h via INTRAVENOUS
  Filled 2018-12-07 (×3): qty 250

## 2018-12-07 NOTE — Progress Notes (Addendum)
[place] TEAM 1 - Stepdown/ICU TEAM  Irven Coenita P Housholder  [id number] DOB: Dec 24, 1955 DOA: 10/25/2018 PCP: Leeroy BockAnderson, Chelsey L, DO    Brief Narrative:  [phone] with a history of HTN and cerebral artery aneurysm rupture who presented to the ED 9/28 with 1 week of nausea vomiting and diarrhea followed by progressive shortness of breath with fever which began on 9/27.  She had attended a large wedding prior to the onset of her symptoms.  In the ED she was found to be hypoxic and confirmed to be SARS-CoV-2 positive.  Chest x-ray revealed bilateral infiltrates.  Significant Events: 9/21 approximate onset of GI symptoms 9/27 onset of pulmonary symptoms 9/28 admit  9/30 transfer to ICU 10/3 transfer back to PCU 10/16 TTE 10/17 transfer to ICU with respiratory distress/obtundation -intubation 10/22 small R pneumothorax -chest tube placed  COVID-19 specific Treatment: Remdesivir 9/28 >10/2 Solumedrol 9/28 > 10/7 Actemra 9/29 Convalescent plasma - patient declined Solumedrol 10/16 > 10/18 Remdesivir 10/18 > 10/22 Decadron 10/18 > 10/22  Subjective: Remains noncommunicative, sedated on ventilator.  Has been declining over the past 24 hours.  Refractory acidosis being encountered.  Assessment & Plan:  COVID pneumonia - persisting acute hypoxic respiratory failure /acute hypercarbic respiratory failure CT angio chest negative for PE 10/3 - bilateral lower extremity venous duplex negative for DVT - completed full courses of Solu-Medrol, Remdesivir, and Actemra treatment during initial portion of hospital stay -was given a repeat course of remdesivir and Decadron -ventilator management per PCCM with adjustments being made today to attempt to correct severe acidosis  Small right-sided pneumothorax Apparent on CXR 10/22 -chest tube placed per PCCM  Acute encephalopathy Patient was found in her room 10/17 acutely altered with severe hypoxia -though her hypoxia improved with increased oxygen support  her mental status did not and she required urgent intubation -an EEG revealed no evidence of seizure -CT head was unrevealing  HTN Blood pressure controlled  Acute kidney injury Creatinine peaked at 2.14 -continue to follow intermittently   Recent Labs  Lab 12/03/18 0509 12/04/18 0500 12/05/18 0500 12/06/18 0545 12/07/18 0408  CREATININE 1.99* 1.88* 1.69* 1.72* 1.59*    Uremia BUN markedly elevated -second course of steroid discontinued 10/22 -holding diuretic -gently expand free water and follow  Hypernatremia Continue free water via enteral route  Transaminitis resolved - likely due to the virus itself +/- Remdesivir  History of ruptured cerebral artery aneurysm  DVT prophylaxis: Lovenox Code Status: FULL CODE Family Communication: spoke w/ daughter and mother via phone 10/23 Disposition Plan: ICU  Consultants:  PCCM  Antimicrobials:  Ceftriaxone 9/28 > 10/1  Azithromycin 9/28 > 9/29  Objective: Blood pressure (!) 136/50, pulse 92, temperature (!) 97.5 F (36.4 C), temperature source Axillary, resp. rate (!) 35, height 5\' 5"  (1.651 m), weight 92.8 kg, SpO2 100 %.  Intake/Output Summary (Last 24 hours) at 12/07/2018 1708 Last data filed at 12/07/2018 1400 Gross per 24 hour  Intake 4692.13 ml  Output 2970 ml  Net 1722.13 ml   Filed Weights   12/05/18 0500 12/06/18 0500 12/07/18 0419  Weight: 91.8 kg 92.3 kg 92.8 kg    Examination: General: Sedated on vent -noncommunicative Lungs: Fine crackles diffusely -no wheezing -even air movement bilaterally Cardiovascular: RRR -no rub or gallop appreciated Abdomen: NT/ND, soft, BS+ Extremities: Trace bilateral lower extremity edema  CBC: Recent Labs  Lab 12/04/18 0500 12/05/18 0500  12/06/18 0545 12/07/18 0408 12/07/18 1238 12/07/18 1432  WBC 7.1 7.9  --  9.4 11.3*  --   --  NEUTROABS 5.8 6.0  --  6.7  --   --   --   HGB 10.3* 10.2*   < > 10.6* 10.5* 10.2* 9.2*  HCT 34.8* 35.9*   < > 37.8 39.1 30.0*  27.0*  MCV 94.6 96.8  --  100.0 103.2*  --   --   PLT 198 214  --  251 230  --   --    < > = values in this interval not displayed.   Basic Metabolic Panel: Recent Labs  Lab 12/03/18 0509  12/03/18 1800  12/04/18 0500 12/05/18 0500  12/06/18 0545 12/07/18 0408 12/07/18 1238 12/07/18 1432  NA 142   < >  --    < > 148* 151*   < > 151* 152* 153* 155*  K 4.5   < >  --    < > 4.8 5.3*   < > 5.5* 5.7* 5.2* 4.3  CL 103  --   --   --  109 111  --  114* 117*  --   --   CO2 29  --   --   --  31 31  --  31 32  --   --   GLUCOSE 147*  --   --   --  142* 116*  --  154* 201*  --   --   BUN 72*  --   --   --  88* 94*  --  119* 96*  --   --   CREATININE 1.99*  --   --   --  1.88* 1.69*  --  1.72* 1.59*  --   --   CALCIUM 8.6*  --   --   --  8.6* 8.6*  --  8.7* 8.3*  --   --   MG 3.2*  --  2.9*  --  3.4*  --   --   --  3.0*  --   --   PHOS 4.6  --  4.1  --  3.8  --   --   --   --   --   --    < > = values in this interval not displayed.   GFR: Estimated Creatinine Clearance: 40.8 mL/min (A) (by C-G formula based on SCr of 1.59 mg/dL (H)).  Liver Function Tests: Recent Labs  Lab 12/04/18 0500 12/05/18 0500 12/06/18 0545 12/07/18 0408  AST 22 20 15 20   ALT 36 30 24 23   ALKPHOS 64 60 72 71  BILITOT 0.4 0.4 0.1* 0.3  PROT 6.5 6.9 6.8 6.3*  ALBUMIN 2.3* 2.4* 2.4* 2.0*    HbA1C: Hemoglobin A1C  Date/Time Value Ref Range Status  05/15/2015 03:52 PM 5.5  Final  04/25/2014 04:43 PM 5.8  Final   Hgb A1c MFr Bld  Date/Time Value Ref Range Status  12/02/2018 08:38 AM 6.4 (H) 4.8 - 5.6 % Final    Comment:    (NOTE)         Prediabetes: 5.7 - 6.4         Diabetes: >6.4         Glycemic control for adults with diabetes: <7.0     Scheduled Meds: . chlorhexidine  15 mL Mouth/Throat BID  . Chlorhexidine Gluconate Cloth  6 each Topical Daily  . clonazePAM  2 mg Per Tube BID  . feeding supplement (PRO-STAT SUGAR FREE 64)  60 mL Per Tube TID  . free water  300 mL Per Tube Q6H  .  HYDROmorphone  2 mg Per Tube Q4H  . insulin aspart  0-15 Units Subcutaneous Q4H  . mouth rinse  15 mL Mouth Rinse 10 times per day  . pantoprazole sodium  40 mg Per Tube Daily  . polyethylene glycol  17 g Oral BID  . sodium chloride flush  10-40 mL Intracatheter Q12H     LOS: 25 days   Lonia Blood, MD [facility] Office  [phone] Pager - Text Page per Amion  If 7PM-7AM, please contact night-coverage per Amion 12/07/2018, 5:08 PM

## 2018-12-07 NOTE — Progress Notes (Signed)
Pt continuing to prone and having head repositioned with use of proning pillow.  Pt tolerating well.

## 2018-12-07 NOTE — Progress Notes (Signed)
Order for foley catheter insertion obtained as pt on prone position and has not urinated after straight cath at 1900. Patient on in and out cath for 24 hours now. Urine output post catheter insertion=750 mls.

## 2018-12-07 NOTE — Progress Notes (Signed)
NAME:  Shelley Allison, MRN:  [id number], DOB:  12/20/1955, LOS: 25 ADMISSION DATE:  10/19/2018, CONSULTATION DATE:  10/16 REFERRING MD:  [name], CHIEF COMPLAINT:  Dyspnea   Brief History   63 year old female admitted on November 12, 2018 in the setting of worsening hypoxemic respiratory failure from COVID-19 pneumonia.   Past Medical History  HTN Cerebral artery aneurysm rupture  Significant Hospital Events   09/28 admit, start decadron/remdesivir 09/29 given tocilizumab in setting of worsening hypoxia; pt deferred option for convalescent plasma 09/30 transfer to ICU, PCCM consulted 10/03 transfer back to PCU 10/16 PCCM reconsulted for presistent hypoxia; restart solumedrol  10/17 transferred back to ICU with AMS and hypoxia, required intubation 10/18 restart remdesivir 10/22 left pneumothorax, chest tube placed  Consults:  PCCM  Procedures:  ETT 10/17 >> Rt radial a line 10/18 >>   Significant Diagnostic Tests:  Doppler b/l legs 10/01 >> no DVT CT angio chest 10/03 >> borderline LAN up to 1.5 cm, b/l patchy consolidation and GGO Echo 10/16 >> EF 70 to 75%, PAS 50 mmHg, RV size and function normal CT head 10/17 >> s/p Lt frontotemporal craniotomy, encephalomalacia Lt temporal lobe, chronic small posterior temporal cortical infarct, Lt aneurysm clipping EEG 10/19 >> mild general background slowing  Micro Data:  SARS COV2 9/28 >> POSITIVE Sputum 10/18 >> Urine 10/18 >> negative  Antimicrobials/COVID Rx:  Remdesivir 9/28 >> 10/02 Solumedrol 9/28 >> 10/07 Tocilizumab 9/29  Solumedrol 10/16 >> 10/18 Remdesivir 10/18 >>  Decadron 10/18 >>   Interim history/subjective:   Respiratory acidosis persists Ventilator adjusted this morning  Objective   Blood pressure (!) 126/49, pulse 96, temperature (!) 97.5 F (36.4 C), temperature source Axillary, resp. rate (!) 35, height 5\' 5"  (1.651 m), weight 92.8 kg, SpO2 99 %. CVP:  [5 mmHg-13 mmHg] 12 mmHg  Vent Mode: PRVC  FiO2 (%):  [60 %] 60 % Set Rate:  [35 bmp] 35 bmp Vt Set:  [400 mL-450 mL] 450 mL PEEP:  [10 cmH20] 10 cmH20 Plateau Pressure:  [32 cmH20-38 cmH20] 32 cmH20   Intake/Output Summary (Last 24 hours) at 12/07/2018 1314 Last data filed at 12/07/2018 1200 Gross per 24 hour  Intake 4904.75 ml  Output 2795 ml  Net 2109.75 ml   Filed Weights   12/05/18 0500 12/06/18 0500 12/07/18 0419  Weight: 91.8 kg 92.3 kg 92.8 kg    Examination:  General:  In bed on vent, prone positioning HENT: NCAT ETT in place PULM: Crackles bases B, vent supported breathing CV: difficult to examine because she is in prone position GI: bowel sounds positive from lateral exam MSK: normal bulk and tone Neuro: sedated on vent   10/22 CXR >personally reviewed: chest tube in place, bilateral airspace disease, ETT in place  Resolved Hospital Problem list     Assessment & Plan:  Acute hypoxemic/hypercapnic respiratory failure in setting of COVID-19 pneumonia Concern for fibrotic change of lung post COVID Worsening respiratory acidosis, likely worsening dead space Continue mechanical ventilation per ARDS protocol Target TVol 6-8cc/kgIBW Target Plateau Pressure < 30cm H20 Target driving pressure less than 15 cm of water Target PaO2 55-65: titrate PEEP/FiO2 per protocol As long as PaO2 to FiO2 ratio is less than 1:150 position in prone position for 16 hours a day Check CVP daily if CVL in place Target CVP less than 4, diurese as necessary Ventilator associated pneumonia prevention protocol 10/23 plan: move back to supine position, repeat CXR now, bedside echo now, check lactic acid  Shock/hypotension: mild,  sedation related Levophed titrated to MAP > 65  Pneumothorax Chest tube to -20 cm suction Repeat CXR  Acute metabolic encephalopathy Severe ventilator dyssynchrony Continue dilaudid and clonazapem RASS target -3 Continue fentanyl/propofol per PAD Protocol  AKI: stable, non oliguric Monitor BMET  and UOP Replace electrolytes as needed   Best practice:  Diet: tube feeding Pain/Anxiety/Delirium protocol (if indicated): yes, RASS target -3 VAP protocol (if indicated): yes DVT prophylaxis: lovenox per protocol GI prophylaxis: pantoprazole Glucose control: per TRH Mobility: bed rest Code Status: full Family Communication: per [facility] Disposition: remain in ICU  Labs   CBC: Recent Labs  Lab 12/02/18 0830  12/03/18 0509  12/04/18 0500 12/05/18 0500 12/06/18 0428 12/06/18 0545 12/07/18 0408  WBC 11.3*  --  10.7*  --  7.1 7.9  --  9.4 11.3*  NEUTROABS 10.1*  --  9.3*  --  5.8 6.0  --  6.7  --   HGB 11.9*   < > 11.3*   < > 10.3* 10.2* 11.2* 10.6* 10.5*  HCT 39.2   < > 37.7   < > 34.8* 35.9* 33.0* 37.8 39.1  MCV 92.2  --  93.3  --  94.6 96.8  --  100.0 103.2*  PLT 231  --  229  --  198 214  --  251 230   < > = values in this interval not displayed.    Basic Metabolic Panel: Recent Labs  Lab 12/02/18 0150  12/02/18 1938 12/03/18 0509  12/03/18 1800  12/04/18 0500 12/05/18 0500 12/06/18 0428 12/06/18 0545 12/07/18 0408  NA 144   < >  --  142   < >  --    < > 148* 151* 151* 151* 152*  K 5.5*   < >  --  4.5   < >  --    < > 4.8 5.3* 5.4* 5.5* 5.7*  CL 103   < >  --  103  --   --   --  109 111  --  114* 117*  CO2 26   < >  --  29  --   --   --  31 31  --  31 32  GLUCOSE 106*   < >  --  147*  --   --   --  142* 116*  --  154* 201*  BUN 59*   < >  --  72*  --   --   --  88* 94*  --  119* 96*  CREATININE 2.05*   < >  --  1.99*  --   --   --  1.88* 1.69*  --  1.72* 1.59*  CALCIUM 9.1   < >  --  8.6*  --   --   --  8.6* 8.6*  --  8.7* 8.3*  MG 3.0*  --  3.1* 3.2*  --  2.9*  --  3.4*  --   --   --  3.0*  PHOS 8.0*  --  5.5* 4.6  --  4.1  --  3.8  --   --   --   --    < > = values in this interval not displayed.   GFR: Estimated Creatinine Clearance: 40.8 mL/min (A) (by C-G formula based on SCr of 1.59 mg/dL (H)). Recent Labs  Lab 12/01/18 2000 12/01/18 2305  12/02/18  0830 12/03/18 0509 12/04/18 0500 12/05/18 0500 12/06/18 0545 12/07/18 0408  PROCALCITON 0.23  --   --   --  0.42 0.15  --   --   --   WBC 15.3*  --    < > 11.3* 10.7* 7.1 7.9 9.4 11.3*  LATICACIDVEN 1.3 1.6  --  1.5  --   --   --   --   --    < > = values in this interval not displayed.    Liver Function Tests: Recent Labs  Lab 12/03/18 0509 12/04/18 0500 12/05/18 0500 12/06/18 0545 12/07/18 0408  AST 19 22 20 15 20   ALT 40 36 30 24 23   ALKPHOS 71 64 60 72 71  BILITOT 0.3 0.4 0.4 0.1* 0.3  PROT 6.8 6.5 6.9 6.8 6.3*  ALBUMIN 2.6* 2.3* 2.4* 2.4* 2.0*   No results for input(s): LIPASE, AMYLASE in the last 168 hours. No results for input(s): AMMONIA in the last 168 hours.  ABG    Component Value Date/Time   PHART 7.220 (L) 12/06/2018 0428   PCO2ART 76.8 (HH) 12/06/2018 0428   PO2ART 51.0 (L) 12/06/2018 0428   HCO3 31.5 (H) 12/06/2018 0428   TCO2 34 (H) 12/06/2018 0428   O2SAT 77.0 12/06/2018 0428     Coagulation Profile: No results for input(s): INR, PROTIME in the last 168 hours.  Cardiac Enzymes: No results for input(s): CKTOTAL, CKMB, CKMBINDEX, TROPONINI in the last 168 hours.  HbA1C: Hemoglobin A1C  Date/Time Value Ref Range Status  05/15/2015 03:52 PM 5.5  Final  04/25/2014 04:43 PM 5.8  Final   Hgb A1c MFr Bld  Date/Time Value Ref Range Status  12/02/2018 08:38 AM 6.4 (H) 4.8 - 5.6 % Final    Comment:    (NOTE)         Prediabetes: 5.7 - 6.4         Diabetes: >6.4         Glycemic control for adults with diabetes: <7.0     CBG: Recent Labs  Lab 12/06/18 2347 12/07/18 0325 12/07/18 0417 12/07/18 0905 12/07/18 1153  GLUCAP 155* 180* 155* 143* 152*     Critical care time: 35 minutes       Roselie Awkward, MD [facility] Pager: [phone] Cell: [phone] If no response, call [phone]

## 2018-12-07 NOTE — Progress Notes (Signed)
Pt head repositioned to the right with no complications. ET remains at 24 and secure with cloth tape. RT will continue to monitor

## 2018-12-07 NOTE — Progress Notes (Signed)
Repeat ABG 7.14/>97/95, the rest unreadable due to high CO2. Results given to CCM MD.

## 2018-12-07 NOTE — Progress Notes (Addendum)
Pt head repositioned to the left with no complications. ET remains at 24 and secure with cloth tape. RT will continue to monitor.

## 2018-12-07 NOTE — Progress Notes (Signed)
Pt head repositioned at [place]. Pt tolerated well.  No skin issues noted at time of repositioning.

## 2018-12-07 NOTE — Progress Notes (Addendum)
[place] for Heparin Indication: suspected PE  Allergies  Allergen Reactions  . Fish Allergy Hives and Shortness Of Breath    All fish and fish derivatives  . Other Hives, Shortness Of Breath and Rash    All Pitted Fruit  . Peanuts [Peanut Oil]   . Shellfish Allergy     Patient Measurements: Height: 5\' 5"  (165.1 cm) Weight: 204 lb 9.4 oz (92.8 kg) IBW/kg (Calculated) : 57 Heparin Dosing Weight: 80kg  Vital Signs: Temp: 97.5 F (36.4 C) (10/23 1200) Temp Source: Axillary (10/23 1200) BP: 136/50 (10/23 1521) Pulse Rate: 92 (10/23 1521)  Labs: Recent Labs    12/05/18 0500  12/06/18 0545 12/07/18 0408 12/07/18 1238 12/07/18 1432  HGB 10.2*   < > 10.6* 10.5* 10.2* 9.2*  HCT 35.9*   < > 37.8 39.1 30.0* 27.0*  PLT 214  --  251 230  --   --   CREATININE 1.69*  --  1.72* 1.59*  --   --    < > = values in this interval not displayed.    Estimated Creatinine Clearance: 40.8 mL/min (A) (by C-G formula based on SCr of 1.59 mg/dL (H)).   Medications:  Scheduled:  . chlorhexidine  15 mL Mouth/Throat BID  . Chlorhexidine Gluconate Cloth  6 each Topical Daily  . clonazePAM  2 mg Per Tube BID  . enoxaparin (LOVENOX) injection  40 mg Subcutaneous Q12H  . feeding supplement (PRO-STAT SUGAR FREE 64)  60 mL Per Tube TID  . free water  300 mL Per Tube Q6H  . HYDROmorphone  2 mg Per Tube Q4H  . insulin aspart  0-15 Units Subcutaneous Q4H  . mouth rinse  15 mL Mouth Rinse 10 times per day  . pantoprazole sodium  40 mg Per Tube Daily  . polyethylene glycol  17 g Oral BID  . sodium chloride flush  10-40 mL Intracatheter Q12H   Infusions:  . sodium chloride Stopped (12/03/18 1459)  . feeding supplement (PIVOT 1.5 CAL) 1,000 mL (12/06/18 1622)  . fentaNYL infusion INTRAVENOUS 200 mcg/hr (12/07/18 0700)  . norepinephrine (LEVOPHED) Adult infusion 3 mcg/min (12/07/18 1547)  . propofol (DIPRIVAN) infusion 30 mcg/kg/min (12/07/18 1202)     Assessment: 27 yoF admitted on 9/28 for COVID-19 pneumonia.  She has been on prophylactic enoxaparin, CT angio chest negative for PE 10/3 and bilateral lower extremity venous duplex negative for DVT.  Pharmacy is consulted to increase to treatment dose enoxaparin on 10/23 for suspected PE.  CrCl > 30, Hgb remains stable ~10.5, Plt WNL.  No bleeding or complications reported. D-dimer 2.12 Most recent Lovenox dose, 40mg  [place] given on 10/23 at ~09:00.  Goal of Therapy:  Heparin level 0.3-0.7 units/ml Monitor platelets by anticoagulation protocol: Yes   Plan:  Give heparin 1000 units bolus IV x 1 Start heparin IV infusion at 1300 units/hr Heparin level 6 hours after starting Daily heparin level and CBC Continue for s/s bleeding thrombosis.   Gretta Arab PharmD, BCPS Clinical pharmacist phone 7am- 5pm: [phone] 12/07/2018 4:02 PM

## 2018-12-07 NOTE — Progress Notes (Signed)
Pt unproned at this time per MD request. Pt stable throughout with no complications. VS within normal limits. Tape was replaced with commercial tube holder and et remains secure at 24 at the lip. Edematous tongue noted and documented. MD aware. RT will continue to monitor

## 2018-12-07 NOTE — Progress Notes (Signed)
S/w dtr Lelon Huh ~0750 and updated her on positioning, gtts, comfort, tube feeds, F/c, chest tube.  Suggested call back after 1430 (to supinate ~1330) for another update.  Rotated pt's head ~0745, 1015, 1230 Pt supinated 1320  S/w dtr ~1645; gave update.  Dtr stated wants to video chat ~2130.  Will alert oncoming RN.    Titrated all drips to lower rates.  Heparin gtt added in afternoon.

## 2018-12-07 NOTE — Progress Notes (Addendum)
Pt head repositioned to the right with no complications. ET remains at 24 and secure with cloth tape. RT will continue to monitor 

## 2018-12-07 NOTE — Progress Notes (Signed)
Patient daughter, Lelon Huh, updated via phone at 2116. Patient daughter initally wanted to facetime with patient but declined due to personal health. Patient daughter updated on patient status and plan of care. Patient daughter stated she is struggling with everything going on and not feeling well herself. Emotional support given to patient daughter. No further questions at present time. Patient daughter stated she hopes she will feel better tomorrow so that she will feel like facetiming with patient.

## 2018-12-07 NOTE — Progress Notes (Signed)
Critical ABG results hand delivered to Dr [facility] pH 7.146/Co2 >97/O2 77, the rest was unreadable due to CO2. Increased VT from 7 cc to 8cc/kg will repeat ABG in 1 hour.

## 2018-12-08 ENCOUNTER — Inpatient Hospital Stay (HOSPITAL_COMMUNITY): Payer: BC Managed Care – PPO

## 2018-12-08 DIAGNOSIS — R0600 Dyspnea, unspecified: Secondary | ICD-10-CM

## 2018-12-08 LAB — POCT I-STAT 7, (LYTES, BLD GAS, ICA,H+H)
Acid-Base Excess: 5 mmol/L — ABNORMAL HIGH (ref 0.0–2.0)
Acid-Base Excess: 2 mmol/L (ref 0.0–2.0)
Acid-Base Excess: 4 mmol/L — ABNORMAL HIGH (ref 0.0–2.0)
Bicarbonate: 34.8 mmol/L — ABNORMAL HIGH (ref 20.0–28.0)
Bicarbonate: 33.2 mmol/L — ABNORMAL HIGH (ref 20.0–28.0)
Bicarbonate: 33.2 mmol/L — ABNORMAL HIGH (ref 20.0–28.0)
Calcium, Ion: 1.27 mmol/L (ref 1.15–1.40)
Calcium, Ion: 1.23 mmol/L (ref 1.15–1.40)
Calcium, Ion: 1.24 mmol/L (ref 1.15–1.40)
HCT: 30 % — ABNORMAL LOW (ref 36.0–46.0)
HCT: 32 % — ABNORMAL LOW (ref 36.0–46.0)
HCT: 28 % — ABNORMAL LOW (ref 36.0–46.0)
Hemoglobin: 10.2 g/dL — ABNORMAL LOW (ref 12.0–15.0)
Hemoglobin: 10.9 g/dL — ABNORMAL LOW (ref 12.0–15.0)
Hemoglobin: 9.5 g/dL — ABNORMAL LOW (ref 12.0–15.0)
O2 Saturation: 79 %
O2 Saturation: 74 %
O2 Saturation: 81 %
Patient temperature: 101.5
Patient temperature: 99.9
Patient temperature: 100.7
Potassium: 5.2 mmol/L — ABNORMAL HIGH (ref 3.5–5.1)
Potassium: 4.9 mmol/L (ref 3.5–5.1)
Potassium: 4.9 mmol/L (ref 3.5–5.1)
Sodium: 153 mmol/L — ABNORMAL HIGH (ref 135–145)
Sodium: 153 mmol/L — ABNORMAL HIGH (ref 135–145)
Sodium: 154 mmol/L — ABNORMAL HIGH (ref 135–145)
TCO2: 37 mmol/L — ABNORMAL HIGH (ref 22–32)
TCO2: 36 mmol/L — ABNORMAL HIGH (ref 22–32)
TCO2: 36 mmol/L — ABNORMAL HIGH (ref 22–32)
pCO2 arterial: 90.8 mmHg (ref 32.0–48.0)
pCO2 arterial: >97 mmHg (ref 32.0–48.0)
pCO2 arterial: 80.3 mmHg (ref 32.0–48.0)
pH, Arterial: 7.2 — ABNORMAL LOW (ref 7.350–7.450)
pH, Arterial: 7.137 — CL (ref 7.350–7.450)
pH, Arterial: 7.231 — ABNORMAL LOW (ref 7.350–7.450)
pO2, Arterial: 61 mmHg — ABNORMAL LOW (ref 83.0–108.0)
pO2, Arterial: 56 mmHg — ABNORMAL LOW (ref 83.0–108.0)
pO2, Arterial: 60 mmHg — ABNORMAL LOW (ref 83.0–108.0)

## 2018-12-08 LAB — HEPARIN LEVEL (UNFRACTIONATED)
Heparin Unfractionated: 0.28 IU/mL — ABNORMAL LOW (ref 0.30–0.70)
Heparin Unfractionated: 0.39 IU/mL (ref 0.30–0.70)

## 2018-12-08 LAB — CBC
HCT: 36 % (ref 36.0–46.0)
Hemoglobin: 9.8 g/dL — ABNORMAL LOW (ref 12.0–15.0)
MCH: 27.7 pg (ref 26.0–34.0)
MCHC: 27.2 g/dL — ABNORMAL LOW (ref 30.0–36.0)
MCV: 101.7 fL — ABNORMAL HIGH (ref 80.0–100.0)
Platelets: 200 10*3/uL (ref 150–400)
RBC: 3.54 MIL/uL — ABNORMAL LOW (ref 3.87–5.11)
RDW: 15.8 % — ABNORMAL HIGH (ref 11.5–15.5)
WBC: 12.3 10*3/uL — ABNORMAL HIGH (ref 4.0–10.5)
nRBC: 0.2 % (ref 0.0–0.2)

## 2018-12-08 LAB — TRIGLYCERIDES: Triglycerides: 76 mg/dL (ref <150)

## 2018-12-08 LAB — COMPREHENSIVE METABOLIC PANEL
ALT: 21 U/L (ref 0–44)
AST: 20 U/L (ref 15–41)
Albumin: 1.8 g/dL — ABNORMAL LOW (ref 3.5–5.0)
Alkaline Phosphatase: 63 U/L (ref 38–126)
Anion gap: 5 (ref 5–15)
BUN: 93 mg/dL — ABNORMAL HIGH (ref 8–23)
CO2: 29 mmol/L (ref 22–32)
Calcium: 8.1 mg/dL — ABNORMAL LOW (ref 8.9–10.3)
Chloride: 119 mmol/L — ABNORMAL HIGH (ref 98–111)
Creatinine, Ser: 1.43 mg/dL — ABNORMAL HIGH (ref 0.44–1.00)
GFR calc Af Amer: 45 mL/min — ABNORMAL LOW (ref >60)
GFR calc non Af Amer: 39 mL/min — ABNORMAL LOW (ref >60)
Glucose, Bld: 135 mg/dL — ABNORMAL HIGH (ref 70–99)
Potassium: 5.6 mmol/L — ABNORMAL HIGH (ref 3.5–5.1)
Sodium: 153 mmol/L — ABNORMAL HIGH (ref 135–145)
Total Bilirubin: 0.2 mg/dL — ABNORMAL LOW (ref 0.3–1.2)
Total Protein: 6.1 g/dL — ABNORMAL LOW (ref 6.5–8.1)

## 2018-12-08 LAB — D-DIMER, QUANTITATIVE: D-Dimer, Quant: 2.59 ug/mL-FEU — ABNORMAL HIGH (ref 0.00–0.50)

## 2018-12-08 LAB — GLUCOSE, CAPILLARY
Glucose-Capillary: 117 mg/dL — ABNORMAL HIGH (ref 70–99)
Glucose-Capillary: 109 mg/dL — ABNORMAL HIGH (ref 70–99)
Glucose-Capillary: 123 mg/dL — ABNORMAL HIGH (ref 70–99)
Glucose-Capillary: 133 mg/dL — ABNORMAL HIGH (ref 70–99)

## 2018-12-08 LAB — ECHOCARDIOGRAM LIMITED
Height: 65 in
Weight: 3393.32 oz

## 2018-12-08 LAB — C-REACTIVE PROTEIN: CRP: 23.6 mg/dL — ABNORMAL HIGH (ref <1.0)

## 2018-12-08 LAB — FERRITIN: Ferritin: 773 ng/mL — ABNORMAL HIGH (ref 11–307)

## 2018-12-08 MED ORDER — SODIUM ZIRCONIUM CYCLOSILICATE 10 G PO PACK
10.0000 g | PACK | Freq: Three times a day (TID) | ORAL | Status: DC
  Administered 2018-12-08 – 2018-12-09 (×4): 10 g via ORAL
  Filled 2018-12-08 (×5): qty 1

## 2018-12-08 MED ORDER — FREE WATER
300.0000 mL | Status: DC
  Administered 2018-12-08 – 2018-12-09 (×6): 300 mL

## 2018-12-08 MED ORDER — ENOXAPARIN SODIUM 60 MG/0.6ML ~~LOC~~ SOLN
0.5000 mg/kg | SUBCUTANEOUS | Status: DC
  Administered 2018-12-08: 50 mg via SUBCUTANEOUS
  Filled 2018-12-08: qty 0.6

## 2018-12-08 NOTE — Progress Notes (Signed)
Sputum sample sent to lab per order. 

## 2018-12-08 NOTE — Progress Notes (Signed)
Per CCM MD flushed ETT with 10cc normal saline line with moderate return of secretions.

## 2018-12-08 NOTE — Progress Notes (Signed)
NAME:  Shelley Allison, MRN:  [id number], DOB:  [birth year], LOS: 35 ADMISSION DATE:  12/10/2018, CONSULTATION DATE:  10/16 REFERRING MD:  Cruzita Lederer, CHIEF COMPLAINT:  Dyspnea   Brief History   64 year old female admitted on Dec 10, 2018 in the setting of worsening hypoxemic respiratory failure from COVID-19 pneumonia.   Past Medical History  HTN Cerebral artery aneurysm rupture  Significant Hospital Events   09/28 admit, start decadron/remdesivir 09/29 given tocilizumab in setting of worsening hypoxia; pt deferred option for convalescent plasma 09/30 transfer to ICU, PCCM consulted 10/03 transfer back to PCU 10/16 PCCM reconsulted for presistent hypoxia; restart solumedrol  10/17 transferred back to ICU with AMS and hypoxia, required intubation 10/18 restart remdesivir 10/22 left pneumothorax, chest tube placed  Consults:  PCCM  Procedures:  ETT 10/17 >> Rt radial a line 10/18 >>   Significant Diagnostic Tests:  Doppler b/l legs 10/01 >> no DVT CT angio chest 10/03 >> borderline LAN up to 1.5 cm, b/l patchy consolidation and GGO Echo 10/16 >> EF 70 to 75%, PAS 50 mmHg, RV size and function normal CT head 10/17 >> s/p Lt frontotemporal craniotomy, encephalomalacia Lt temporal lobe, chronic small posterior temporal cortical infarct, Lt aneurysm clipping EEG 10/19 >> mild general background slowing 10/24 bedside echo> dilated RV, LV grossly normal  Micro Data:  SARS COV2 9/28 >> POSITIVE Sputum 10/18 >> Urine 10/18 >> negative  Antimicrobials/COVID Rx:  Remdesivir 9/28 >> 10/02 Solumedrol 9/28 >> 10/07 Tocilizumab 9/29  Solumedrol 10/16 >> 10/18 Remdesivir 10/18 >>  Decadron 10/18 >>   Interim history/subjective:   Elevated peak pressure Elevated plateau pressure Some drainage from chest tube site  Objective   Blood pressure (!) 112/56, pulse 99, temperature 99.1 F (37.3 C), temperature source Oral, resp. rate (!) 33, height 5\' 5"  (1.651 m), weight 96.2  kg, SpO2 99 %. CVP:  [6 mmHg-24 mmHg] 9 mmHg  Vent Mode: PRVC FiO2 (%):  [60 %-70 %] 70 % Set Rate:  [35 bmp] 35 bmp Vt Set:  [450 mL] 450 mL PEEP:  [10 cmH20] 10 cmH20 Plateau Pressure:  [32 cmH20-40 cmH20] 34 cmH20   Intake/Output Summary (Last 24 hours) at 12/08/2018 0759 Last data filed at 12/08/2018 0700 Gross per 24 hour  Intake 2661.77 ml  Output 2875 ml  Net -213.23 ml   Filed Weights   12/06/18 0500 12/07/18 0419 12/08/18 0403  Weight: 92.3 kg 92.8 kg 96.2 kg    Examination:  General:  In bed on vent HENT: NCAT ETT in place PULM: CTA B, vent supported breathing, chest tube in place CV: RRR, no mgr GI: BS+, soft, nontender MSK: normal bulk and tone Neuro: sedated on vent   10/22 CXR >personally reviewed: chest tube in place, bilateral airspace disease, ETT in place  Resolved Hospital Problem list     Assessment & Plan:  Acute hypoxemic/hypercapnic respiratory failure in setting of COVID-19 pneumonia Concern for fibrotic change of lung post COVID Worsening respiratory acidosis, likely worsening dead space Worsening lung compliance, peak and plateau elevated 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 RV appeared dilated on RV Obtain formal echocardiogram Continue empiric heparin infusion for possible PE Consider CT angiogram pending formal echo findings  Pneumothorax Chest tube to water seal  Acute metabolic  encephalopathy Severe ventilator dyssynchrony Dilaudid and clonazepam to continue RASS target -3 Fentanyl/propofol per PAD protocol  AKI: improving, with hypernatremia Monitor BMET and UOP Replace electrolytes as needed Adjust free water   Best practice:  Diet: tube feeding Pain/Anxiety/Delirium protocol (if indicated): yes, RASS target -3 VAP protocol (if indicated): yes DVT prophylaxis: lovenox per protocol GI prophylaxis: pantoprazole Glucose control: per TRH Mobility: bed rest Code Status: full Family [facility] Dis[facility]: remain in ICU  Labs   CBC: Recent Labs  Lab 12/02/18 0830  12/03/18 0509  12/04/18 0500 12/05/18 0500  12/06/18 0545 12/07/18 0408 12/07/18 1238 12/07/18 1432 12/08/18 0426 12/08/18 0430  WBC 11.3*  --  10.7*  --  7.1 7.9  --  9.4 11.3*  --   --   --  12.3*  NEUTROABS 10.1*  --  9.3*  --  5.8 6.0  --  6.7  --   --   --   --   --   HGB 11.9*   < > 11.3*   < > 10.3* 10.2*   < > 10.6* 10.5* 10.2* 9.2* 10.2* 9.8*  HCT 39.2   < > 37.7   < > 34.8* 35.9*   < > 37.8 39.1 30.0* 27.0* 30.0* 36.0  MCV 92.2  --  93.3  --  94.6 96.8  --  100.0 103.2*  --   --   --  101.7*  PLT 231  --  229  --  198 214  --  251 230  --   --   --  200   < > = values in this interval not displayed.    Basic Metabolic Panel: Recent Labs  Lab 12/02/18 0150  12/02/18 1938 12/03/18 0509  12/03/18 1800  12/04/18 0500 12/05/18 0500  12/06/18 0545 12/07/18 0408 12/07/18 1238 12/07/18 1432 12/08/18 0426 12/08/18 0430  NA 144   < >  --  142   < >  --    < > 148* 151*   < > 151* 152* 153* 155* 153* 153*  K 5.5*   < >  --  4.5   < >  --    < > 4.8 5.3*   < > 5.5* 5.7* 5.2* 4.3 5.2* 5.6*  CL 103   < >  --  103  --   --   --  109 111  --  114* 117*  --   --   --  119*  CO2 26   < >  --  29  --   --   --  31 31  --  31 32  --   --   --  29  GLUCOSE 106*   < >  --  147*  --   --   --  142* 116*  --  154* 201*  --   --   --  135*  BUN 59*   < >  --  72*  --    --   --  88* 94*  --  119* 96*  --   --   --  93*  CREATININE 2.05*   < >  --  1.99*  --   --   --  1.88* 1.69*  --  1.72* 1.59*  --   --   --  1.43*  CALCIUM 9.1   < >  --  8.6*  --   --   --  8.6* 8.6*  --  8.7* 8.3*  --   --   --  8.1*  MG 3.0*  --  3.1* 3.2*  --  2.9*  --  3.4*  --   --   --  3.0*  --   --   --   --   PHOS 8.0*  --  5.5* 4.6  --  4.1  --  3.8  --   --   --   --   --   --   --   --    < > = values in this interval not displayed.   GFR: Estimated Creatinine Clearance: 46.2 mL/min (A) (by C-G formula based on SCr of 1.43 mg/dL (H)). Recent Labs  Lab 12/01/18 2000 12/01/18 2305  12/02/18 0830 12/03/18 0509 12/04/18 0500 12/05/18 0500 12/06/18 0545 12/07/18 0408 12/07/18 1400 12/08/18 0430  PROCALCITON 0.23  --   --   --  0.42 0.15  --   --   --   --   --   WBC 15.3*  --    < > 11.3* 10.7* 7.1 7.9 9.4 11.3*  --  12.3*  LATICACIDVEN 1.3 1.6  --  1.5  --   --   --   --   --  0.4*  --    < > = values in this interval not displayed.    Liver Function Tests: Recent Labs  Lab 12/04/18 0500 12/05/18 0500 12/06/18 0545 12/07/18 0408 12/08/18 0430  AST 22 20 15 20 20   ALT 36 30 24 23 21   ALKPHOS 64 60 72 71 63  BILITOT 0.4 0.4 0.1* 0.3 0.2*  PROT 6.5 6.9 6.8 6.3* 6.1*  ALBUMIN 2.3* 2.4* 2.4* 2.0* 1.8*   No results for input(s): LIPASE, AMYLASE in the last 168 hours. No results for input(s): AMMONIA in the last 168 hours.  ABG    Component Value Date/Time   PHART 7.200 (L) 12/08/2018 0426   PCO2ART 90.8 (HH) 12/08/2018 0426   PO2ART 61.0 (L) 12/08/2018 0426   HCO3 34.8 (H) 12/08/2018 0426   TCO2 37 (H) 12/08/2018 0426   ACIDBASEDEF 1.0 12/07/2018 1432   O2SAT 79.0 12/08/2018 0426     Coagulation Profile: No results for input(s): INR, PROTIME in the last 168 hours.  Cardiac Enzymes: No results for input(s): CKTOTAL, CKMB, CKMBINDEX, TROPONINI in the last 168 hours.  HbA1C: Hemoglobin A1C  Date/Time Value Ref Range Status  05/15/2015 03:52 PM 5.5   Final  04/25/2014 04:43 PM 5.8  Final   Hgb A1c MFr Bld  Date/Time Value Ref Range Status  12/02/2018 08:38 AM 6.4 (H) 4.8 - 5.6 % Final    Comment:    (NOTE)         Prediabetes: 5.7 - 6.4         Diabetes: >6.4         Glycemic control for adults with diabetes: <7.0     CBG: Recent Labs  Lab 12/07/18 1704 12/07/18 1939 12/07/18 2307 12/08/18 0300 12/08/18 0735  GLUCAP 121* 117* 150* 117* 109*     Critical care time: 35 minutes       12/10/18, MD [place] PCCM Pager: [phone] Cell: [phone] If no response, call [phone]

## 2018-12-08 NOTE — Progress Notes (Signed)
[place] TEAM 1 - Stepdown/ICU TEAM  Shelley Allison  BJY:[id number]:[id number] DOB: [birth year] DOA: 10/22/2018 PCP: Leeroy BockAnderson, Chelsey L, DO    Brief Narrative:  [phone] with a history of HTN and cerebral artery aneurysm rupture who presented to the ED 9/28 with 1 week of nausea vomiting and diarrhea followed by progressive shortness of breath with fever which began on 9/27.  She had attended a large wedding prior to the onset of her symptoms.  In the ED she was found to be hypoxic and confirmed to be SARS-CoV-2 positive.  Chest x-ray revealed bilateral infiltrates.  Significant Events: 9/21 approximate onset of GI symptoms 9/27 onset of pulmonary symptoms 9/28 admit  9/30 transfer to ICU 10/3 transfer back to PCU 10/16 TTE 10/17 transfer to ICU with respiratory distress/obtundation -intubation 10/22 small R pneumothorax -chest tube placed  COVID-19 specific Treatment: Remdesivir 9/28 >10/2 Solumedrol 9/28 > 10/7 Actemra 9/29 Convalescent plasma - patient declined Solumedrol 10/16 > 10/18 Remdesivir 10/18 > 10/22 Decadron 10/18 > 10/22  Subjective: Persisting acidemia is proving difficult to correct.  Ventilator management being adjusted by PCCM.  The patient remains sedate.  No evidence of uncontrolled pain or anxiety.  Assessment & Plan:  COVID pneumonia - persisting acute hypoxic respiratory failure /acute hypercarbic respiratory failure - ?post-COVID lung fibrosis  CT angio chest negative for PE 10/3 - bilateral lower extremity venous duplex negative for DVT - completed full courses of Solu-Medrol, Remdesivir, and Actemra treatment during initial portion of hospital stay -was given a repeat course of remdesivir and Decadron -ventilator management per PCCM   Small right-sided pneumothorax Apparent on CXR 10/22 -chest tube care per PCCM -stable follow-up CXR today  Acute encephalopathy Patient was found in her room 10/17 acutely altered with severe hypoxia -though her hypoxia improved  with increased oxygen support her mental status did not and she required urgent intubation -an EEG revealed no evidence of seizure -CT head was unrevealing -presently sedated on ventilator  HTN Allison pressure controlled  Acute kidney injury Creatinine peaked at 2.14 -renal function is stable  Recent Labs  Lab 12/04/18 0500 12/05/18 0500 12/06/18 0545 12/07/18 0408 12/08/18 0430  CREATININE 1.88* 1.69* 1.72* 1.59* 1.43*    Uremia BUN markedly elevated -second course of steroid discontinued 10/22 -holding diuretic -gently expanding free water -continue to monitor  Recent Labs  Lab 12/04/18 0500 12/05/18 0500 12/06/18 0545 12/07/18 0408 12/08/18 0430  BUN 88* 94* 119* 96* 93*    Hypernatremia Continue free water via enteral route -increase dose today  Transaminitis resolved - likely due to the virus itself +/- Remdesivir  History of ruptured cerebral artery aneurysm  DVT prophylaxis: Lovenox Code Status: FULL CODE Family Communication: spoke w/ daughter and mother via phone 10/23 Disposition Plan: ICU  Consultants:  PCCM  Antimicrobials:  Ceftriaxone 9/28 > 10/1  Azithromycin 9/28 > 9/29  Objective: Allison pressure (!) 138/48, pulse 96, temperature 99.1 F (37.3 C), temperature source Oral, resp. rate (!) 37, height 5\' 5"  (1.651 m), weight 96.2 kg, SpO2 99 %.  Intake/Output Summary (Last 24 hours) at 12/08/2018 0823 Last data filed at 12/08/2018 0700 Gross per 24 hour  Intake 2524.07 ml  Output 2675 ml  Net -150.93 ml   Filed Weights   12/06/18 0500 12/07/18 0419 12/08/18 0403  Weight: 92.3 kg 92.8 kg 96.2 kg    Examination: General: Sedated on vent  Lungs: Fine crackles diffusely w/o wheezing Cardiovascular: RRR Abdomen: NT/ND, soft, BS+ Extremities: Trace bilateral lower extremity edema without change  CBC: Recent Labs  Lab 12/04/18 0500 12/05/18 0500  12/06/18 0545 12/07/18 0408  12/07/18 1432 12/08/18 0426 12/08/18 0430  WBC 7.1 7.9   --  9.4 11.3*  --   --   --  12.3*  NEUTROABS 5.8 6.0  --  6.7  --   --   --   --   --   HGB 10.3* 10.2*   < > 10.6* 10.5*   < > 9.2* 10.2* 9.8*  HCT 34.8* 35.9*   < > 37.8 39.1   < > 27.0* 30.0* 36.0  MCV 94.6 96.8  --  100.0 103.2*  --   --   --  101.7*  PLT 198 214  --  251 230  --   --   --  200   < > = values in this interval not displayed.   Basic Metabolic Panel: Recent Labs  Lab 12/03/18 0509  12/03/18 1800  12/04/18 0500  12/06/18 0545 12/07/18 0408  12/07/18 1432 12/08/18 0426 12/08/18 0430  NA 142   < >  --    < > 148*   < > 151* 152*   < > 155* 153* 153*  K 4.5   < >  --    < > 4.8   < > 5.5* 5.7*   < > 4.3 5.2* 5.6*  CL 103  --   --   --  109   < > 114* 117*  --   --   --  119*  CO2 29  --   --   --  31   < > 31 32  --   --   --  29  GLUCOSE 147*  --   --   --  142*   < > 154* 201*  --   --   --  135*  BUN 72*  --   --   --  88*   < > 119* 96*  --   --   --  93*  CREATININE 1.99*  --   --   --  1.88*   < > 1.72* 1.59*  --   --   --  1.43*  CALCIUM 8.6*  --   --   --  8.6*   < > 8.7* 8.3*  --   --   --  8.1*  MG 3.2*  --  2.9*  --  3.4*  --   --  3.0*  --   --   --   --   PHOS 4.6  --  4.1  --  3.8  --   --   --   --   --   --   --    < > = values in this interval not displayed.   GFR: Estimated Creatinine Clearance: 46.2 mL/min (A) (by C-G formula based on SCr of 1.43 mg/dL (H)).  Liver Function Tests: Recent Labs  Lab 12/05/18 0500 12/06/18 0545 12/07/18 0408 12/08/18 0430  AST 20 15 20 20   ALT 30 24 23 21   ALKPHOS 60 72 71 63  BILITOT 0.4 0.1* 0.3 0.2*  PROT 6.9 6.8 6.3* 6.1*  ALBUMIN 2.4* 2.4* 2.0* 1.8*    HbA1C: Hemoglobin A1C  Date/Time Value Ref Range Status  05/15/2015 03:52 PM 5.5  Final  04/25/2014 04:43 PM 5.8  Final   Hgb A1c MFr Bld  Date/Time Value Ref Range Status  12/02/2018 08:38 AM 6.4 (H) 4.8 - 5.6 % Final  Comment:    (NOTE)         Prediabetes: 5.7 - 6.4         Diabetes: >6.4         Glycemic control for adults with  diabetes: <7.0     Scheduled Meds: . chlorhexidine  15 mL Mouth/Throat BID  . Chlorhexidine Gluconate Cloth  6 each Topical Daily  . clonazePAM  2 mg Per Tube BID  . feeding supplement (PRO-STAT SUGAR FREE 64)  60 mL Per Tube TID  . free water  300 mL Per Tube Q6H  . HYDROmorphone  2 mg Per Tube Q4H  . insulin aspart  0-15 Units Subcutaneous Q4H  . mouth rinse  15 mL Mouth Rinse 10 times per day  . pantoprazole sodium  40 mg Per Tube Daily  . polyethylene glycol  17 g Oral BID  . sodium chloride flush  10-40 mL Intracatheter Q12H     LOS: 26 days   Shelley Blood, MD [facility] Office  [phone] Pager - Text Page per Amion  If 7PM-7AM, please contact night-coverage per Amion 12/08/2018, 8:23 AM

## 2018-12-08 NOTE — Progress Notes (Signed)
[place] for Heparin Indication: suspected PE  Allergies  Allergen Reactions  . Fish Allergy Hives and Shortness Of Breath    All fish and fish derivatives  . Other Hives, Shortness Of Breath and Rash    All Pitted Fruit  . Peanuts [Peanut Oil]   . Shellfish Allergy     Patient Measurements: Height: 5\' 5"  (165.1 cm) Weight: 212 lb 1.3 oz (96.2 kg) IBW/kg (Calculated) : 57 Heparin Dosing Weight: 80kg  Vital Signs: Temp: 99.5 F (37.5 C) (10/24 0500) Temp Source: Oral (10/24 0500) BP: 123/54 (10/24 0500) Pulse Rate: 110 (10/24 0500)  Labs: Recent Labs    12/06/18 0545 12/07/18 0408  12/07/18 1432 12/07/18 2221 12/08/18 0426 12/08/18 0430  HGB 10.6* 10.5*   < > 9.2*  --  10.2* 9.8*  HCT 37.8 39.1   < > 27.0*  --  30.0* 36.0  PLT 251 230  --   --   --   --  200  HEPARINUNFRC  --   --   --   --  0.44  --  0.28*  CREATININE 1.72* 1.59*  --   --   --   --   --    < > = values in this interval not displayed.    Estimated Creatinine Clearance: 41.6 mL/min (A) (by C-G formula based on SCr of 1.59 mg/dL (H)).   Medications:  Scheduled:  . chlorhexidine  15 mL Mouth/Throat BID  . Chlorhexidine Gluconate Cloth  6 each Topical Daily  . clonazePAM  2 mg Per Tube BID  . feeding supplement (PRO-STAT SUGAR FREE 64)  60 mL Per Tube TID  . free water  300 mL Per Tube Q6H  . HYDROmorphone  2 mg Per Tube Q4H  . insulin aspart  0-15 Units Subcutaneous Q4H  . mouth rinse  15 mL Mouth Rinse 10 times per day  . pantoprazole sodium  40 mg Per Tube Daily  . polyethylene glycol  17 g Oral BID  . sodium chloride flush  10-40 mL Intracatheter Q12H   Infusions:  . sodium chloride Stopped (12/03/18 1459)  . feeding supplement (PIVOT 1.5 CAL) 1,000 mL (12/07/18 2344)  . fentaNYL infusion INTRAVENOUS 50 mcg/hr (12/08/18 0500)  . heparin 1,300 Units/hr (12/08/18 0500)  . norepinephrine (LEVOPHED) Adult infusion 7 mcg/min (12/08/18 0500)  .  propofol (DIPRIVAN) infusion 10 mcg/kg/min (12/08/18 0500)    Assessment: 56 yoF admitted on 9/28 for COVID-19 pneumonia.  She has been on prophylactic enoxaparin, CT angio chest negative for PE 10/3 and bilateral lower extremity venous duplex negative for DVT.  Pharmacy is consulted to increase to treatment dose heparin on 10/23 for suspected PE.  CrCl > 30, Hgb remains stable ~10.5, Plt WNL.  No bleeding or complications reported. D-dimer 2.12 Most recent Lovenox dose, 40mg  [place] given on 10/23 at ~09:00.  Heparin level therapeutic down to slightly subtherapeutic (0.28) on gtt at 1300 units/hr. No issues with line or bleeding reported per RN. Hgb down slightly to 9.8.  Goal of Therapy:  Heparin level 0.3-0.7 units/ml Monitor platelets by anticoagulation protocol: Yes   Plan:  Increase heparin IV infusion to 1450 units/hr F/u heparin level in 6 hours  Sherlon Handing, PharmD, BCPS [facility] Clinical pharmacist phone [phone] 12/08/2018 5:45 AM

## 2018-12-08 NOTE — Progress Notes (Signed)
Updated patient s daughter Lelon Huh via phone. Ell[place]e Sia

## 2018-12-08 NOTE — Progress Notes (Signed)
Video call completed with daughter and other family member. Patient intubated, sedated and not responding to family.

## 2018-12-08 NOTE — Progress Notes (Signed)
[place] for Heparin Indication: suspected PE  Allergies  Allergen Reactions  . Fish Allergy Hives and Shortness Of Breath    All fish and fish derivatives  . Other Hives, Shortness Of Breath and Rash    All Pitted Fruit  . Peanuts [Peanut Oil]   . Shellfish Allergy     Patient Measurements: Height: 5\' 5"  (165.1 cm) Weight: 212 lb 1.3 oz (96.2 kg) IBW/kg (Calculated) : 57 Heparin Dosing Weight: 80kg  Vital Signs: Temp: 99.1 F (37.3 C) (10/24 0732) Temp Source: Oral (10/24 0732) BP: 111/51 (10/24 1134) Pulse Rate: 116 (10/24 1134)  Labs: Recent Labs    12/06/18 0545 12/07/18 0408  12/07/18 1432 12/07/18 2221 12/08/18 0426 12/08/18 0430  HGB 10.6* 10.5*   < > 9.2*  --  10.2* 9.8*  HCT 37.8 39.1   < > 27.0*  --  30.0* 36.0  PLT 251 230  --   --   --   --  200  HEPARINUNFRC  --   --   --   --  0.44  --  0.28*  CREATININE 1.72* 1.59*  --   --   --   --  1.43*   < > = values in this interval not displayed.    Estimated Creatinine Clearance: 46.2 mL/min (A) (by C-G formula based on SCr of 1.43 mg/dL (H)).   Medications:  Scheduled:  . chlorhexidine  15 mL Mouth/Throat BID  . Chlorhexidine Gluconate Cloth  6 each Topical Daily  . clonazePAM  2 mg Per Tube BID  . feeding supplement (PRO-STAT SUGAR FREE 64)  60 mL Per Tube TID  . free water  300 mL Per Tube Q4H  . HYDROmorphone  2 mg Per Tube Q4H  . insulin aspart  0-15 Units Subcutaneous Q4H  . mouth rinse  15 mL Mouth Rinse 10 times per day  . pantoprazole sodium  40 mg Per Tube Daily  . polyethylene glycol  17 g Oral BID  . sodium chloride flush  10-40 mL Intracatheter Q12H  . sodium zirconium cyclosilicate  10 g Oral TID   Infusions:  . sodium chloride Stopped (12/03/18 1459)  . feeding supplement (PIVOT 1.5 CAL) 1,000 mL (12/07/18 2344)  . fentaNYL infusion INTRAVENOUS 50 mcg/hr (12/08/18 0700)  . heparin 1,450 Units/hr (12/08/18 0700)  . norepinephrine (LEVOPHED)  Adult infusion 3 mcg/min (12/08/18 0700)  . propofol (DIPRIVAN) infusion 10 mcg/kg/min (12/08/18 0700)    Assessment: 44 yoF admitted on 9/28 for COVID-19 pneumonia.  She has been on prophylactic enoxaparin, CT angio chest negative for PE 10/3 and bilateral lower extremity venous duplex negative for DVT.  Pharmacy is consulted to increase to treatment dose enoxaparin on 10/23 for suspected PE.    Heparin level 0.39, therapeutic on heparin at 1450 units/hr No bleeding or complications reported. CBC: Hgb down to 9.8, Plt 200k SCr decreased to 1.43 with CrCl ~ 46 ml/min. D-dimer 2.59  Goal of Therapy:  Heparin level 0.3-0.7 units/ml Monitor platelets by anticoagulation protocol: Yes   Plan:  Continue heparin IV infusion at 1450 units/hr Heparin level 6 hours after starting Daily heparin level and CBC Continue to monitor for s/s bleeding thrombosis.   Gretta Arab PharmD, BCPS Clinical pharmacist phone 7am- 5pm: [phone] 12/08/2018 11:49 AM

## 2018-12-08 NOTE — Progress Notes (Signed)
2130- Patients daughter called and updated, she was upset that patient is not following commands or moving extremities, she states she was told yesterday she was following commands. Explained to daughter that sometimes responsiveness fluctuates. Per report patient not moving and not following commands, and per assessment in chart patient hasn't been following commands or moving arms or legs..2230- Patients daughter and mother video chatted with patient.

## 2018-12-08 NOTE — Progress Notes (Signed)
  Echocardiogram 2D Echocardiogram has been performed.  Shelley Allison 12/08/2018, 4:18 PM

## 2018-12-08 NOTE — Progress Notes (Addendum)
[facility]  Formal echo shows normal RV size and function.  Will d/c heparin infusion.  Change back to prophylactic lovenox.  Roselie Awkward, MD [place] [facility] Pager: [phone] Cell: [phone] If no response, call [phone]

## 2018-12-08 NOTE — Progress Notes (Signed)
[place] for Heparin Indication: suspected PE  Allergies  Allergen Reactions  . Fish Allergy Hives and Shortness Of Breath    All fish and fish derivatives  . Other Hives, Shortness Of Breath and Rash    All Pitted Fruit  . Peanuts [Peanut Oil]   . Shellfish Allergy     Patient Measurements: Height: 5\' 5"  (165.1 cm) Weight: 204 lb 9.4 oz (92.8 kg) IBW/kg (Calculated) : 57 Heparin Dosing Weight: 80kg  Vital Signs: Temp: 99.6 F (37.6 C) (10/23 2300) Temp Source: Oral (10/23 2300) BP: 130/56 (10/24 0000) Pulse Rate: 109 (10/24 0000)  Labs: Recent Labs    12/05/18 0500  12/06/18 0545 12/07/18 0408 12/07/18 1238 12/07/18 1432 12/07/18 2221  HGB 10.2*   < > 10.6* 10.5* 10.2* 9.2*  --   HCT 35.9*   < > 37.8 39.1 30.0* 27.0*  --   PLT 214  --  251 230  --   --   --   HEPARINUNFRC  --   --   --   --   --   --  0.44  CREATININE 1.69*  --  1.72* 1.59*  --   --   --    < > = values in this interval not displayed.    Estimated Creatinine Clearance: 40.8 mL/min (A) (by C-G formula based on SCr of 1.59 mg/dL (H)).   Medications:  Scheduled:  . chlorhexidine  15 mL Mouth/Throat BID  . Chlorhexidine Gluconate Cloth  6 each Topical Daily  . clonazePAM  2 mg Per Tube BID  . feeding supplement (PRO-STAT SUGAR FREE 64)  60 mL Per Tube TID  . free water  300 mL Per Tube Q6H  . HYDROmorphone  2 mg Per Tube Q4H  . insulin aspart  0-15 Units Subcutaneous Q4H  . mouth rinse  15 mL Mouth Rinse 10 times per day  . pantoprazole sodium  40 mg Per Tube Daily  . polyethylene glycol  17 g Oral BID  . sodium chloride flush  10-40 mL Intracatheter Q12H   Infusions:  . sodium chloride Stopped (12/03/18 1459)  . feeding supplement (PIVOT 1.5 CAL) 1,000 mL (12/07/18 2344)  . fentaNYL infusion INTRAVENOUS 25 mcg/hr (12/08/18 0000)  . heparin 1,300 Units/hr (12/08/18 0000)  . norepinephrine (LEVOPHED) Adult infusion 8 mcg/min (12/08/18 0000)  .  propofol (DIPRIVAN) infusion 10 mcg/kg/min (12/08/18 0000)    Assessment: 37 yoF admitted on 9/28 for COVID-19 pneumonia.  She has been on prophylactic enoxaparin, CT angio chest negative for PE 10/3 and bilateral lower extremity venous duplex negative for DVT.  Pharmacy is consulted to increase to treatment dose heparin on 10/23 for suspected PE.  CrCl > 30, Hgb remains stable ~10.5, Plt WNL.  No bleeding or complications reported. D-dimer 2.12 Most recent Lovenox dose, 40mg  [place] given on 10/23 at ~09:00.  Heparin level therapeutic (0.44) on gtt at 1300 units/hr. No bleeding noted.  Goal of Therapy:  Heparin level 0.3-0.7 units/ml Monitor platelets by anticoagulation protocol: Yes   Plan:  Continue heparin IV infusion at 1300 units/hr Daily heparin level and CBC Continue for s/s bleeding, thrombosis.   Gretta Arab PharmD, BCPS Clinical pharmacist phone 7am- 5pm: [phone] 12/08/2018 12:14 AM

## 2018-12-09 ENCOUNTER — Inpatient Hospital Stay (HOSPITAL_COMMUNITY): Payer: BC Managed Care – PPO

## 2018-12-09 LAB — RENAL FUNCTION PANEL
Albumin: 1.7 g/dL — ABNORMAL LOW (ref 3.5–5.0)
Anion gap: 6 (ref 5–15)
BUN: 99 mg/dL — ABNORMAL HIGH (ref 8–23)
CO2: 34 mmol/L — ABNORMAL HIGH (ref 22–32)
Calcium: 8.3 mg/dL — ABNORMAL LOW (ref 8.9–10.3)
Chloride: 116 mmol/L — ABNORMAL HIGH (ref 98–111)
Creatinine, Ser: 1.65 mg/dL — ABNORMAL HIGH (ref 0.44–1.00)
GFR calc Af Amer: 38 mL/min — ABNORMAL LOW (ref >60)
GFR calc non Af Amer: 33 mL/min — ABNORMAL LOW (ref >60)
Glucose, Bld: 131 mg/dL — ABNORMAL HIGH (ref 70–99)
Phosphorus: 3.5 mg/dL (ref 2.5–4.6)
Potassium: 5 mmol/L (ref 3.5–5.1)
Sodium: 156 mmol/L — ABNORMAL HIGH (ref 135–145)

## 2018-12-09 LAB — POCT I-STAT 7, (LYTES, BLD GAS, ICA,H+H)
Acid-Base Excess: 5 mmol/L — ABNORMAL HIGH (ref 0.0–2.0)
Bicarbonate: 34.8 mmol/L — ABNORMAL HIGH (ref 20.0–28.0)
Calcium, Ion: 1.22 mmol/L (ref 1.15–1.40)
HCT: 30 % — ABNORMAL LOW (ref 36.0–46.0)
Hemoglobin: 10.2 g/dL — ABNORMAL LOW (ref 12.0–15.0)
O2 Saturation: 84 %
Patient temperature: 37.8
Potassium: 4.9 mmol/L (ref 3.5–5.1)
Sodium: 156 mmol/L — ABNORMAL HIGH (ref 135–145)
TCO2: 37 mmol/L — ABNORMAL HIGH (ref 22–32)
pCO2 arterial: 86.3 mmHg (ref 32.0–48.0)
pH, Arterial: 7.217 — ABNORMAL LOW (ref 7.350–7.450)
pO2, Arterial: 64 mmHg — ABNORMAL LOW (ref 83.0–108.0)

## 2018-12-09 LAB — CBC
HCT: 35.1 % — ABNORMAL LOW (ref 36.0–46.0)
Hemoglobin: 9.5 g/dL — ABNORMAL LOW (ref 12.0–15.0)
MCH: 27.7 pg (ref 26.0–34.0)
MCHC: 27.1 g/dL — ABNORMAL LOW (ref 30.0–36.0)
MCV: 102.3 fL — ABNORMAL HIGH (ref 80.0–100.0)
Platelets: 213 10*3/uL (ref 150–400)
RBC: 3.43 MIL/uL — ABNORMAL LOW (ref 3.87–5.11)
RDW: 16 % — ABNORMAL HIGH (ref 11.5–15.5)
WBC: 13.4 10*3/uL — ABNORMAL HIGH (ref 4.0–10.5)
nRBC: 0.1 % (ref 0.0–0.2)

## 2018-12-09 LAB — GLUCOSE, CAPILLARY
Glucose-Capillary: 106 mg/dL — ABNORMAL HIGH (ref 70–99)
Glucose-Capillary: 114 mg/dL — ABNORMAL HIGH (ref 70–99)
Glucose-Capillary: 129 mg/dL — ABNORMAL HIGH (ref 70–99)
Glucose-Capillary: 132 mg/dL — ABNORMAL HIGH (ref 70–99)
Glucose-Capillary: 136 mg/dL — ABNORMAL HIGH (ref 70–99)
Glucose-Capillary: 137 mg/dL — ABNORMAL HIGH (ref 70–99)
Glucose-Capillary: 145 mg/dL — ABNORMAL HIGH (ref 70–99)

## 2018-12-09 LAB — TRIGLYCERIDES: Triglycerides: 125 mg/dL (ref <150)

## 2018-12-09 MED ORDER — ENOXAPARIN SODIUM 40 MG/0.4ML ~~LOC~~ SOLN
40.0000 mg | Freq: Two times a day (BID) | SUBCUTANEOUS | Status: DC
  Administered 2018-12-09 – 2018-12-12 (×7): 40 mg via SUBCUTANEOUS
  Filled 2018-12-09 (×7): qty 0.4

## 2018-12-09 MED ORDER — HYDROMORPHONE HCL 2 MG PO TABS
1.0000 mg | ORAL_TABLET | ORAL | Status: DC
  Administered 2018-12-09 – 2018-12-10 (×6): 1 mg
  Filled 2018-12-09 (×5): qty 1

## 2018-12-09 MED ORDER — CLONAZEPAM 1 MG PO TABS
1.0000 mg | ORAL_TABLET | Freq: Two times a day (BID) | ORAL | Status: DC
  Administered 2018-12-09 – 2018-12-10 (×2): 1 mg
  Filled 2018-12-09 (×2): qty 1

## 2018-12-09 MED ORDER — FREE WATER
400.0000 mL | Status: DC
  Administered 2018-12-09 – 2018-12-10 (×8): 400 mL

## 2018-12-09 NOTE — Progress Notes (Signed)
NAME:  Shelley Allison, MRN:  [id number], DOB:  06-Feb-1956, LOS: 27 ADMISSION DATE:  11/08/2018, CONSULTATION DATE:  10/16 REFERRING MD:  [name], CHIEF COMPLAINT:  Dyspnea   Brief History   63 year old female admitted on November 12, 2018 in the setting of worsening hypoxemic respiratory failure from COVID-19 pneumonia.  Past Medical History  HTN Cerebral artery aneurysm rupture  Significant Hospital Events   09/28 admit, start decadron/remdesivir 09/29 given tocilizumab in setting of worsening hypoxia; pt deferred option for convalescent plasma 09/30 transfer to ICU, PCCM consulted 10/03 transfer back to PCU 10/16 PCCM reconsulted for presistent hypoxia; restart solumedrol  10/17 transferred back to ICU with AMS and hypoxia, required intubation 10/18 restart remdesivir 10/22 left pneumothorax, chest tube placed  Consults:  PCCM  Procedures:  ETT 10/17 >> Rt radial a line 10/18 >>   Significant Diagnostic Tests:  Doppler b/l legs 10/01 >> no DVT CT angio chest 10/03 >> borderline LAN up to 1.5 cm, b/l patchy consolidation and GGO Echo 10/16 >> EF 70 to 75%, PAS 50 mmHg, RV size and function normal CT head 10/17 >> s/p Lt frontotemporal craniotomy, encephalomalacia Lt temporal lobe, chronic small posterior temporal cortical infarct, Lt aneurysm clipping EEG 10/19 >> mild general background slowing 10/24 bedside echo> dilated RV, LV grossly normal  Micro Data:  SARS COV2 9/28 >> POSITIVE Sputum 10/18 >> Urine 10/18 >> negative  Antimicrobials/COVID Rx:  Remdesivir 9/28 >> 10/02 Solumedrol 9/28 >> 10/07 Tocilizumab 9/29  Solumedrol 10/16 >> 10/18 Remdesivir 10/18 >>  Decadron 10/18 >>   Interim history/subjective:   Some drainage from left chest tube Remains mechanically ventilated Minimally responsive on ventilator with sedation held  Objective   Blood pressure (!) 130/44, pulse (!) 106, temperature 99.3 F (37.4 C), temperature source Oral, resp. rate (!)  35, height 5\' 5"  (1.651 m), weight 95.3 kg, SpO2 100 %. CVP:  [1 mmHg-14 mmHg] 12 mmHg  Vent Mode: PRVC FiO2 (%):  [70 %] 70 % Set Rate:  [35 bmp] 35 bmp Vt Set:  [450 mL] 450 mL PEEP:  [10 cmH20] 10 cmH20 Plateau Pressure:  [31 cmH20-36 cmH20] 36 cmH20   Intake/Output Summary (Last 24 hours) at 12/09/2018 0834 Last data filed at 12/09/2018 0800 Gross per 24 hour  Intake 2312.73 ml  Output 2735 ml  Net -422.27 ml   Filed Weights   12/07/18 0419 12/08/18 0403 12/09/18 0500  Weight: 92.8 kg 96.2 kg 95.3 kg    Examination:  General:  In bed on vent HENT: NCAT ETT in place PULM: CTA B, vent supported breathing CV: RRR, no mgr GI: BS+, soft, nontender MSK: normal bulk and tone Neuro: sedated on vent   10/22 CXR >personally reviewed: chest tube in place, bilateral airspace disease, ETT in place  Resolved Hospital Problem list     Assessment & Plan:  Acute hypoxemic/hypercapnic respiratory failure in setting of COVID-19 pneumonia Concern for fibrotic change of lung post COVID 10/21-25: Worsening respiratory acidosis, worsening dead space, worsening lung compliance, peak and plateau elevated 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 Continue mechanical ventilation per ARDS protocol Target TVol 6-8cc/kgIBW Target Plateau Pressure < 30cm H20 Target driving pressure less than 15 cm of water Target PaO2 55-65: titrate PEEP/FiO2 per protocol As long as PaO2 to FiO2 ratio is less than 1:150 position in prone position for 16 hours a day Check CVP daily if CVL in place Target CVP less than 4, diurese as necessary Ventilator associated pneumonia prevention protocol 10/25 plan:  remain supine today, wean vent per protocol  Shock/hypotension: mild, sedation related Wean off levophed for MAP > 65  Pneumothorax Some serous drainage around site Chest tube on water seal, leaving in while on high vent settings Monitor chest tube site  Acute metabolic encephalopathy, was  able to talk just prior to intubation so suspect most of her encephalopathy now is sedation related Severe ventilator dyssynchrony Decrease oral sedatives by 1/2 today Use IV continuous sedation titrated to RASS -1 to -2  AKI: improving, with hypernatremia Adjust free water Monitor BMET and UOP Replace electrolytes as needed   Best practice:  Diet: tube feeding Pain/Anxiety/Delirium protocol (if indicated): yes, RASS target -3 VAP protocol (if indicated): yes DVT prophylaxis: lovenox per protocol GI prophylaxis: pantoprazole Glucose control: per TRH Mobility: bed rest Code Status: full Family Communication: per [facility] Disposition: remain in ICU  Labs   CBC: Recent Labs  Lab 12/03/18 0509  12/04/18 0500 12/05/18 0500  12/06/18 0545 12/07/18 0408  12/08/18 0430 12/08/18 1245 12/08/18 1447 12/09/18 0411 12/09/18 0445  WBC 10.7*  --  7.1 7.9  --  9.4 11.3*  --  12.3*  --   --   --  13.4*  NEUTROABS 9.3*  --  5.8 6.0  --  6.7  --   --   --   --   --   --   --   HGB 11.3*   < > 10.3* 10.2*   < > 10.6* 10.5*   < > 9.8* 10.9* 9.5* 10.2* 9.5*  HCT 37.7   < > 34.8* 35.9*   < > 37.8 39.1   < > 36.0 32.0* 28.0* 30.0* 35.1*  MCV 93.3  --  94.6 96.8  --  100.0 103.2*  --  101.7*  --   --   --  102.3*  PLT 229  --  198 214  --  251 230  --  200  --   --   --  213   < > = values in this interval not displayed.    Basic Metabolic Panel: Recent Labs  Lab 12/02/18 1938 12/03/18 0509  12/03/18 1800  12/04/18 0500 12/05/18 0500  12/06/18 0545 12/07/18 0408  12/08/18 0430 12/08/18 1245 12/08/18 1447 12/09/18 0411 12/09/18 0445  NA  --  142   < >  --    < > 148* 151*   < > 151* 152*   < > 153* 153* 154* 156* 156*  K  --  4.5   < >  --    < > 4.8 5.3*   < > 5.5* 5.7*   < > 5.6* 4.9 4.9 4.9 5.0  CL  --  103  --   --   --  109 111  --  114* 117*  --  119*  --   --   --  116*  CO2  --  29  --   --   --  31 31  --  31 32  --  29  --   --   --  34*  GLUCOSE  --  147*  --   --   --   142* 116*  --  154* 201*  --  135*  --   --   --  131*  BUN  --  72*  --   --   --  88* 94*  --  119* 96*  --  93*  --   --   --  99*  CREATININE  --  1.99*  --   --   --  1.88* 1.69*  --  1.72* 1.59*  --  1.43*  --   --   --  1.65*  CALCIUM  --  8.6*  --   --   --  8.6* 8.6*  --  8.7* 8.3*  --  8.1*  --   --   --  8.3*  MG 3.1* 3.2*  --  2.9*  --  3.4*  --   --   --  3.0*  --   --   --   --   --   --   PHOS 5.5* 4.6  --  4.1  --  3.8  --   --   --   --   --   --   --   --   --  3.5   < > = values in this interval not displayed.   GFR: Estimated Creatinine Clearance: 39.8 mL/min (A) (by C-G formula based on SCr of 1.65 mg/dL (H)). Recent Labs  Lab 12/03/18 0509 12/04/18 0500  12/06/18 0545 12/07/18 0408 12/07/18 1400 12/08/18 0430 12/09/18 0445  PROCALCITON 0.42 0.15  --   --   --   --   --   --   WBC 10.7* 7.1   < > 9.4 11.3*  --  12.3* 13.4*  LATICACIDVEN  --   --   --   --   --  0.4*  --   --    < > = values in this interval not displayed.    Liver Function Tests: Recent Labs  Lab 12/04/18 0500 12/05/18 0500 12/06/18 0545 12/07/18 0408 12/08/18 0430 12/09/18 0445  AST 22 20 15 20 20   --   ALT 36 30 24 23 21   --   ALKPHOS 64 60 72 71 63  --   BILITOT 0.4 0.4 0.1* 0.3 0.2*  --   PROT 6.5 6.9 6.8 6.3* 6.1*  --   ALBUMIN 2.3* 2.4* 2.4* 2.0* 1.8* 1.7*   No results for input(s): LIPASE, AMYLASE in the last 168 hours. No results for input(s): AMMONIA in the last 168 hours.  ABG    Component Value Date/Time   PHART 7.217 (L) 12/09/2018 0411   PCO2ART 86.3 (HH) 12/09/2018 0411   PO2ART 64.0 (L) 12/09/2018 0411   HCO3 34.8 (H) 12/09/2018 0411   TCO2 37 (H) 12/09/2018 0411   ACIDBASEDEF 1.0 12/07/2018 1432   O2SAT 84.0 12/09/2018 0411     Coagulation Profile: No results for input(s): INR, PROTIME in the last 168 hours.  Cardiac Enzymes: No results for input(s): CKTOTAL, CKMB, CKMBINDEX, TROPONINI in the last 168 hours.  HbA1C: Hemoglobin A1C  Date/Time Value  Ref Range Status  05/15/2015 03:52 PM 5.5  Final  04/25/2014 04:43 PM 5.8  Final   Hgb A1c MFr Bld  Date/Time Value Ref Range Status  12/02/2018 08:38 AM 6.4 (H) 4.8 - 5.6 % Final    Comment:    (NOTE)         Prediabetes: 5.7 - 6.4         Diabetes: >6.4         Glycemic control for adults with diabetes: <7.0     CBG: Recent Labs  Lab 12/08/18 1624 12/08/18 2019 12/09/18 0023 12/09/18 0425 12/09/18 0732  GLUCAP 123* 133* 136* 114* 106*     Critical care time: 35 minutes       12/11/18  [place] Bells, MD [place] [facility] Pager: [phone] Cell: [phone] If no response, call [phone]

## 2018-12-09 NOTE — Progress Notes (Signed)
Per CCM 10 cc normal saline flushed down ETT  With moderate return of clear fluid. Patient tolerated fine.

## 2018-12-09 NOTE — Progress Notes (Signed)
Instilled 10 CC Flush down ett per MD order. Very scant amount of clear/white secretions obtained. No distress noted, no increased WOB, VS within normal limits.  RT will continue to monitor.

## 2018-12-09 NOTE — Progress Notes (Addendum)
[place] TEAM 1 - Stepdown/ICU TEAM  Shelley Allison  IEP:[id number] DOB: 03/16/1955 DOA: 2018/11/26 PCP: Shelley Bock, DO    Brief Narrative:  [phone] with a history of HTN and cerebral artery aneurysm rupture who presented to the ED 9/28 with 1 week of nausea vomiting and diarrhea followed by progressive shortness of breath with fever which began on 9/27.  She had attended a large wedding prior to the onset of her symptoms.  In the ED she was found to be hypoxic and confirmed to be SARS-CoV-2 positive.  Chest x-ray revealed bilateral infiltrates.  Significant Events: 9/21 approximate onset of GI symptoms 9/27 onset of pulmonary symptoms 9/28 admit  9/30 transfer to ICU 10/3 transfer back to PCU 10/16 TTE 10/17 transfer to ICU with respiratory distress/obtundation -intubation 10/22 small R pneumothorax -chest tube placed  COVID-19 specific Treatment: Remdesivir 9/28 >10/2 Solumedrol 9/28 > 10/7 Actemra 9/29 Convalescent plasma - patient declined Solumedrol 10/16 > 10/18 Remdesivir 10/18 > 10/22 Decadron 10/18 > 10/22  Subjective: No significant change in clinical status appreciable today.  Sedation is off with exception to per tube sedation with patient not yet awake or interactive.  Assessment & Plan:  COVID pneumonia - persisting acute hypoxic respiratory failure /acute hypercarbic respiratory failure - ?post-COVID lung fibrosis  CT angio chest negative for PE 10/3 - bilateral lower extremity venous duplex negative for DVT - completed full courses of Solu-Medrol, Remdesivir, and Actemra treatment during initial portion of hospital stay - was given a repeat course of remdesivir and Decadron -ventilator management per PCCM - expected prolonged course if recovery occurs   Small right-sided pneumothorax Apparent on CXR 10/22 - chest tube care per PCCM   Acute encephalopathy Patient was found in her room 10/17 acutely altered with severe hypoxia -though her hypoxia improved  with increased oxygen support her mental status did not and she required urgent intubation -an EEG revealed no evidence of seizure -CT head was unrevealing -presently sedated on ventilator -by reports at the time of her intubation the patient was responding to her name and able to follow simple commands  HTN Blood pressure controlled  Acute kidney injury Creatinine peaked at 2.14 -renal function is fluctuating but overall trend is relatively stable  Recent Labs  Lab 12/05/18 0500 12/06/18 0545 12/07/18 0408 12/08/18 0430 12/09/18 0445  CREATININE 1.69* 1.72* 1.59* 1.43* 1.65*    Uremia BUN markedly elevated -second course of steroid discontinued 10/22 -holding diuretic -gently expanding free water - continue to monitor  Recent Labs  Lab 12/05/18 0500 12/06/18 0545 12/07/18 0408 12/08/18 0430 12/09/18 0445  BUN 94* 119* 96* 93* 99*    Hypernatremia Sodium slow to improve -increase free water again today  Transaminitis resolved - likely due to the virus itself +/- Remdesivir  History of ruptured cerebral artery aneurysm  DVT prophylaxis: Lovenox Code Status: FULL CODE Family Communication: I spoke with the patient's daughter via telephone 10/25 Disposition Plan: ICU  Consultants:  PCCM  Antimicrobials:  Ceftriaxone 9/28 > 10/1  Azithromycin 9/28 > 9/29  Objective: Blood pressure (!) 130/44, pulse (!) 106, temperature 99.3 F (37.4 C), temperature source Oral, resp. rate (!) 35, height 5\' 5"  (1.651 m), weight 95.3 kg, SpO2 100 %.  Intake/Output Summary (Last 24 hours) at 12/09/2018 0837 Last data filed at 12/09/2018 0800 Gross per 24 hour  Intake 2312.73 ml  Output 2735 ml  Net -422.27 ml   Filed Weights   12/07/18 0419 12/08/18 0403 12/09/18 0500  Weight: 92.8  kg 96.2 kg 95.3 kg    Examination: General: Sedate -unresponsive Lungs: Fine crackles diffusely w/o change Cardiovascular: RRR Abdomen: NT/ND, soft, BS+ Extremities: No significant edema  bilateral lower extremities  CBC: Recent Labs  Lab 12/04/18 0500 12/05/18 0500  12/06/18 0545 12/07/18 0408  12/08/18 0430  12/08/18 1447 12/09/18 0411 12/09/18 0445  WBC 7.1 7.9  --  9.4 11.3*  --  12.3*  --   --   --  13.4*  NEUTROABS 5.8 6.0  --  6.7  --   --   --   --   --   --   --   HGB 10.3* 10.2*   < > 10.6* 10.5*   < > 9.8*   < > 9.5* 10.2* 9.5*  HCT 34.8* 35.9*   < > 37.8 39.1   < > 36.0   < > 28.0* 30.0* 35.1*  MCV 94.6 96.8  --  100.0 103.2*  --  101.7*  --   --   --  102.3*  PLT 198 214  --  251 230  --  200  --   --   --  213   < > = values in this interval not displayed.   Basic Metabolic Panel: Recent Labs  Lab 12/03/18 1800  12/04/18 0500  12/07/18 0408  12/08/18 0430  12/08/18 1447 12/09/18 0411 12/09/18 0445  NA  --    < > 148*   < > 152*   < > 153*   < > 154* 156* 156*  K  --    < > 4.8   < > 5.7*   < > 5.6*   < > 4.9 4.9 5.0  CL  --   --  109   < > 117*  --  119*  --   --   --  116*  CO2  --   --  31   < > 32  --  29  --   --   --  34*  GLUCOSE  --   --  142*   < > 201*  --  135*  --   --   --  131*  BUN  --   --  88*   < > 96*  --  93*  --   --   --  99*  CREATININE  --   --  1.88*   < > 1.59*  --  1.43*  --   --   --  1.65*  CALCIUM  --   --  8.6*   < > 8.3*  --  8.1*  --   --   --  8.3*  MG 2.9*  --  3.4*  --  3.0*  --   --   --   --   --   --   PHOS 4.1  --  3.8  --   --   --   --   --   --   --  3.5   < > = values in this interval not displayed.   GFR: Estimated Creatinine Clearance: 39.8 mL/min (A) (by C-G formula based on SCr of 1.65 mg/dL (H)).  Liver Function Tests: Recent Labs  Lab 12/05/18 0500 12/06/18 0545 12/07/18 0408 12/08/18 0430 12/09/18 0445  AST 20 15 20 20   --   ALT 30 24 23 21   --   ALKPHOS 60 72 71 63  --   BILITOT 0.4 0.1* 0.3 0.2*  --  PROT 6.9 6.8 6.3* 6.1*  --   ALBUMIN 2.4* 2.4* 2.0* 1.8* 1.7*    HbA1C: Hemoglobin A1C  Date/Time Value Ref Range Status  05/15/2015 03:52 PM 5.5  Final  04/25/2014 04:43  PM 5.8  Final   Hgb A1c MFr Bld  Date/Time Value Ref Range Status  12/02/2018 08:38 AM 6.4 (H) 4.8 - 5.6 % Final    Comment:    (NOTE)         Prediabetes: 5.7 - 6.4         Diabetes: >6.4         Glycemic control for adults with diabetes: <7.0     Scheduled Meds: . chlorhexidine  15 mL Mouth/Throat BID  . Chlorhexidine Gluconate Cloth  6 each Topical Daily  . clonazePAM  2 mg Per Tube BID  . enoxaparin (LOVENOX) injection  0.5 mg/kg Subcutaneous Q24H  . feeding supplement (PRO-STAT SUGAR FREE 64)  60 mL Per Tube TID  . free water  300 mL Per Tube Q4H  . HYDROmorphone  2 mg Per Tube Q4H  . insulin aspart  0-15 Units Subcutaneous Q4H  . mouth rinse  15 mL Mouth Rinse 10 times per day  . pantoprazole sodium  40 mg Per Tube Daily  . polyethylene glycol  17 g Oral BID  . sodium chloride flush  10-40 mL Intracatheter Q12H  . sodium zirconium cyclosilicate  10 g Oral TID     LOS: 27 days   Shelley Altes, MD [facility] Office  [phone] Pager - Text Page per Shea Evans  If 7PM-7AM, please contact night-coverage per Amion 12/09/2018, 8:37 AM

## 2018-12-09 NOTE — Progress Notes (Signed)
ABG Panic values shown to RN.  No changes were made to ventilator at this time.

## 2018-12-10 ENCOUNTER — Inpatient Hospital Stay (HOSPITAL_COMMUNITY): Payer: BC Managed Care – PPO

## 2018-12-10 LAB — GLUCOSE, CAPILLARY
Glucose-Capillary: 100 mg/dL — ABNORMAL HIGH (ref 70–99)
Glucose-Capillary: 106 mg/dL — ABNORMAL HIGH (ref 70–99)
Glucose-Capillary: 118 mg/dL — ABNORMAL HIGH (ref 70–99)
Glucose-Capillary: 127 mg/dL — ABNORMAL HIGH (ref 70–99)
Glucose-Capillary: 128 mg/dL — ABNORMAL HIGH (ref 70–99)
Glucose-Capillary: 145 mg/dL — ABNORMAL HIGH (ref 70–99)
Glucose-Capillary: 151 mg/dL — ABNORMAL HIGH (ref 70–99)

## 2018-12-10 LAB — POCT I-STAT 7, (LYTES, BLD GAS, ICA,H+H)
Acid-Base Excess: 8 mmol/L — ABNORMAL HIGH (ref 0.0–2.0)
Bicarbonate: 36.9 mmol/L — ABNORMAL HIGH (ref 20.0–28.0)
Calcium, Ion: 1.24 mmol/L (ref 1.15–1.40)
HCT: 29 % — ABNORMAL LOW (ref 36.0–46.0)
Hemoglobin: 9.9 g/dL — ABNORMAL LOW (ref 12.0–15.0)
O2 Saturation: 79 %
Patient temperature: 37.3
Potassium: 5.2 mmol/L — ABNORMAL HIGH (ref 3.5–5.1)
Sodium: 154 mmol/L — ABNORMAL HIGH (ref 135–145)
TCO2: 39 mmol/L — ABNORMAL HIGH (ref 22–32)
pCO2 arterial: 83.2 mmHg (ref 32.0–48.0)
pH, Arterial: 7.257 — ABNORMAL LOW (ref 7.350–7.450)
pO2, Arterial: 54 mmHg — ABNORMAL LOW (ref 83.0–108.0)

## 2018-12-10 LAB — FERRITIN: Ferritin: 1112 ng/mL — ABNORMAL HIGH (ref 11–307)

## 2018-12-10 LAB — BASIC METABOLIC PANEL
Anion gap: 6 (ref 5–15)
BUN: 116 mg/dL — ABNORMAL HIGH (ref 8–23)
CO2: 35 mmol/L — ABNORMAL HIGH (ref 22–32)
Calcium: 8.1 mg/dL — ABNORMAL LOW (ref 8.9–10.3)
Chloride: 116 mmol/L — ABNORMAL HIGH (ref 98–111)
Creatinine, Ser: 1.91 mg/dL — ABNORMAL HIGH (ref 0.44–1.00)
GFR calc Af Amer: 32 mL/min — ABNORMAL LOW (ref >60)
GFR calc non Af Amer: 27 mL/min — ABNORMAL LOW (ref >60)
Glucose, Bld: 128 mg/dL — ABNORMAL HIGH (ref 70–99)
Potassium: 5 mmol/L (ref 3.5–5.1)
Sodium: 157 mmol/L — ABNORMAL HIGH (ref 135–145)

## 2018-12-10 LAB — IRON AND TIBC
Iron: 26 ug/dL — ABNORMAL LOW (ref 28–170)
Saturation Ratios: 21 % (ref 10.4–31.8)
TIBC: 125 ug/dL — ABNORMAL LOW (ref 250–450)
UIBC: 99 ug/dL

## 2018-12-10 LAB — FOLATE: Folate: 4.5 ng/mL — ABNORMAL LOW (ref >5.9)

## 2018-12-10 LAB — CULTURE, RESPIRATORY W GRAM STAIN: Special Requests: NORMAL

## 2018-12-10 LAB — RETICULOCYTES
Immature Retic Fract: 19.1 % — ABNORMAL HIGH (ref 2.3–15.9)
RBC.: 3.41 MIL/uL — ABNORMAL LOW (ref 3.87–5.11)
Retic Count, Absolute: 49.4 10*3/uL (ref 19.0–186.0)
Retic Ct Pct: 1.5 % (ref 0.4–3.1)

## 2018-12-10 LAB — TRIGLYCERIDES: Triglycerides: 170 mg/dL — ABNORMAL HIGH (ref <150)

## 2018-12-10 LAB — VITAMIN B12: Vitamin B-12: 469 pg/mL (ref 180–914)

## 2018-12-10 MED ORDER — FREE WATER
400.0000 mL | Status: DC
  Administered 2018-12-10 – 2018-12-13 (×24): 400 mL

## 2018-12-10 MED ORDER — CEFAZOLIN SODIUM-DEXTROSE 1-4 GM/50ML-% IV SOLN
1.0000 g | Freq: Three times a day (TID) | INTRAVENOUS | Status: DC
  Administered 2018-12-10 – 2018-12-13 (×9): 1 g via INTRAVENOUS
  Filled 2018-12-10 (×10): qty 50

## 2018-12-10 MED ORDER — FOLIC ACID 5 MG/ML IJ SOLN
1.0000 mg | Freq: Every day | INTRAMUSCULAR | Status: AC
  Administered 2018-12-10 – 2018-12-12 (×3): 1 mg via INTRAVENOUS
  Filled 2018-12-10 (×4): qty 0.2

## 2018-12-10 MED ORDER — FUROSEMIDE 10 MG/ML IJ SOLN
80.0000 mg | Freq: Once | INTRAMUSCULAR | Status: AC
  Administered 2018-12-10: 80 mg via INTRAVENOUS
  Filled 2018-12-10: qty 8

## 2018-12-10 MED ORDER — FOLIC ACID 1 MG PO TABS
1.0000 mg | ORAL_TABLET | Freq: Every day | ORAL | Status: DC
  Administered 2018-12-13: 11:00:00 1 mg
  Filled 2018-12-10: qty 1

## 2018-12-10 NOTE — Progress Notes (Signed)
NAME:  Shelley Allison, MRN:  [id number], DOB:  August 12, 1955, LOS: 28 ADMISSION DATE:  08-25-2018, CONSULTATION DATE:  10/16 REFERRING MD:  [name], CHIEF COMPLAINT:  Dyspnea   Brief History   63 year old female admitted on November 12, 2018 in the setting of worsening hypoxemic respiratory failure from COVID-19 pneumonia.  Past Medical History  HTN Cerebral artery aneurysm rupture  Significant Hospital Events   09/28 admit, start decadron/remdesivir 09/29 given tocilizumab in setting of worsening hypoxia; pt deferred option for convalescent plasma 09/30 transfer to ICU, PCCM consulted 10/03 transfer back to PCU 10/16 PCCM reconsulted for presistent hypoxia; restart solumedrol  10/17 transferred back to ICU with AMS and hypoxia, required intubation 10/18 restart remdesivir 10/22 left pneumothorax, chest tube placed  Consults:  PCCM  Procedures:  ETT 10/17 >> Rt radial a line 10/18 >>   Significant Diagnostic Tests:  Doppler b/l legs 10/01 >> no DVT CT angio chest 10/03 >> borderline LAN up to 1.5 cm, b/l patchy consolidation and GGO Echo 10/16 >> EF 70 to 75%, PAS 50 mmHg, RV size and function normal CT head 10/17 >> s/p Lt frontotemporal craniotomy, encephalomalacia Lt temporal lobe, chronic small posterior temporal cortical infarct, Lt aneurysm clipping EEG 10/19 >> mild general background slowing 10/24 bedside echo> dilated RV, LV grossly normal  Micro Data:  SARS COV2 9/28 >> POSITIVE Sputum 10/18 >> Urine 10/18 >> negative  Antimicrobials/COVID Rx:  Remdesivir 9/28 >> 10/02 Solumedrol 9/28 >> 10/07 Tocilizumab 9/29  Solumedrol 10/16 >> 10/18 Remdesivir 10/18 >>  Decadron 10/18 >>   Interim history/subjective:   Minimally responsive with sedation held Remains mechanically ventilated  Objective   Blood pressure (!) 85/52, pulse (!) 108, temperature 99.1 F (37.3 C), temperature source Axillary, resp. rate (!) 35, height 5\' 5"  (1.651 m), weight 95.4 kg, SpO2  98 %. CVP:  [5 mmHg-17 mmHg] 14 mmHg  Vent Mode: PRVC FiO2 (%):  [60 %-80 %] 80 % Set Rate:  [35 bmp] 35 bmp Vt Set:  [450 mL] 450 mL PEEP:  [10 cmH20-14 cmH20] 14 cmH20 Plateau Pressure:  [32 cmH20-38 cmH20] 38 cmH20   Intake/Output Summary (Last 24 hours) at 12/10/2018 1556 Last data filed at 12/10/2018 1300 Gross per 24 hour  Intake 1907.85 ml  Output 1130 ml  Net 777.85 ml   Filed Weights   12/08/18 0403 12/09/18 0500 12/10/18 0500  Weight: 96.2 kg 95.3 kg 95.4 kg    Examination:  General:  In bed on vent HENT: NCAT ETT in place PULM: Crackles bases B, vent supported breathing CV: RRR, no mgr GI: BS+, soft, nontender MSK: normal bulk and tone Neuro: minimal response to external stimuli, no eye opening or response to pain for me   10/26 CXR >personally reviewed: chest tube in place, bilateral interstitial opacifications, ett in place  Resolved Hospital Problem list     Assessment & Plan:  Acute hypoxemic/hypercapnic respiratory failure in setting of COVID-19 pneumonia Concern for fibrotic change of lung post COVID 10/21-25: Worsening respiratory acidosis, worsening dead space, worsening lung compliance, peak and plateau elevated 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 Continue mechanical ventilation per ARDS protocol Target TVol 6-8cc/kgIBW Target Plateau Pressure < 30cm H20 Target driving pressure less than 15 cm of water Target PaO2 55-65: titrate PEEP/FiO2 per protocol As long as PaO2 to FiO2 ratio is less than 1:150 position in prone position for 16 hours a day Check CVP daily if CVL in place Target CVP less than 4, diurese as necessary Ventilator  associated pneumonia prevention protocol 10/26 remain supine today, wean vent per protocol, holding prone positioning because hypercarbia worsened with prone earlier in week  Shock/hypotension: mild, sedation related Wean off levophed for MAP > 65  Pneumothorax Some serous drainage around site  Continue chest tube to water seal Repeat CXR in AM  Acute metabolic encephalopathy, was able to talk just prior to intubation so suspect most of her encephalopathy now is sedation related Severe ventilator dyssynchrony Hold all oral sedatives RASS target -1 to -2 during most of daytime WUA tomorrow morning Would be cautious labeling her as anoxic brain injury as she was speaking prior to intubation  AKI: improving, with hypernatremia Continue free water Monitor BMET and UOP Replace electrolytes as needed   Best practice:  Diet: tube feeding Pain/Anxiety/Delirium protocol (if indicated): yes, RASS target -1 to -2 VAP protocol (if indicated): yes DVT prophylaxis: lovenox per protocol GI prophylaxis: pantoprazole Glucose control: per TRH Mobility: bed rest Code Status: full Family Communication: per [facility] Disposition: remain in ICU  Labs   CBC: Recent Labs  Lab 12/04/18 0500 12/05/18 0500  12/06/18 0545 12/07/18 0408  12/08/18 0430 12/08/18 1245 12/08/18 1447 12/09/18 0411 12/09/18 0445 12/10/18 1206  WBC 7.1 7.9  --  9.4 11.3*  --  12.3*  --   --   --  13.4*  --   NEUTROABS 5.8 6.0  --  6.7  --   --   --   --   --   --   --   --   HGB 10.3* 10.2*   < > 10.6* 10.5*   < > 9.8* 10.9* 9.5* 10.2* 9.5* 9.9*  HCT 34.8* 35.9*   < > 37.8 39.1   < > 36.0 32.0* 28.0* 30.0* 35.1* 29.0*  MCV 94.6 96.8  --  100.0 103.2*  --  101.7*  --   --   --  102.3*  --   PLT 198 214  --  251 230  --  200  --   --   --  213  --    < > = values in this interval not displayed.    Basic Metabolic Panel: Recent Labs  Lab 12/03/18 1800  12/04/18 0500  12/06/18 0545 12/07/18 0408  12/08/18 0430  12/08/18 1447 12/09/18 0411 12/09/18 0445 12/10/18 0440 12/10/18 1206  NA  --    < > 148*   < > 151* 152*   < > 153*   < > 154* 156* 156* 157* 154*  K  --    < > 4.8   < > 5.5* 5.7*   < > 5.6*   < > 4.9 4.9 5.0 5.0 5.2*  CL  --   --  109   < > 114* 117*  --  119*  --   --   --  116* 116*  --    CO2  --   --  31   < > 31 32  --  29  --   --   --  34* 35*  --   GLUCOSE  --   --  142*   < > 154* 201*  --  135*  --   --   --  131* 128*  --   BUN  --   --  88*   < > 119* 96*  --  93*  --   --   --  99* 116*  --   CREATININE  --   --  1.88*   < > 1.72* 1.59*  --  1.43*  --   --   --  1.65* 1.91*  --   CALCIUM  --   --  8.6*   < > 8.7* 8.3*  --  8.1*  --   --   --  8.3* 8.1*  --   MG 2.9*  --  3.4*  --   --  3.0*  --   --   --   --   --   --   --   --   PHOS 4.1  --  3.8  --   --   --   --   --   --   --   --  3.5  --   --    < > = values in this interval not displayed.   GFR: Estimated Creatinine Clearance: 34.5 mL/min (A) (by C-G formula based on SCr of 1.91 mg/dL (H)). Recent Labs  Lab 12/04/18 0500  12/06/18 0545 12/07/18 0408 12/07/18 1400 12/08/18 0430 12/09/18 0445  PROCALCITON 0.15  --   --   --   --   --   --   WBC 7.1   < > 9.4 11.3*  --  12.3* 13.4*  LATICACIDVEN  --   --   --   --  0.4*  --   --    < > = values in this interval not displayed.    Liver Function Tests: Recent Labs  Lab 12/04/18 0500 12/05/18 0500 12/06/18 0545 12/07/18 0408 12/08/18 0430 12/09/18 0445  AST 22 20 15 20 20   --   ALT 36 30 24 23 21   --   ALKPHOS 64 60 72 71 63  --   BILITOT 0.4 0.4 0.1* 0.3 0.2*  --   PROT 6.5 6.9 6.8 6.3* 6.1*  --   ALBUMIN 2.3* 2.4* 2.4* 2.0* 1.8* 1.7*   No results for input(s): LIPASE, AMYLASE in the last 168 hours. No results for input(s): AMMONIA in the last 168 hours.  ABG    Component Value Date/Time   PHART 7.257 (L) 12/10/2018 1206   PCO2ART 83.2 (HH) 12/10/2018 1206   PO2ART 54.0 (L) 12/10/2018 1206   HCO3 36.9 (H) 12/10/2018 1206   TCO2 39 (H) 12/10/2018 1206   ACIDBASEDEF 1.0 12/07/2018 1432   O2SAT 79.0 12/10/2018 1206     Coagulation Profile: No results for input(s): INR, PROTIME in the last 168 hours.  Cardiac Enzymes: No results for input(s): CKTOTAL, CKMB, CKMBINDEX, TROPONINI in the last 168 hours.  HbA1C: Hemoglobin A1C   Date/Time Value Ref Range Status  05/15/2015 03:52 PM 5.5  Final  04/25/2014 04:43 PM 5.8  Final   Hgb A1c MFr Bld  Date/Time Value Ref Range Status  12/02/2018 08:38 AM 6.4 (H) 4.8 - 5.6 % Final    Comment:    (NOTE)         Prediabetes: 5.7 - 6.4         Diabetes: >6.4         Glycemic control for adults with diabetes: <7.0     CBG: Recent Labs  Lab 12/09/18 2018 12/09/18 2320 12/10/18 0335 12/10/18 0820 12/10/18 1202  GLUCAP 137* 129* 100* 127* 128*     Critical care time: 35 minutes       Roselie Awkward, MD [place] [facility] Pager: [phone] Cell: [phone] If no response, call [phone]

## 2018-12-10 NOTE — Progress Notes (Signed)
S/w dtr Laquisha; updated; dtr stated wanted to conduct video call at ~2100 tonight.  Called [facility] and was told best to have night RN call back but 2100 should be ok.    Oral sedation was dc/d and IV sedation restarted d/t respiratory rate of 40.  Plan is to turn off IV sedation in morning for awakening trial.

## 2018-12-10 NOTE — Progress Notes (Signed)
2100-Patients daughter and mother face timed with patient and both updated on patient.

## 2018-12-10 NOTE — Progress Notes (Addendum)
[place] TEAM 1 - Stepdown/ICU TEAM  Shelley Allison  WPY:[id number] DOB: 1955-08-15 DOA: 10/18/2018 PCP: Richarda Osmond, DO    Brief Narrative:  [phone] with a history of HTN and cerebral artery aneurysm rupture who presented to the ED 9/28 with 1 week of nausea vomiting and diarrhea followed by progressive shortness of breath with fever which began on 9/27.  She had attended a large wedding prior to the onset of her symptoms.  In the ED she was found to be hypoxic and confirmed to be SARS-CoV-2 positive.  Chest x-ray revealed bilateral infiltrates.  Significant Events: 9/21 approximate onset of GI symptoms 9/27 onset of pulmonary symptoms 9/28 admit  9/30 transfer to ICU 10/3 transfer back to PCU 10/16 TTE 10/17 transfer to ICU with respiratory distress/obtundation -intubation 10/22 small R pneumothorax -chest tube placed  COVID-19 specific Treatment: Remdesivir 9/28 >10/2 Solumedrol 9/28 > 10/7 Actemra 9/29 Convalescent plasma - patient declined Solumedrol 10/16 > 10/18 Remdesivir 10/18 > 10/22 Decadron 10/18 > 10/22  Subjective: No significant change in clinical status appreciable today.  Continuing attempts to wean sedation to evaluate underlying neurologic status.  Assessment & Plan:  COVID pneumonia - persisting acute hypoxic respiratory failure /acute hypercarbic respiratory failure - ?post-COVID lung fibrosis  CT angio chest negative for PE 10/3 - bilateral lower extremity venous duplex negative for DVT - completed full courses of Solu-Medrol, Remdesivir, and Actemra treatment during initial portion of hospital stay - was given a repeat course of remdesivir and Decadron -ventilator management per PCCM - expected prolonged course if recovery occurs   Small right-sided pneumothorax Apparent on CXR 10/22 - chest tube care per PCCM   Staph areus pneumonia Trach aspirate of 10/25 now growing Staph areus - abx initiated   Acute encephalopathy Patient was found in her  room 10/17 acutely altered with severe hypoxia -though her hypoxia improved with increased oxygen support her mental status did not and she required urgent intubation -an EEG revealed no evidence of seizure -CT head was unrevealing -presently sedated on ventilator -by reports at the time of her intubation the patient was responding to her name and able to follow simple commands  HTN Blood pressure controlled  Acute kidney injury Creatinine peaked at 2.14 -renal function is fluctuating, but creatinine may be climbing at this time -recheck in a.m.  Recent Labs  Lab 12/06/18 0545 12/07/18 0408 12/08/18 0430 12/09/18 0445 12/10/18 0440  CREATININE 1.72* 1.59* 1.43* 1.65* 1.91*    Uremia BUN markedly elevated - second course of steroid discontinued 10/22 - holding diuretic   Recent Labs  Lab 12/06/18 0545 12/07/18 0408 12/08/18 0430 12/09/18 0445 12/10/18 0440  BUN 119* 96* 93* 99* 116*    Hypernatremia Sodium slow to improve -continue free water support, though third space volume loss is becoming a significant issue  Transaminitis resolved - likely due to the virus itself +/- Remdesivir  History of ruptured cerebral artery aneurysm  DVT prophylaxis: Lovenox Code Status: FULL CODE Family Communication:  Disposition Plan: ICU  Consultants:  PCCM  Antimicrobials:  Ceftriaxone 9/28 > 10/1  Azithromycin 9/28 > 9/29 Cefazolin 10/26 >  Objective: Blood pressure (!) 104/57, pulse (!) 103, temperature 99.3 F (37.4 C), temperature source Axillary, resp. rate (!) 23, height 5\' 5"  (1.651 m), weight 95.4 kg, SpO2 92 %.  Intake/Output Summary (Last 24 hours) at 12/10/2018 1759 Last data filed at 12/10/2018 1300 Gross per 24 hour  Intake 1412.85 ml  Output 1130 ml  Net 282.85 ml  Filed Weights   12/08/18 0403 12/09/18 0500 12/10/18 0500  Weight: 96.2 kg 95.3 kg 95.4 kg    Examination: General: Sedate  Lungs: Fine crackles diffusely -no wheezing Cardiovascular:  RRR Abdomen: NT/ND, soft, BS+ Extremities: 2+ diffuse edema - edema of B conjunctiva   CBC: Recent Labs  Lab 12/04/18 0500 12/05/18 0500  12/06/18 0545 12/07/18 0408  12/08/18 0430  12/09/18 0411 12/09/18 0445 12/10/18 1206  WBC 7.1 7.9  --  9.4 11.3*  --  12.3*  --   --  13.4*  --   NEUTROABS 5.8 6.0  --  6.7  --   --   --   --   --   --   --   HGB 10.3* 10.2*   < > 10.6* 10.5*   < > 9.8*   < > 10.2* 9.5* 9.9*  HCT 34.8* 35.9*   < > 37.8 39.1   < > 36.0   < > 30.0* 35.1* 29.0*  MCV 94.6 96.8  --  100.0 103.2*  --  101.7*  --   --  102.3*  --   PLT 198 214  --  251 230  --  200  --   --  213  --    < > = values in this interval not displayed.   Basic Metabolic Panel: Recent Labs  Lab 12/03/18 1800  12/04/18 0500  12/07/18 0408  12/08/18 0430  12/09/18 0445 12/10/18 0440 12/10/18 1206  NA  --    < > 148*   < > 152*   < > 153*   < > 156* 157* 154*  K  --    < > 4.8   < > 5.7*   < > 5.6*   < > 5.0 5.0 5.2*  CL  --   --  109   < > 117*  --  119*  --  116* 116*  --   CO2  --   --  31   < > 32  --  29  --  34* 35*  --   GLUCOSE  --   --  142*   < > 201*  --  135*  --  131* 128*  --   BUN  --   --  88*   < > 96*  --  93*  --  99* 116*  --   CREATININE  --   --  1.88*   < > 1.59*  --  1.43*  --  1.65* 1.91*  --   CALCIUM  --   --  8.6*   < > 8.3*  --  8.1*  --  8.3* 8.1*  --   MG 2.9*  --  3.4*  --  3.0*  --   --   --   --   --   --   PHOS 4.1  --  3.8  --   --   --   --   --  3.5  --   --    < > = values in this interval not displayed.   GFR: Estimated Creatinine Clearance: 34.5 mL/min (A) (by C-G formula based on SCr of 1.91 mg/dL (H)).  Liver Function Tests: Recent Labs  Lab 12/05/18 0500 12/06/18 0545 12/07/18 0408 12/08/18 0430 12/09/18 0445  AST 20 15 20 20   --   ALT 30 24 23 21   --   ALKPHOS 60 72 71 63  --   BILITOT 0.4 0.1*  0.3 0.2*  --   PROT 6.9 6.8 6.3* 6.1*  --   ALBUMIN 2.4* 2.4* 2.0* 1.8* 1.7*    HbA1C: Hemoglobin A1C  Date/Time Value Ref Range  Status  05/15/2015 03:52 PM 5.5  Final  04/25/2014 04:43 PM 5.8  Final   Hgb A1c MFr Bld  Date/Time Value Ref Range Status  12/02/2018 08:38 AM 6.4 (H) 4.8 - 5.6 % Final    Comment:    (NOTE)         Prediabetes: 5.7 - 6.4         Diabetes: >6.4         Glycemic control for adults with diabetes: <7.0     Scheduled Meds: . chlorhexidine  15 mL Mouth/Throat BID  . Chlorhexidine Gluconate Cloth  6 each Topical Daily  . enoxaparin (LOVENOX) injection  40 mg Subcutaneous Q12H  . feeding supplement (PRO-STAT SUGAR FREE 64)  60 mL Per Tube TID  . free water  400 mL Per Tube Q4H  . insulin aspart  0-15 Units Subcutaneous Q4H  . mouth rinse  15 mL Mouth Rinse 10 times per day  . pantoprazole sodium  40 mg Per Tube Daily  . sodium chloride flush  10-40 mL Intracatheter Q12H     LOS: 28 days   Lonia BloodJeffrey T. , MD [facility] Office  [phone] Pager - Text Page per Amion  If 7PM-7AM, please contact night-coverage per Amion 12/10/2018, 5:59 PM

## 2018-12-10 NOTE — Progress Notes (Signed)
ETT lavaged with 10 cc normal saline solution.  Suctioned moderate amount of thick tan/yellow secretions from patient post lavage.  Patient tolerated well.

## 2018-12-11 LAB — RENAL FUNCTION PANEL
Albumin: <1 g/dL — ABNORMAL LOW (ref 3.5–5.0)
Anion gap: 8 (ref 5–15)
BUN: 104 mg/dL — ABNORMAL HIGH (ref 8–23)
CO2: 25 mmol/L (ref 22–32)
Calcium: 5.9 mg/dL — CL (ref 8.9–10.3)
Chloride: 116 mmol/L — ABNORMAL HIGH (ref 98–111)
Creatinine, Ser: 2.07 mg/dL — ABNORMAL HIGH (ref 0.44–1.00)
GFR calc Af Amer: 29 mL/min — ABNORMAL LOW (ref >60)
GFR calc non Af Amer: 25 mL/min — ABNORMAL LOW (ref >60)
Glucose, Bld: 123 mg/dL — ABNORMAL HIGH (ref 70–99)
Phosphorus: 3.3 mg/dL (ref 2.5–4.6)
Potassium: 4.2 mmol/L (ref 3.5–5.1)
Sodium: 149 mmol/L — ABNORMAL HIGH (ref 135–145)

## 2018-12-11 LAB — GLUCOSE, CAPILLARY
Glucose-Capillary: 118 mg/dL — ABNORMAL HIGH (ref 70–99)
Glucose-Capillary: 142 mg/dL — ABNORMAL HIGH (ref 70–99)
Glucose-Capillary: 130 mg/dL — ABNORMAL HIGH (ref 70–99)
Glucose-Capillary: 132 mg/dL — ABNORMAL HIGH (ref 70–99)
Glucose-Capillary: 114 mg/dL — ABNORMAL HIGH (ref 70–99)

## 2018-12-11 LAB — CBC
HCT: 27.6 % — ABNORMAL LOW (ref 36.0–46.0)
Hemoglobin: 7.6 g/dL — ABNORMAL LOW (ref 12.0–15.0)
MCH: 28.4 pg (ref 26.0–34.0)
MCHC: 27.5 g/dL — ABNORMAL LOW (ref 30.0–36.0)
MCV: 103 fL — ABNORMAL HIGH (ref 80.0–100.0)
Platelets: 192 10*3/uL (ref 150–400)
RBC: 2.68 MIL/uL — ABNORMAL LOW (ref 3.87–5.11)
RDW: 16.3 % — ABNORMAL HIGH (ref 11.5–15.5)
WBC: 11.7 10*3/uL — ABNORMAL HIGH (ref 4.0–10.5)
nRBC: 0.3 % — ABNORMAL HIGH (ref 0.0–0.2)

## 2018-12-11 MED ORDER — METOLAZONE 10 MG PO TABS
10.0000 mg | ORAL_TABLET | Freq: Once | ORAL | Status: DC
  Filled 2018-12-11: qty 1

## 2018-12-11 MED ORDER — FUROSEMIDE 10 MG/ML IJ SOLN
80.0000 mg | Freq: Once | INTRAMUSCULAR | Status: AC
  Administered 2018-12-11: 80 mg via INTRAVENOUS
  Filled 2018-12-11 (×2): qty 8

## 2018-12-11 MED ORDER — METOLAZONE 10 MG PO TABS
10.0000 mg | ORAL_TABLET | Freq: Once | ORAL | Status: AC
  Administered 2018-12-11: 10 mg via ORAL
  Filled 2018-12-11: qty 1

## 2018-12-11 MED ORDER — FUROSEMIDE 10 MG/ML IJ SOLN
40.0000 mg | Freq: Four times a day (QID) | INTRAMUSCULAR | Status: DC
  Administered 2018-12-11 (×2): 40 mg via INTRAVENOUS
  Filled 2018-12-11 (×2): qty 4

## 2018-12-11 MED ORDER — DEXMEDETOMIDINE HCL IN NACL 400 MCG/100ML IV SOLN
0.4000 ug/kg/h | INTRAVENOUS | Status: DC
  Administered 2018-12-11: 0.4 ug/kg/h via INTRAVENOUS
  Administered 2018-12-12: 02:00:00 0.8 ug/kg/h via INTRAVENOUS
  Filled 2018-12-11 (×2): qty 100

## 2018-12-11 MED ORDER — FUROSEMIDE 10 MG/ML IJ SOLN
60.0000 mg | Freq: Four times a day (QID) | INTRAMUSCULAR | Status: AC
  Administered 2018-12-12: 60 mg via INTRAVENOUS
  Filled 2018-12-11: qty 6

## 2018-12-11 NOTE — Progress Notes (Signed)
NAME:  Shelley Allison, MRN:  [id number], DOB:  06-20-1955, LOS: 29 ADMISSION DATE:  27-Nov-2018, CONSULTATION DATE:  10/16 REFERRING MD:  Elvera Lennox, CHIEF COMPLAINT:  Dyspnea   Brief History   63 year old female admitted on 11/27/2018 in the setting of worsening hypoxemic respiratory failure from COVID-19 pneumonia.  Past Medical History  HTN Cerebral artery aneurysm rupture  Significant Hospital Events   09/28 admit, start decadron/remdesivir 09/29 given tocilizumab in setting of worsening hypoxia; pt deferred option for convalescent plasma 09/30 transfer to ICU, PCCM consulted 10/03 transfer back to PCU 10/16 PCCM reconsulted for presistent hypoxia; restart solumedrol  10/17 transferred back to ICU with AMS and hypoxia, required intubation 10/18 restart remdesivir 10/22 left pneumothorax, chest tube placed  Consults:  PCCM  Procedures:  ETT 10/17 >> Rt radial a line 10/18 >>   Significant Diagnostic Tests:  Doppler b/l legs 10/01 >> no DVT CT angio chest 10/03 >> borderline LAN up to 1.5 cm, b/l patchy consolidation and GGO Echo 10/16 >> EF 70 to 75%, PAS 50 mmHg, RV size and function normal CT head 10/17 >> s/p Lt frontotemporal craniotomy, encephalomalacia Lt temporal lobe, chronic small posterior temporal cortical infarct, Lt aneurysm clipping EEG 10/19 >> mild general background slowing 10/24 bedside echo> dilated RV, LV grossly normal  Micro Data:  SARS COV2 9/28 >> POSITIVE Sputum 10/18 >> Urine 10/18 >> negative  Antimicrobials/COVID Rx:  Remdesivir 9/28 >> 10/02 Solumedrol 9/28 >> 10/07 Tocilizumab 9/29  Solumedrol 10/16 >> 10/18 Remdesivir 10/18 >>  Decadron 10/18 >>   Interim history/subjective:   Minimally responsive with sedation held Remains mechanically ventilated  Objective   Blood pressure 132/61, pulse (!) 106, temperature 98.5 F (36.9 C), temperature source Axillary, resp. rate (!) 38, height 5\' 5"  (1.651 m), weight 95.4 kg, SpO2 97  %. CVP:  [9 mmHg-25 mmHg] 10 mmHg  Vent Mode: PRVC FiO2 (%):  [70 %-80 %] 70 % Set Rate:  [35 bmp] 35 bmp Vt Set:  [450 mL] 450 mL PEEP:  [14 cmH20] 14 cmH20 Plateau Pressure:  [38 cmH20-47 cmH20] 40 cmH20   Intake/Output Summary (Last 24 hours) at 12/11/2018 1002 Last data filed at 12/11/2018 0910 Gross per 24 hour  Intake 1494.53 ml  Output 975 ml  Net 519.53 ml   Filed Weights   12/08/18 0403 12/09/18 0500 12/10/18 0500  Weight: 96.2 kg 95.3 kg 95.4 kg   Examination:  General:  Acute on chronically ill appearing female, NAD HENT: [place]/AT, PERRL, EOM-I and MMM, ETT in place PULM: Coarse diffusely CV: RRR, Nl S1/S2 and -M/R/G GI: Soft, NT, ND and +BS MSK: normal bulk and tone Neuro: Minimal response to external stimuli, no eye opening or response to pain for me  I reviewed CXR myself, infiltrate noted and ETT is in a good position  Resolved Hospital Problem list     Assessment & Plan:  Acute hypoxemic/hypercapnic respiratory failure in setting of COVID-19 pneumonia Concern for fibrotic change of lung post COVID 10/21-25: Worsening respiratory acidosis, worsening dead space, worsening lung compliance, peak and plateau elevated 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 Continue mechanical ventilation per ARDS protocol Target TVol 6-8cc/kgIBW Target Plateau Pressure < 30cm H20 Target driving pressure less than 15 cm of water Target PaO2 55-65: titrate PEEP/FiO2 per protocol As long as PaO2 to FiO2 ratio is less than 1:150 position in prone position for 16 hours a day Check CVP daily if CVL in place Target CVP less than 4, diurese as  necessary Ventilator associated pneumonia prevention protocol Proning per protocol Titrate O2 for sat of 85% Lasix and zaroxolyn  Shock/hypotension: mild, sedation related D/C propofol Continue levophed Active diureses  Pneumothorax Some serous drainage around site Continue chest tube to water seal Repeat CXR in AM   Acute metabolic encephalopathy, was able to talk just prior to intubation so suspect most of her encephalopathy now is sedation related Severe ventilator dyssynchrony Hold all oral sedatives RASS target -1 to -2 during most of daytime WUA tomorrow morning Would be cautious labeling her as anoxic brain injury as she was speaking prior to intubation D/C propofol Precedex ordered  AKI: improving, with hypernatremia Continue free water Monitor BMET and UOP Replace electrolytes as needed  Best practice:  Diet: tube feeding Pain/Anxiety/Delirium protocol (if indicated): yes, RASS target -1 to -2 VAP protocol (if indicated): yes DVT prophylaxis: lovenox per protocol GI prophylaxis: pantoprazole Glucose control: per TRH Mobility: bed rest Code Status: full Family Communication: per [facility] Disposition: remain in ICU  Labs   CBC: Recent Labs  Lab 12/05/18 0500  12/06/18 0545 12/07/18 0408  12/08/18 0430  12/08/18 1447 12/09/18 0411 12/09/18 0445 12/10/18 1206 12/11/18 0435  WBC 7.9  --  9.4 11.3*  --  12.3*  --   --   --  13.4*  --  11.7*  NEUTROABS 6.0  --  6.7  --   --   --   --   --   --   --   --   --   HGB 10.2*   < > 10.6* 10.5*   < > 9.8*   < > 9.5* 10.2* 9.5* 9.9* 7.6*  HCT 35.9*   < > 37.8 39.1   < > 36.0   < > 28.0* 30.0* 35.1* 29.0* 27.6*  MCV 96.8  --  100.0 103.2*  --  101.7*  --   --   --  102.3*  --  103.0*  PLT 214  --  251 230  --  200  --   --   --  213  --  192   < > = values in this interval not displayed.    Basic Metabolic Panel: Recent Labs  Lab 12/07/18 0408  12/08/18 0430  12/09/18 0411 12/09/18 0445 12/10/18 0440 12/10/18 1206 12/11/18 0435  NA 152*   < > 153*   < > 156* 156* 157* 154* 149*  K 5.7*   < > 5.6*   < > 4.9 5.0 5.0 5.2* 4.2  CL 117*  --  119*  --   --  116* 116*  --  116*  CO2 32  --  29  --   --  34* 35*  --  25  GLUCOSE 201*  --  135*  --   --  131* 128*  --  123*  BUN 96*  --  93*  --   --  99* 116*  --  104*  CREATININE  1.59*  --  1.43*  --   --  1.65* 1.91*  --  2.07*  CALCIUM 8.3*  --  8.1*  --   --  8.3* 8.1*  --  5.9*  MG 3.0*  --   --   --   --   --   --   --   --   PHOS  --   --   --   --   --  3.5  --   --  3.3   < > =  values in this interval not displayed.   GFR: Estimated Creatinine Clearance: 31.8 mL/min (A) (by C-G formula based on SCr of 2.07 mg/dL (H)). Recent Labs  Lab 12/07/18 0408 12/07/18 1400 12/08/18 0430 12/09/18 0445 12/11/18 0435  WBC 11.3*  --  12.3* 13.4* 11.7*  LATICACIDVEN  --  0.4*  --   --   --     Liver Function Tests: Recent Labs  Lab 12/05/18 0500 12/06/18 0545 12/07/18 0408 12/08/18 0430 12/09/18 0445 12/11/18 0435  AST 20 15 20 20   --   --   ALT 30 24 23 21   --   --   ALKPHOS 60 72 71 63  --   --   BILITOT 0.4 0.1* 0.3 0.2*  --   --   PROT 6.9 6.8 6.3* 6.1*  --   --   ALBUMIN 2.4* 2.4* 2.0* 1.8* 1.7* <1.0*   No results for input(s): LIPASE, AMYLASE in the last 168 hours. No results for input(s): AMMONIA in the last 168 hours.  ABG    Component Value Date/Time   PHART 7.257 (L) 12/10/2018 1206   PCO2ART 83.2 (HH) 12/10/2018 1206   PO2ART 54.0 (L) 12/10/2018 1206   HCO3 36.9 (H) 12/10/2018 1206   TCO2 39 (H) 12/10/2018 1206   ACIDBASEDEF 1.0 12/07/2018 1432   O2SAT 79.0 12/10/2018 1206     Coagulation Profile: No results for input(s): INR, PROTIME in the last 168 hours.  Cardiac Enzymes: No results for input(s): CKTOTAL, CKMB, CKMBINDEX, TROPONINI in the last 168 hours.  HbA1C: Hemoglobin A1C  Date/Time Value Ref Range Status  05/15/2015 03:52 PM 5.5  Final  04/25/2014 04:43 PM 5.8  Final   Hgb A1c MFr Bld  Date/Time Value Ref Range Status  12/02/2018 08:38 AM 6.4 (H) 4.8 - 5.6 % Final    Comment:    (NOTE)         Prediabetes: 5.7 - 6.4         Diabetes: >6.4         Glycemic control for adults with diabetes: <7.0     CBG: Recent Labs  Lab 12/10/18 1655 12/10/18 2024 12/10/18 2337 12/11/18 0358 12/11/18 0834  GLUCAP 106*  151* 118* 118* 142*   The patient is critically ill with multiple organ systems failure and requires high complexity decision making for assessment and support, frequent evaluation and titration of therapies, application of advanced monitoring technologies and extensive interpretation of multiple databases.   Critical Care Time devoted to patient care services described in this note is  33  Minutes. This time reflects time of care of this signee Dr Koren BoundWesam Yacoub. This critical care time does not reflect procedure time, or teaching time or supervisory time of PA/NP/Med student/Med Resident etc but could involve care discussion time.  Alyson ReedyWesam G. Yacoub, M.D. [facility].

## 2018-12-11 NOTE — Progress Notes (Signed)
[place] TEAM 1 - Stepdown/ICU TEAM  Irven Coenita P Zeidan  ZOX:[id number] DOB: 15-Nov-1955 DOA: 10/16/2018 PCP: Leeroy BockAnderson, Chelsey L, DO    Brief Narrative:  [phone] with a history of HTN and cerebral artery aneurysm rupture who presented to the ED 9/28 with 1 week of nausea vomiting and diarrhea followed by progressive shortness of breath with fever which began on 9/27.  She had attended a large wedding prior to the onset of her symptoms.  In the ED she was found to be hypoxic and confirmed to be SARS-CoV-2 positive.  Chest x-ray revealed bilateral infiltrates.  Significant Events: 9/21 approximate onset of GI symptoms 9/27 onset of pulmonary symptoms 9/28 admit  9/30 transfer to ICU 10/3 transfer back to PCU 10/16 TTE 10/17 transfer to ICU with respiratory distress/obtundation -intubation 10/22 small R pneumothorax -chest tube placed  COVID-19 specific Treatment: Remdesivir 9/28 >10/2 Solumedrol 9/28 > 10/7 Actemra 9/29 Convalescent plasma - patient declined Solumedrol 10/16 > 10/18 Remdesivir 10/18 > 10/22 Decadron 10/18 > 10/22  Subjective: At time of rounding patient has been off IV sedatives for approximately 3 hours.  She remains unresponsive.  There is significant third space volume loss.  Assessment & Plan:  COVID pneumonia - persisting acute hypoxic respiratory failure /acute hypercarbic respiratory failure - ?post-COVID lung fibrosis  CT angio chest negative for PE 10/3 - bilateral lower extremity venous duplex negative for DVT - completed full courses of Solu-Medrol, Remdesivir, and Actemra treatment during initial portion of hospital stay - was given a repeat course of remdesivir and Decadron -ventilator management per PCCM - expected prolonged course if recovery occurs   Small right-sided pneumothorax Apparent on CXR 10/22 - chest tube care per PCCM -has been stable on follow-up CXR  Staph areus pneumonia Trach aspirate of 10/25 growing Staph areus -continue antibiotic  coverage  Acute encephalopathy Patient was found in her room 10/17 acutely altered with severe hypoxia -though her hypoxia improved with increased oxygen support her mental status did not and she required urgent intubation -an EEG revealed no evidence of seizure -CT head was unrevealing -presently sedated on ventilator -by reports at the time of her intubation the patient was responding to her name and able to follow simple commands -at goal at this time is to discontinue oral sedation and withhold IV sedation as long as possible to determine if she will wake up  Worseining macrocytic anemia  Folic acid is low -replacement ordered -follow hemoglobin trend -may soon require transfusion  HTN Blood pressure controlled  Acute kidney injury Creatinine peaked at 2.14 -creatinine is again climbing - with significant third space/total body overload and climbing BUN she may soon need to be considered for CRRT  Recent Labs  Lab 12/07/18 0408 12/08/18 0430 12/09/18 0445 12/10/18 0440 12/11/18 0435  CREATININE 1.59* 1.43* 1.65* 1.91* 2.07*    Uremia BUN markedly elevated - second course of steroid discontinued 10/22 - may have to consider CRRT to allow us to accurately assess her mental status   Recent Labs  Lab 12/07/18 0408 12/08/18 0430 12/09/18 0445 12/10/18 0440 12/11/18 0435  BUN 96* 93* 99* 116* 104*    Hypernatremia Sodium slow to improve -continue free water support, though third space volume loss is becoming a significant issue -attempt to balance this with diuresis as well  Transaminitis resolved - likely due to the virus itself +/- Remdesivir  History of ruptured cerebral artery aneurysm  DVT prophylaxis: Lovenox Code Status: FULL CODE Family Communication:  Disposition Plan: ICU  Consultants:  PCCM  Antimicrobials:  Ceftriaxone 9/28 > 10/1  Azithromycin 9/28 > 9/29 Cefazolin 10/26 >  Objective: Blood pressure 136/64, pulse (!) 105, temperature 99.7 F (37.6  C), temperature source Oral, resp. rate (!) 35, height 5\' 5"  (1.651 m), weight 95.4 kg, SpO2 99 %.  Intake/Output Summary (Last 24 hours) at 12/11/2018 0852 Last data filed at 12/11/2018 0615 Gross per 24 hour  Intake 1488.78 ml  Output 1075 ml  Net 413.78 ml   Filed Weights   12/08/18 0403 12/09/18 0500 12/10/18 0500  Weight: 96.2 kg 95.3 kg 95.4 kg    Examination: General: Sedate -on vent Lungs: Fine crackles diffusely without change Cardiovascular: RRR without appreciable rub Abdomen: NT/ND, soft, BS+ Extremities: 2+ diffuse edema - edema of B conjunctiva without change  CBC: Recent Labs  Lab 12/05/18 0500  12/06/18 0545  12/08/18 0430  12/09/18 0445 12/10/18 1206 12/11/18 0435  WBC 7.9  --  9.4   < > 12.3*  --  13.4*  --  11.7*  NEUTROABS 6.0  --  6.7  --   --   --   --   --   --   HGB 10.2*   < > 10.6*   < > 9.8*   < > 9.5* 9.9* 7.6*  HCT 35.9*   < > 37.8   < > 36.0   < > 35.1* 29.0* 27.6*  MCV 96.8  --  100.0   < > 101.7*  --  102.3*  --  103.0*  PLT 214  --  251   < > 200  --  213  --  192   < > = values in this interval not displayed.   Basic Metabolic Panel: Recent Labs  Lab 12/07/18 0408  12/09/18 0445 12/10/18 0440 12/10/18 1206 12/11/18 0435  NA 152*   < > 156* 157* 154* 149*  K 5.7*   < > 5.0 5.0 5.2* 4.2  CL 117*   < > 116* 116*  --  116*  CO2 32   < > 34* 35*  --  25  GLUCOSE 201*   < > 131* 128*  --  123*  BUN 96*   < > 99* 116*  --  104*  CREATININE 1.59*   < > 1.65* 1.91*  --  2.07*  CALCIUM 8.3*   < > 8.3* 8.1*  --  5.9*  MG 3.0*  --   --   --   --   --   PHOS  --   --  3.5  --   --  3.3   < > = values in this interval not displayed.   GFR: Estimated Creatinine Clearance: 31.8 mL/min (A) (by C-G formula based on SCr of 2.07 mg/dL (H)).  Liver Function Tests: Recent Labs  Lab 12/05/18 0500 12/06/18 0545 12/07/18 0408 12/08/18 0430 12/09/18 0445 12/11/18 0435  AST 20 15 20 20   --   --   ALT 30 24 23 21   --   --   ALKPHOS 60 72  71 63  --   --   BILITOT 0.4 0.1* 0.3 0.2*  --   --   PROT 6.9 6.8 6.3* 6.1*  --   --   ALBUMIN 2.4* 2.4* 2.0* 1.8* 1.7* <1.0*    HbA1C: Hemoglobin A1C  Date/Time Value Ref Range Status  05/15/2015 03:52 PM 5.5  Final  04/25/2014 04:43 PM 5.8  Final   Hgb A1c MFr Bld  Date/Time Value Ref Range  Status  12/02/2018 08:38 AM 6.4 (H) 4.8 - 5.6 % Final    Comment:    (NOTE)         Prediabetes: 5.7 - 6.4         Diabetes: >6.4         Glycemic control for adults with diabetes: <7.0     Scheduled Meds: . chlorhexidine  15 mL Mouth/Throat BID  . Chlorhexidine Gluconate Cloth  6 each Topical Daily  . enoxaparin (LOVENOX) injection  40 mg Subcutaneous Q12H  . feeding supplement (PRO-STAT SUGAR FREE 64)  60 mL Per Tube TID  . [START ON 82/42/3536] folic acid  1 mg Per Tube Daily  . folic acid  1 mg Intravenous Daily  . free water  400 mL Per Tube Q3H  . insulin aspart  0-15 Units Subcutaneous Q4H  . mouth rinse  15 mL Mouth Rinse 10 times per day  . pantoprazole sodium  40 mg Per Tube Daily  . sodium chloride flush  10-40 mL Intracatheter Q12H     LOS: 29 days   Cherene Altes, MD [facility] Office  [phone] Pager - Text Page per Amion  If 7PM-7AM, please contact night-coverage per Amion 12/11/2018, 8:52 AM

## 2018-12-11 NOTE — Progress Notes (Signed)
Lavaged pt with 10cc saline flush per CCM MD order. Pt stable throughout with no complications. VS within normal limits.

## 2018-12-11 NOTE — Progress Notes (Signed)
Pt lavaged and suctioned with 10cc saline at 0045.  Moderate amt of thick tan secretions obtained.

## 2018-12-11 NOTE — Progress Notes (Addendum)
Critical lab result called by Jearld Pies. Calcium  5.9. Notified MD .

## 2018-12-11 NOTE — Progress Notes (Signed)
Providers note says that goal is to hold sedation as long as possible to see if pt will wake up. Fentanyl has been stopped since 0619 am according to pump.it's currently 2300 and the pt is still not awake and she is breathing over 44 times per minutes. PRN versed and precedex has been given, but she is still RR:44 and pt looks uncomfortable,  like she is working hard. So fentanyl drip has been restarted

## 2018-12-12 ENCOUNTER — Inpatient Hospital Stay (HOSPITAL_COMMUNITY): Payer: BC Managed Care – PPO

## 2018-12-12 DIAGNOSIS — Z7189 Other specified counseling: Secondary | ICD-10-CM

## 2018-12-12 LAB — CBC
HCT: 30.9 % — ABNORMAL LOW (ref 36.0–46.0)
Hemoglobin: 8.4 g/dL — ABNORMAL LOW (ref 12.0–15.0)
MCH: 27.5 pg (ref 26.0–34.0)
MCHC: 27.2 g/dL — ABNORMAL LOW (ref 30.0–36.0)
MCV: 101 fL — ABNORMAL HIGH (ref 80.0–100.0)
Platelets: 212 10*3/uL (ref 150–400)
RBC: 3.06 MIL/uL — ABNORMAL LOW (ref 3.87–5.11)
RDW: 15.9 % — ABNORMAL HIGH (ref 11.5–15.5)
WBC: 14.3 10*3/uL — ABNORMAL HIGH (ref 4.0–10.5)
nRBC: 0.2 % (ref 0.0–0.2)

## 2018-12-12 LAB — AMMONIA: Ammonia: 82 umol/L — ABNORMAL HIGH (ref 9–35)

## 2018-12-12 LAB — BASIC METABOLIC PANEL
Anion gap: 10 (ref 5–15)
BUN: 175 mg/dL — ABNORMAL HIGH (ref 8–23)
CO2: 32 mmol/L (ref 22–32)
Calcium: 7.6 mg/dL — ABNORMAL LOW (ref 8.9–10.3)
Chloride: 102 mmol/L (ref 98–111)
Creatinine, Ser: 3.29 mg/dL — ABNORMAL HIGH (ref 0.44–1.00)
GFR calc Af Amer: 16 mL/min — ABNORMAL LOW (ref >60)
GFR calc non Af Amer: 14 mL/min — ABNORMAL LOW (ref >60)
Glucose, Bld: 126 mg/dL — ABNORMAL HIGH (ref 70–99)
Potassium: 5.2 mmol/L — ABNORMAL HIGH (ref 3.5–5.1)
Sodium: 144 mmol/L (ref 135–145)

## 2018-12-12 LAB — MAGNESIUM: Magnesium: 3.2 mg/dL — ABNORMAL HIGH (ref 1.7–2.4)

## 2018-12-12 LAB — CORTISOL: Cortisol, Plasma: 27.2 ug/dL

## 2018-12-12 LAB — HEPATIC FUNCTION PANEL
ALT: 17 U/L (ref 0–44)
AST: 47 U/L — ABNORMAL HIGH (ref 15–41)
Albumin: 1.5 g/dL — ABNORMAL LOW (ref 3.5–5.0)
Alkaline Phosphatase: 63 U/L (ref 38–126)
Bilirubin, Direct: <0.1 mg/dL (ref 0.0–0.2)
Total Bilirubin: 0.1 mg/dL — ABNORMAL LOW (ref 0.3–1.2)
Total Protein: 6.5 g/dL (ref 6.5–8.1)

## 2018-12-12 LAB — CALCIUM, IONIZED: Calcium, Ionized, Serum: 4.3 mg/dL — ABNORMAL LOW (ref 4.5–5.6)

## 2018-12-12 LAB — PHOSPHORUS: Phosphorus: 5.7 mg/dL — ABNORMAL HIGH (ref 2.5–4.6)

## 2018-12-12 LAB — GLUCOSE, CAPILLARY
Glucose-Capillary: 107 mg/dL — ABNORMAL HIGH (ref 70–99)
Glucose-Capillary: 112 mg/dL — ABNORMAL HIGH (ref 70–99)
Glucose-Capillary: 131 mg/dL — ABNORMAL HIGH (ref 70–99)
Glucose-Capillary: 140 mg/dL — ABNORMAL HIGH (ref 70–99)
Glucose-Capillary: 149 mg/dL — ABNORMAL HIGH (ref 70–99)
Glucose-Capillary: 155 mg/dL — ABNORMAL HIGH (ref 70–99)

## 2018-12-12 MED ORDER — LACTULOSE 10 GM/15ML PO SOLN
30.0000 g | Freq: Four times a day (QID) | ORAL | Status: DC
  Administered 2018-12-12 – 2018-12-13 (×3): 30 g
  Filled 2018-12-12 (×3): qty 45

## 2018-12-12 MED ORDER — FUROSEMIDE 10 MG/ML IJ SOLN
10.0000 mg/h | INTRAVENOUS | Status: AC
  Administered 2018-12-12: 10 mg/h via INTRAVENOUS
  Filled 2018-12-12: qty 25

## 2018-12-12 MED ORDER — METOLAZONE 10 MG PO TABS
10.0000 mg | ORAL_TABLET | Freq: Once | ORAL | Status: AC
  Administered 2018-12-12: 10 mg via ORAL
  Filled 2018-12-12: qty 1

## 2018-12-12 MED ORDER — ALBUMIN HUMAN 25 % IV SOLN
25.0000 g | Freq: Four times a day (QID) | INTRAVENOUS | Status: AC
  Administered 2018-12-12 – 2018-12-13 (×4): 25 g via INTRAVENOUS
  Filled 2018-12-12 (×2): qty 100
  Filled 2018-12-12 (×3): qty 50

## 2018-12-12 MED ORDER — SODIUM ZIRCONIUM CYCLOSILICATE 10 G PO PACK
10.0000 g | PACK | Freq: Once | ORAL | Status: AC
  Administered 2018-12-12: 10:00:00 10 g via ORAL
  Filled 2018-12-12: qty 1

## 2018-12-12 MED ORDER — HEPARIN SODIUM (PORCINE) 10000 UNIT/ML IJ SOLN
10000.0000 [IU] | Freq: Three times a day (TID) | INTRAMUSCULAR | Status: DC
  Administered 2018-12-12 – 2018-12-13 (×3): 10000 [IU] via SUBCUTANEOUS
  Filled 2018-12-12 (×3): qty 1

## 2018-12-12 MED ORDER — HYDROCORTISONE NA SUCCINATE PF 100 MG IJ SOLR
50.0000 mg | Freq: Four times a day (QID) | INTRAMUSCULAR | Status: DC
  Administered 2018-12-12 – 2018-12-13 (×5): 50 mg via INTRAVENOUS
  Filled 2018-12-12 (×5): qty 2

## 2018-12-12 NOTE — Progress Notes (Signed)
NAME:  Shelley Allison, MRN:  [id number], DOB:  1955/03/29, LOS: 30 ADMISSION DATE:  06-16-18, CONSULTATION DATE:  10/16 REFERRING MD:  [name], CHIEF COMPLAINT:  Dyspnea   Brief History   63 year old female admitted on November 12, 2018 in the setting of worsening hypoxemic respiratory failure from COVID-19 pneumonia.  Past Medical History  HTN Cerebral artery aneurysm rupture  Significant Hospital Events   09/28 admit, start decadron/remdesivir 09/29 given tocilizumab in setting of worsening hypoxia; pt deferred option for convalescent plasma 09/30 transfer to ICU, PCCM consulted 10/03 transfer back to PCU 10/16 PCCM reconsulted for presistent hypoxia; restart solumedrol  10/17 transferred back to ICU with AMS and hypoxia, required intubation 10/18 restart remdesivir 10/22 left pneumothorax, chest tube placed  Consults:  PCCM  Procedures:  ETT 10/17 >> Rt radial a line 10/18 >>   Significant Diagnostic Tests:  Doppler b/l legs 10/01 >> no DVT CT angio chest 10/03 >> borderline LAN up to 1.5 cm, b/l patchy consolidation and GGO Echo 10/16 >> EF 70 to 75%, PAS 50 mmHg, RV size and function normal CT head 10/17 >> s/p Lt frontotemporal craniotomy, encephalomalacia Lt temporal lobe, chronic small posterior temporal cortical infarct, Lt aneurysm clipping EEG 10/19 >> mild general background slowing 10/24 bedside echo> dilated RV, LV grossly normal  Micro Data:  SARS COV2 9/28 >> POSITIVE Sputum 10/18 >> Urine 10/18 >> negative  Antimicrobials/COVID Rx:  Remdesivir 9/28 >> 10/02 Solumedrol 9/28 >> 10/07 Tocilizumab 9/29  Solumedrol 10/16 >> 10/18 Remdesivir 10/18 >>  Decadron 10/18 >>   Interim history/subjective:   Sedation held overnight but evidently at 11 PM the patient became tachypneic and uncomfortable and fentanyl drip was restarted.  Unresponsive again this AM No further events overnight  Objective   Blood pressure (!) 91/50, pulse 91, temperature 100  F (37.8 C), temperature source Oral, resp. rate (!) 43, height 5\' 5"  (1.651 m), weight 95.4 kg, SpO2 97 %. CVP:  [9 mmHg-34 mmHg] 18 mmHg  Vent Mode: PRVC FiO2 (%):  [70 %] 70 % Set Rate:  [35 bmp] 35 bmp Vt Set:  [450 mL] 450 mL PEEP:  [12 cmH20] 12 cmH20 Plateau Pressure:  [32 cmH20-43 cmH20] 43 cmH20   Intake/Output Summary (Last 24 hours) at 12/12/2018 0910 Last data filed at 12/12/2018 0600 Gross per 24 hour  Intake 3094.64 ml  Output 2000 ml  Net 1094.64 ml   Filed Weights   12/08/18 0403 12/09/18 0500 12/10/18 0500  Weight: 96.2 kg 95.3 kg 95.4 kg   Examination:  General:  Acute on chronically ill appearing female, NAD HENT: [place]/AT, PERRL, EOM-I and MMM, ETT in place PULM: Coarse diffusely CV: RRR, Nl S1/S2 and -M/R/G GI: Soft, NT, ND and +BS MSK: normal bulk and tone Neuro: Opens eyes but not following commands  I reviewed CXR myself, ETT is in a good position and infiltrate noted.  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 Continue full vent support Target TVol 6-8cc/kgIBW Target Plateau Pressure < 30cm H20 Target driving pressure less than 15 cm of water Target PaO2 55-65: titrate PEEP/FiO2 per protocol As long as PaO2 to FiO2 ratio is less than 1:150 position in prone position for 16 hours a day, will  continue proning Check CVP via PICC Target CVP less than 4, diurese as necessary Ventilator associated pneumonia prevention protocol Proning per protocol Titrate O2 for sat of 85% Lasix drip at 10 mg/hr and zaroxolyn 10 mg x1 BMET in AM Replace electrolytes as indicated Accept permissive hypercapnia over barotrauma at this  point  Shock/hypotension: mild, sedation related Continue levophed Active diureses as above Add stress dose steroids  Pneumothorax Some serous drainage around site Continue chest tube to water seal Repeat CXR in AM  Acute metabolic encephalopathy, was able to talk just prior to intubation so suspect most of her encephalopathy now is sedation related Severe ventilator dyssynchrony Hold all oral sedatives RASS target -1 to -2 during most of daytime WUA tomorrow morning Would be cautious labeling her as anoxic brain injury as she was speaking prior to intubation Precedex ordered  AKI: improving, with hypernatremia Continue free water Monitor BMET and UOP Replace electrolytes as needed See diureses orders above If above is ineffective will consult renal in AM  I had an extensive conversation with the patient's daughter.  The patient had stated to her daughter that if she will not get better and be free from machines that she would not want this level of care.  We discussed further and was able to understand that her intention was to be able to go home free from the ventilator within 10 days.  I informed her that we are already well beyond that but she wanted to feel that every option has been exhausted.  We discussed the MODS situation and that the only other possibility is CRRT to remove fluid for a one way extubation and those were her wishes as she does not believe her mother would want a trach/peg, prolonged recovery ...etc.  We also discussed code status and decision was made to proceed with LCB with no CPR/cardioversion.  Pressors only.  If after dialysis it is felt that the patient will not make a full recovery to go home within 10 days then proceed with comfort care.  Best practice:  Diet: tube feeding Pain/Anxiety/Delirium protocol (if indicated): yes, RASS target -1 to -2 VAP protocol (if indicated): yes DVT prophylaxis: lovenox per protocol GI prophylaxis: pantoprazole  Glucose control: per [facility] Mobility: bed rest Code Status: full Family Communication: per [facility] Disposition: remain in ICU  Labs   CBC: Recent Labs  Lab 12/06/18 0545 12/07/18 0408  12/08/18 0430  12/09/18 0411 12/09/18 0445 12/10/18 1206 12/11/18 0435 12/12/18 0649  WBC 9.4 11.3*  --  12.3*  --   --  13.4*  --  11.7* 14.3*  NEUTROABS 6.7  --   --   --   --   --   --   --   --   --   HGB 10.6* 10.5*   < > 9.8*   < > 10.2* 9.5* 9.9* 7.6* 8.4*  HCT 37.8 39.1   < > 36.0   < > 30.0* 35.1* 29.0* 27.6* 30.9*  MCV 100.0 103.2*  --  101.7*  --   --  102.3*  --  103.0* 101.0*  PLT 251 230  --  200  --   --  213  --  192 212   < > = values in this interval not displayed.    Basic Metabolic Panel: Recent Labs  Lab 12/07/18 0408  12/08/18 0430  12/09/18 0445 12/10/18 0440 12/10/18 1206 12/11/18 0435 12/12/18 0649  NA 152*   < > 153*   < >  156* 157* 154* 149* 144  K 5.7*   < > 5.6*   < > 5.0 5.0 5.2* 4.2 5.2*  CL 117*  --  119*  --  116* 116*  --  116* 102  CO2 32  --  29  --  34* 35*  --  25 32  GLUCOSE 201*  --  135*  --  131* 128*  --  123* 126*  BUN 96*  --  93*  --  99* 116*  --  104* 175*  CREATININE 1.59*  --  1.43*  --  1.65* 1.91*  --  2.07* 3.29*  CALCIUM 8.3*  --  8.1*  --  8.3* 8.1*  --  5.9* 7.6*  MG 3.0*  --   --   --   --   --   --   --  3.2*  PHOS  --   --   --   --  3.5  --   --  3.3 5.7*   < > = values in this interval not displayed.   GFR: Estimated Creatinine Clearance: 20 mL/min (A) (by C-G formula based on SCr of 3.29 mg/dL (H)). Recent Labs  Lab 12/07/18 1400 12/08/18 0430 12/09/18 0445 12/11/18 0435 12/12/18 0649  WBC  --  12.3* 13.4* 11.7* 14.3*  LATICACIDVEN 0.4*  --   --   --   --     Liver Function Tests: Recent Labs  Lab 12/06/18 0545 12/07/18 0408 12/08/18 0430 12/09/18 0445 12/11/18 0435 12/12/18 0649  AST --   --  47*  ALT --   --  17  ALKPHOS 72 71 63  --   --  63  BILITOT 0.1* 0.3 0.2*  --   --  0.1*   PROT 6.8 6.3* 6.1*  --   --  6.5  ALBUMIN 2.4* 2.0* 1.8* 1.7* <1.0* 1.5*   No results for input(s): LIPASE, AMYLASE in the last 168 hours. Recent Labs  Lab 12/12/18 0647  AMMONIA 82*    ABG    Component Value Date/Time   PHART 7.257 (L) 12/10/2018 1206   PCO2ART 83.2 (HH) 12/10/2018 1206   PO2ART 54.0 (L) 12/10/2018 1206   HCO3 36.9 (H) 12/10/2018 1206   TCO2 39 (H) 12/10/2018 1206   ACIDBASEDEF 1.0 12/07/2018 1432   O2SAT 79.0 12/10/2018 1206     Coagulation Profile: No results for input(s): INR, PROTIME in the last 168 hours.  Cardiac Enzymes: No results for input(s): CKTOTAL, CKMB, CKMBINDEX, TROPONINI in the last 168 hours.  HbA1C: Hemoglobin A1C  Date/Time Value Ref Range Status  05/15/2015 03:52 PM 5.5  Final  04/25/2014 04:43 PM 5.8  Final   Hgb A1c MFr Bld  Date/Time Value Ref Range Status  12/02/2018 08:38 AM 6.4 (H) 4.8 - 5.6 % Final    Comment:    (NOTE)         Prediabetes: 5.7 - 6.4         Diabetes: >6.4         Glycemic control for adults with diabetes: <7.0     CBG: Recent Labs  Lab 12/11/18 1303 12/11/18 1730 12/11/18 1954 12/12/18 0005 12/12/18 0806  GLUCAP 130* 132* 114* 155* 107*   The patient is critically ill with multiple organ systems failure and requires high complexity decision making for assessment and support, frequent evaluation and titration of therapies, application of advanced monitoring technologies and extensive interpretation of multiple databases.  Critical Care Time devoted to patient care services described in this note is  34  Minutes. This time reflects time of care of this signee Dr Jennet Maduro. This critical care time does not reflect procedure time, or teaching time or supervisory time of PA/NP/Med student/Med Resident etc but could involve care discussion time.  Rush Farmer, M.D. [facility]/Critical Care Medicine.

## 2018-12-12 NOTE — Progress Notes (Signed)
PROGRESS NOTE  Shelley Allison Chestnut ZOX:[id number] CoeRN:[id number] DOB: 27-Jan-1956 DOA: 2018-08-19  PCP: Leeroy BockAnderson, Chelsey L, DO  Brief History/Interval Summary: 81XB: 63yo with a history of HTN and cerebral artery aneurysm rupture who presented to the ED 9/28 with 1 week of nausea vomiting and diarrhea followed by progressive shortness of breath with fever which began on 9/27.  She had attended a large wedding prior to the onset of her symptoms.  In the ED she was found to be hypoxic and confirmed to be SARS-CoV-2 positive.  Chest x-ray revealed bilateral infiltrates.  Reason for Visit: Pneumonia due to COVID-19.  Acute respiratory failure with hypoxia  Consultants: Pulmonology  Procedures:  Transthoracic echocardiogram  1. Left ventricular ejection fraction, by visual estimation, is 70 to 75%. The left ventricle has hyperdynamic function. Normal left ventricular size. There is mildly increased left ventricular hypertrophy.  2. Left ventricular diastolic Doppler parameters are consistent with impaired relaxation pattern of LV diastolic filling.  3. Global right ventricle has normal systolic function.The right ventricular size is normal. No increase in right ventricular wall thickness.  4. Left atrial size was normal.  5. Right atrial size was normal.  6. The mitral valve is grossly normal. Trace mitral valve regurgitation.  7. The tricuspid valve is not well visualized. Tricuspid valve regurgitation was not assessed by color flow Doppler.  8. The aortic valve is grossly normal Aortic valve regurgitation was not visualized by color flow Doppler. Mild aortic valve sclerosis without stenosis.  9. The pulmonic valve was grossly normal. Pulmonic valve regurgitation is not visualized by color flow Doppler. 10. The inferior vena cava is normal in size with greater than 50% respiratory variability, suggesting right atrial pressure of 3 mmHg.   Significant Events: 9/21 approximate onset of GI symptoms 9/27 onset of  pulmonary symptoms 9/28 admit  9/30 transfer to ICU 10/3 transfer back to PCU 10/16 TTE 10/17 transfer to ICU with respiratory distress/obtundation -intubation 10/22 small R pneumothorax -chest tube placed  Antibiotics: Anti-infectives (From admission, onward)   Start     Dose/Rate Route Frequency Ordered Stop   12/10/18 1200  ceFAZolin (ANCEF) IVPB 1 g/50 mL premix     1 g 100 mL/hr over 30 Minutes Intravenous Every 8 hours 12/10/18 1144     12/03/18 1600  remdesivir 100 mg in sodium chloride 0.9 % 250 mL IVPB     100 mg 500 mL/hr over 30 Minutes Intravenous Every 24 hours 12/02/18 1914 12/06/18 1739   12/02/18 2030  remdesivir 100 mg in sodium chloride 0.9 % 250 mL IVPB     100 mg 500 mL/hr over 30 Minutes Intravenous  Once 12/02/18 1914 12/03/18 0817   11/13/18 1600  remdesivir 100 mg in sodium chloride 0.9 % 250 mL IVPB     100 mg 500 mL/hr over 30 Minutes Intravenous Every 24 hours 06-12-2018 2057 11/16/18 1845   11/13/18 1200  azithromycin (ZITHROMAX) 500 mg in sodium chloride 0.9 % 250 mL IVPB     500 mg 250 mL/hr over 60 Minutes Intravenous  Once 06-12-2018 1837 11/13/18 1340   11/13/18 1200  cefTRIAXone (ROCEPHIN) 1 g in sodium chloride 0.9 % 100 mL IVPB  Status:  Discontinued     1 g 200 mL/hr over 30 Minutes Intravenous Every 24 hours 06-12-2018 1837 11/16/18 1334   06-12-2018 2200  remdesivir 200 mg in sodium chloride 0.9 % 250 mL IVPB     200 mg 500 mL/hr over 30 Minutes Intravenous Once 06-12-2018 2057 06-12-2018 2213  Dec 02, 2018 1415  cefTRIAXone (ROCEPHIN) 1 g in sodium chloride 0.9 % 100 mL IVPB     1 g 200 mL/hr over 30 Minutes Intravenous  Once Dec 02, 2018 1402 02-Dec-2018 1517   12/02/18 1415  azithromycin (ZITHROMAX) 500 mg in sodium chloride 0.9 % 250 mL IVPB     500 mg 250 mL/hr over 60 Minutes Intravenous  Once 02-Dec-2018 1402 Dec 02, 2018 1736      Subjective/Interval History: Patient intubated and sedated.    Assessment/Plan:  Acute Hypoxic Resp. Failure/Pneumonia due  to COVID-19  Vent Mode: PRVC FiO2 (%):  [60 %-70 %] 60 % Set Rate:  [35 bmp] 35 bmp Vt Set:  [450 mL] 450 mL PEEP:  [12 cmH20] 12 cmH20 Plateau Pressure:  [32 cmH20-43 cmH20] 39 cmH20     Component Value Date/Time   PHART 7.257 (L) 12/10/2018 1206   PCO2ART 83.2 (HH) 12/10/2018 1206   PO2ART 54.0 (L) 12/10/2018 1206   HCO3 36.9 (H) 12/10/2018 1206   TCO2 39 (H) 12/10/2018 1206   ACIDBASEDEF 1.0 12/07/2018 1432   O2SAT 79.0 12/10/2018 1206    Lab Results  Component Value Date   SARSCOV2NAA POSITIVE (A) 2018/12/02   COVID-19 specific Treatment: Remdesivir 9/28 >10/2 Solumedrol 9/28 > 10/7 Actemra 9/29 Convalescent plasma - patient declined Solumedrol 10/16 > 10/18 Remdesivir 10/18 >10/22 Decadron 10/18 >10/22  From a COVID-19 perspective patient has completed course of steroids and remdesivir.  She was given Actemra as well.  She declined convalescent plasma.  She has had 2 courses of remdesivir actually.  CT angiogram of chest was negative for PE.  Lower extremity Doppler studies negative for DVT.  Pulmonology is following for ventilator management.  Small right-sided pneumothorax Apparent on chest x-ray from 10/22.  Chest tube placed by PCCM.  Management per them.  Staph aureus pneumonia Tracheal aspirate from 10/25 grew staph aureus.  Continue current antibiotic treatment with cefazolin.  Acute kidney injury Initially creatinine had peaked at 2.14.  Creatinine again noted to be climbing.  Patient has been given furosemide due to third spacing and volume overload.  Had reasonably good urine output.  Her BUN however continues to climb along with a creatinine.  Discussed with PCCM today.  To plan to initiate Lasix infusion.  If there is no improvement in the next 24 hours then we will involve nephrology as she may need to undergo CRRT.  Acute metabolic encephalopathy Likely due to hypoxia and acute illness.  EEG did not show any seizure activity.  CT head was unrevealing.   Will need to see how she does off of sedation when she is more stable.  Macrocytic anemia Folic acid was noted to be low and is being repleted.  No evidence of overt bleeding otherwise.  Hypotension Blood pressure is reasonably well controlled.  She was requiring pressors previously.  Currently off of Levophed.  Hyperkalemia Lokelma to be given.  Recheck labs tomorrow.  Hypernatremia Improved.  Sodium level is normal today.  Continue to monitor. Free water.  Transaminitis Secondary to COVID-19.  Improved.  History of ruptured cerebral artery aneurysm  FEN Not on fluids.  Continue tube feedings.  Monitor electrolytes.  Lokelma for elevated potassium level as discussed above.  Obesity Estimated body mass index is 35 kg/m as calculated from the following:   Height as of this encounter:  (1.651 m).   Weight as of this encounter: 95.4 kg.   DVT Prophylaxis: Subcutaneous heparin at high dose PUD Prophylaxis: Protonix Code Status: Partial code Family  Communication: Pulmonology has discussed with family member Disposition Plan: Remain in ICU for now   Medications:  Scheduled:  chlorhexidine  15 mL Mouth/Throat BID   Chlorhexidine Gluconate Cloth  6 each Topical Daily   feeding supplement (PRO-STAT SUGAR FREE 64)  60 mL Per Tube TID   [START ON 11/28/2018] folic acid  1 mg Per Tube Daily   free water  400 mL Per Tube Q3H   heparin injection (subcutaneous)  10,000 Units Subcutaneous Q8H   hydrocortisone sodium succinate  50 mg Intravenous Q6H   insulin aspart  0-15 Units Subcutaneous Q4H   lactulose  30 g Per Tube Q6H   mouth rinse  15 mL Mouth Rinse 10 times per day   pantoprazole sodium  40 mg Per Tube Daily   sodium chloride flush  10-40 mL Intracatheter Q12H   Continuous:  sodium chloride Stopped (12/03/18 1459)   albumin human 25 g (12/12/18 1258)    ceFAZolin (ANCEF) IV 1 g (12/12/18 0916)   dexmedetomidine (PRECEDEX) IV infusion 0.3 mcg/kg/hr  (12/12/18 0727)   feeding supplement (PIVOT 1.5 CAL) 35 mL/hr at 12/12/18 0600   fentaNYL infusion INTRAVENOUS 50 mcg/hr (12/12/18 0600)   furosemide (LASIX) infusion 10 mg/hr (12/12/18 1209)   norepinephrine (LEVOPHED) Adult infusion Stopped (12/10/18 1253)   JXB:JYNWGN chloride, acetaminophen, albuterol, artificial tears, bisacodyl, docusate, fentaNYL, hydrALAZINE, metoprolol tartrate, midazolam, [DISCONTINUED] ondansetron **OR** ondansetron (ZOFRAN) IV, sodium chloride flush   Objective:  Vital Signs  Vitals:   12/12/18 0812 12/12/18 0900 12/12/18 0928 12/12/18 1232  BP: (!) 91/50 (!) 89/47 130/61 (!) 113/54  Pulse: 91 91 99 (!) 104  Resp: (!) 43 (!) 40 (!) 40 (!) 38  Temp:  100.2 F (37.9 C) 99.5 F (37.5 C)   TempSrc:  Axillary Axillary   SpO2: 97% 96% 98% 97%  Weight:      Height:        Intake/Output Summary (Last 24 hours) at 12/12/2018 1427 Last data filed at 12/12/2018 0600 Gross per 24 hour  Intake 2954.64 ml  Output 1200 ml  Net 1754.64 ml   Filed Weights   12/08/18 0403 12/09/18 0500 12/10/18 0500  Weight: 96.2 kg 95.3 kg 95.4 kg    General appearance: Intubated and sedated Resp: Coarse breath sounds bilaterally with crackles bilateral bases.  No wheezing or rhonchi. Cardio: S1-S2 is normal regular.  No S3-S4.  No rubs murmurs or bruit GI: Abdomen is soft.  Nontender nondistended.  Bowel sounds are present normal.  No masses organomegaly Extremities: Edema bilateral lower extremities Neurologic: Intubated sedated   Lab Results:  Data Reviewed: I have personally reviewed following labs and imaging studies  CBC: Recent Labs  Lab 12/06/18 0545 12/07/18 0408  12/08/18 0430  12/09/18 0411 12/09/18 0445 12/10/18 1206 12/11/18 0435 12/12/18 0649  WBC 9.4 11.3*  --  12.3*  --   --  13.4*  --  11.7* 14.3*  NEUTROABS 6.7  --   --   --   --   --   --   --   --   --   HGB 10.6* 10.5*   < > 9.8*   < > 10.2* 9.5* 9.9* 7.6* 8.4*  HCT 37.8 39.1   < >  36.0   < > 30.0* 35.1* 29.0* 27.6* 30.9*  MCV 100.0 103.2*  --  101.7*  --   --  102.3*  --  103.0* 101.0*  PLT 251 230  --  200  --   --  213  --  192 212   < > = values in this interval not displayed.    Basic Metabolic Panel: Recent Labs  Lab 12/07/18 0408  12/08/18 0430  12/09/18 0445 12/10/18 0440 12/10/18 1206 12/11/18 0435 12/12/18 0649  NA 152*   < > 153*   < > 156* 157* 154* 149* 144  K 5.7*   < > 5.6*   < > 5.0 5.0 5.2* 4.2 5.2*  CL 117*  --  119*  --  116* 116*  --  116* 102  CO2 32  --  29  --  34* 35*  --  25 32  GLUCOSE 201*  --  135*  --  131* 128*  --  123* 126*  BUN 96*  --  93*  --  99* 116*  --  104* 175*  CREATININE 1.59*  --  1.43*  --  1.65* 1.91*  --  2.07* 3.29*  CALCIUM 8.3*  --  8.1*  --  8.3* 8.1*  --  5.9* 7.6*  MG 3.0*  --   --   --   --   --   --   --  3.2*  PHOS  --   --   --   --  3.5  --   --  3.3 5.7*   < > = values in this interval not displayed.    GFR: Estimated Creatinine Clearance: 20 mL/min (A) (by C-G formula based on SCr of 3.29 mg/dL (H)).  Liver Function Tests: Recent Labs  Lab 12/06/18 0545 12/07/18 0408 12/08/18 0430 12/09/18 0445 12/11/18 0435 12/12/18 0649  AST 15 20 20   --   --  47*  ALT 24 23 21   --   --  17  ALKPHOS 72 71 63  --   --  63  BILITOT 0.1* 0.3 0.2*  --   --  0.1*  PROT 6.8 6.3* 6.1*  --   --  6.5  ALBUMIN 2.4* 2.0* 1.8* 1.7* <1.0* 1.5*    Recent Labs  Lab 12/12/18 0647  AMMONIA 82*     CBG: Recent Labs  Lab 12/11/18 1954 12/12/18 0005 12/12/18 0430 12/12/18 0806 12/12/18 1123  GLUCAP 114* 155* 140* 107* 131*    Lipid Profile: Recent Labs    12/10/18 0440  TRIG 170*    Anemia Panel: Recent Labs    12/10/18 0440  VITAMINB12 469  FOLATE 4.5*  FERRITIN 1,112*  TIBC 125*  IRON 26*  RETICCTPCT 1.5    Recent Results (from the past 240 hour(s))  Culture, respiratory (non-expectorated)     Status: None   Collection Time: 12/08/18  8:17 AM   Specimen: Tracheal Aspirate;  Respiratory  Result Value Ref Range Status   Specimen Description   Final    TRACHEAL ASPIRATE Performed at [facility] [address], [place], [place] [address]    Special Requests   Final    Normal Performed at [facility], [place] [address], [place], [place] [address]    Gram Stain   Final    RARE WBC PRESENT, PREDOMINANTLY PMN NO ORGANISMS SEEN Performed at [facility], [place] [address] [place],  [address]    Culture FEW STAPHYLOCOCCUS AUREUS  Final   Report Status 12/10/2018 FINAL  Final   Organism ID, Bacteria STAPHYLOCOCCUS AUREUS  Final      Susceptibility   Staphylococcus aureus - MIC*    CIPROFLOXACIN >=8 RESISTANT Resistant  ERYTHROMYCIN 0.5 SENSITIVE Sensitive     GENTAMICIN <=0.5 SENSITIVE Sensitive     OXACILLIN <=0.25 SENSITIVE Sensitive     TETRACYCLINE <=1 SENSITIVE Sensitive     VANCOMYCIN 1 SENSITIVE Sensitive     TRIMETH/SULFA <=10 SENSITIVE Sensitive     CLINDAMYCIN <=0.25 SENSITIVE Sensitive     RIFAMPIN <=0.5 SENSITIVE Sensitive     Inducible Clindamycin NEGATIVE Sensitive     * FEW STAPHYLOCOCCUS AUREUS      Radiology Studies: Dg Chest Port 1 View  Result Date: 12/12/2018 CLINICAL DATA:  Endotracheal tube placement. EXAM: PORTABLE CHEST 1 VIEW COMPARISON:  December 10, 2018. FINDINGS: Stable cardiomediastinal silhouette. Endotracheal and feeding tubes are unchanged in position. Left-sided chest tube is noted without pneumothorax. Right-sided PICC line is unchanged. Stable bilateral lung opacities are noted concerning for multifocal pneumonia. IMPRESSION: Stable support apparatus. Stable left-sided chest tube without pneumothorax. Stable bilateral lung opacities consistent with multifocal pneumonia. Electronically Signed   By: Lupita Raider M.D.   On: 12/12/2018 07:42       LOS: 30 days   Elandra Powell Rito Ehrlich  [facility] Pager on [email or web address]  12/12/2018, 2:27 PM

## 2018-12-13 ENCOUNTER — Inpatient Hospital Stay (HOSPITAL_COMMUNITY): Payer: BC Managed Care – PPO

## 2018-12-13 DIAGNOSIS — Z515 Encounter for palliative care: Secondary | ICD-10-CM

## 2018-12-13 DIAGNOSIS — Z7189 Other specified counseling: Secondary | ICD-10-CM

## 2018-12-13 LAB — POCT I-STAT 7, (LYTES, BLD GAS, ICA,H+H)
Acid-base deficit: 2 mmol/L (ref 0.0–2.0)
Bicarbonate: 30.8 mmol/L — ABNORMAL HIGH (ref 20.0–28.0)
Bicarbonate: 30.2 mmol/L — ABNORMAL HIGH (ref 20.0–28.0)
Bicarbonate: 29.9 mmol/L — ABNORMAL HIGH (ref 20.0–28.0)
Bicarbonate: 27.8 mmol/L (ref 20.0–28.0)
Calcium, Ion: 1.1 mmol/L — ABNORMAL LOW (ref 1.15–1.40)
Calcium, Ion: 1.1 mmol/L — ABNORMAL LOW (ref 1.15–1.40)
Calcium, Ion: 1.09 mmol/L — ABNORMAL LOW (ref 1.15–1.40)
Calcium, Ion: 1.09 mmol/L — ABNORMAL LOW (ref 1.15–1.40)
HCT: 28 % — ABNORMAL LOW (ref 36.0–46.0)
HCT: 27 % — ABNORMAL LOW (ref 36.0–46.0)
HCT: 26 % — ABNORMAL LOW (ref 36.0–46.0)
HCT: 26 % — ABNORMAL LOW (ref 36.0–46.0)
Hemoglobin: 9.5 g/dL — ABNORMAL LOW (ref 12.0–15.0)
Hemoglobin: 9.2 g/dL — ABNORMAL LOW (ref 12.0–15.0)
Hemoglobin: 8.8 g/dL — ABNORMAL LOW (ref 12.0–15.0)
Hemoglobin: 8.8 g/dL — ABNORMAL LOW (ref 12.0–15.0)
O2 Saturation: 68 %
O2 Saturation: 67 %
O2 Saturation: 88 %
O2 Saturation: 85 %
Patient temperature: 98.6
Patient temperature: 99.3
Patient temperature: 99.3
Patient temperature: 99.1
Potassium: 4 mmol/L (ref 3.5–5.1)
Potassium: 3.9 mmol/L (ref 3.5–5.1)
Potassium: 3.5 mmol/L (ref 3.5–5.1)
Potassium: 3.4 mmol/L — ABNORMAL LOW (ref 3.5–5.1)
Sodium: 140 mmol/L (ref 135–145)
Sodium: 139 mmol/L (ref 135–145)
Sodium: 138 mmol/L (ref 135–145)
Sodium: 138 mmol/L (ref 135–145)
TCO2: 34 mmol/L — ABNORMAL HIGH (ref 22–32)
TCO2: 33 mmol/L — ABNORMAL HIGH (ref 22–32)
TCO2: 33 mmol/L — ABNORMAL HIGH (ref 22–32)
TCO2: 30 mmol/L (ref 22–32)
pCO2 arterial: 92.9 mmHg (ref 32.0–48.0)
pCO2 arterial: 93.6 mmHg (ref 32.0–48.0)
pCO2 arterial: 87.2 mmHg (ref 32.0–48.0)
pCO2 arterial: 78.6 mmHg (ref 32.0–48.0)
pH, Arterial: 7.128 — CL (ref 7.350–7.450)
pH, Arterial: 7.119 — CL (ref 7.350–7.450)
pH, Arterial: 7.146 — CL (ref 7.350–7.450)
pH, Arterial: 7.158 — CL (ref 7.350–7.450)
pO2, Arterial: 49 mmHg — ABNORMAL LOW (ref 83.0–108.0)
pO2, Arterial: 50 mmHg — ABNORMAL LOW (ref 83.0–108.0)
pO2, Arterial: 74 mmHg — ABNORMAL LOW (ref 83.0–108.0)
pO2, Arterial: 66 mmHg — ABNORMAL LOW (ref 83.0–108.0)

## 2018-12-13 LAB — CBC
HCT: 23.1 % — ABNORMAL LOW (ref 36.0–46.0)
Hemoglobin: 6.4 g/dL — CL (ref 12.0–15.0)
MCH: 27.9 pg (ref 26.0–34.0)
MCHC: 27.7 g/dL — ABNORMAL LOW (ref 30.0–36.0)
MCV: 100.9 fL — ABNORMAL HIGH (ref 80.0–100.0)
Platelets: 174 10*3/uL (ref 150–400)
RBC: 2.29 MIL/uL — ABNORMAL LOW (ref 3.87–5.11)
RDW: 15.9 % — ABNORMAL HIGH (ref 11.5–15.5)
WBC: 12 10*3/uL — ABNORMAL HIGH (ref 4.0–10.5)
nRBC: 0.3 % — ABNORMAL HIGH (ref 0.0–0.2)

## 2018-12-13 LAB — BASIC METABOLIC PANEL
Anion gap: 10 (ref 5–15)
BUN: 148 mg/dL — ABNORMAL HIGH (ref 8–23)
CO2: 23 mmol/L (ref 22–32)
Calcium: 6.5 mg/dL — ABNORMAL LOW (ref 8.9–10.3)
Chloride: 103 mmol/L (ref 98–111)
Creatinine, Ser: 3.45 mg/dL — ABNORMAL HIGH (ref 0.44–1.00)
GFR calc Af Amer: 16 mL/min — ABNORMAL LOW (ref >60)
GFR calc non Af Amer: 13 mL/min — ABNORMAL LOW (ref >60)
Glucose, Bld: 186 mg/dL — ABNORMAL HIGH (ref 70–99)
Potassium: 3.1 mmol/L — ABNORMAL LOW (ref 3.5–5.1)
Sodium: 136 mmol/L (ref 135–145)

## 2018-12-13 LAB — AMMONIA: Ammonia: 76 umol/L — ABNORMAL HIGH (ref 9–35)

## 2018-12-13 LAB — MAGNESIUM: Magnesium: 2.8 mg/dL — ABNORMAL HIGH (ref 1.7–2.4)

## 2018-12-13 LAB — GLUCOSE, CAPILLARY
Glucose-Capillary: 152 mg/dL — ABNORMAL HIGH (ref 70–99)
Glucose-Capillary: 155 mg/dL — ABNORMAL HIGH (ref 70–99)
Glucose-Capillary: 158 mg/dL — ABNORMAL HIGH (ref 70–99)
Glucose-Capillary: 188 mg/dL — ABNORMAL HIGH (ref 70–99)

## 2018-12-13 LAB — PHOSPHORUS: Phosphorus: 7.2 mg/dL — ABNORMAL HIGH (ref 2.5–4.6)

## 2018-12-13 LAB — PREPARE RBC (CROSSMATCH)

## 2018-12-13 MED ORDER — ACETAMINOPHEN 325 MG PO TABS: 650.0000 mg | ORAL_TABLET | Freq: Four times a day (QID) | ORAL | Status: DC | PRN

## 2018-12-13 MED ORDER — PRO-STAT SUGAR FREE PO LIQD: 30.0000 mL | Freq: Two times a day (BID) | ORAL | Status: DC

## 2018-12-13 MED ORDER — POLYVINYL ALCOHOL 1.4 % OP SOLN
1.0000 [drp] | Freq: Four times a day (QID) | OPHTHALMIC | Status: DC | PRN
  Filled 2018-12-13: qty 15

## 2018-12-13 MED ORDER — SODIUM CHLORIDE 0.9% IV SOLUTION
Freq: Once | INTRAVENOUS | Status: AC
  Administered 2018-12-13: 08:00:00 via INTRAVENOUS

## 2018-12-13 MED ORDER — DIPHENHYDRAMINE HCL 50 MG/ML IJ SOLN: 25.0000 mg | INTRAMUSCULAR | Status: DC | PRN

## 2018-12-13 MED ORDER — ACETAMINOPHEN 650 MG RE SUPP: 650.0000 mg | Freq: Four times a day (QID) | RECTAL | Status: DC | PRN

## 2018-12-13 MED ORDER — GLYCOPYRROLATE 0.2 MG/ML IJ SOLN: 0.2000 mg | INTRAMUSCULAR | Status: DC | PRN

## 2018-12-13 MED ORDER — CEFAZOLIN SODIUM-DEXTROSE 1-4 GM/50ML-% IV SOLN
1.0000 g | Freq: Two times a day (BID) | INTRAVENOUS | Status: DC
  Filled 2018-12-13: qty 50

## 2018-12-13 MED ORDER — PIVOT 1.5 CAL PO LIQD: 1000.0000 mL | ORAL | Status: DC

## 2018-12-13 MED ORDER — MORPHINE BOLUS VIA INFUSION
5.0000 mg | INTRAVENOUS | Status: DC | PRN
  Filled 2018-12-13: qty 5

## 2018-12-13 MED ORDER — GLYCOPYRROLATE 1 MG PO TABS
1.0000 mg | ORAL_TABLET | ORAL | Status: DC | PRN
  Filled 2018-12-13: qty 1

## 2018-12-13 MED ORDER — DEXTROSE 5 % IV SOLN
INTRAVENOUS | Status: DC
  Administered 2018-12-13: 18:00:00 via INTRAVENOUS

## 2018-12-13 MED ORDER — MORPHINE 100MG IN NS 100ML (1MG/ML) PREMIX INFUSION
0.0000 mg/h | INTRAVENOUS | Status: DC
  Administered 2018-12-13: 18:00:00 5 mg/h via INTRAVENOUS
  Filled 2018-12-13: qty 100

## 2018-12-13 MED ORDER — MORPHINE SULFATE (PF) 2 MG/ML IV SOLN: 2.0000 mg | INTRAVENOUS | Status: DC | PRN

## 2018-12-16 NOTE — Progress Notes (Signed)
PHARMACY NOTE:  ANTIMICROBIAL RENAL DOSAGE ADJUSTMENT  Current antimicrobial regimen includes a mismatch between antimicrobial dosage and estimated renal function.  As per policy approved by the Pharmacy & Therapeutics and Medical Executive Committees, the antimicrobial dosage will be adjusted accordingly.  Current antimicrobial dosage:  Cefazolin 1g IV Q8 hrs  Indication: Pneumonia  Renal Function: Worsening with Estimated Creatinine Clearance: 20.4 mL/min (A) (by C-G formula based on SCr of 3.45 mg/dL (H)). []      On intermittent HD, scheduled: []      On CRRT    Antimicrobial dosage has been changed to:  Cefazolin 1g IV Q12 hrs  Richardine Service, PharmD PGY1 Pharmacy Resident Phone: [phone] 2018/12/18  2:27 PM  Please check [email or web address] for unit-specific pharmacy phone numbers.

## 2018-12-16 NOTE — Progress Notes (Signed)
Pt's daughter was called to get her to connect with pt on face call. Elink was able to set up the call and daughter saw her mom on video. Daughter had some questions, which were answered and she was tearful about how her mom look. She was told that she could call back anytime if she has anymore questions.

## 2018-12-16 NOTE — Progress Notes (Signed)
Wasted 200cc of fentanyl gtt in [employer] canister with [employer], RN,

## 2018-12-16 NOTE — Procedures (Signed)
Extubation Procedure Note  Patient Details:   Name: Shelley Allison DOB: May 13, 1955 MRN: [id number]   Airway Documentation:    Vent end date: 12/03/2018 Vent end time: 1805   Evaluation   Pt extubated to RA per Withdrawal of Life Protocol  Jesse Sans 12/01/2018, 6:24 PM

## 2018-12-16 NOTE — Progress Notes (Signed)
Assisted tele visit to patient with daughter.  Harrison Paulson Samson, RN  

## 2018-12-16 NOTE — Death Summary Note (Signed)
DEATH SUMMARY   Patient Details  Name: Shelley Allison MRN: [id number] DOB: 07/20/1955  Admission/Discharge Information   Admit Date:  11-15-18  Date of Death: Date of Death: 2018/12/16  Time of Death: Time of Death: 1814-06-17  Length of Stay: 31  Referring Physician: Leeroy Bock, DO   Reason(s) for Hospitalization  Pneumonia due to COVID-19 Acute respiratory failure with hypoxia  Diagnoses  Preliminary cause of death: Pneumonia due to COVID-19  Secondary Diagnoses (including complications and co-morbidities):    OBESITY, NOS   HYPERTENSION, BENIGN SYSTEMIC   CEREBRAL ANEURYSM, RUPTURED   Pneumonia due to COVID-19 virus   Acute respiratory failure with hypoxemia (HCC)   Acute pulmonary embolus (HCC)   Essential hypertension   AKI (acute kidney injury) (HCC)   Morbid obesity with BMI of 70 and over, adult (HCC)   CAP (community acquired pneumonia)   Acute respiratory disease due to COVID-19 virus   Hypernatremia   Goals of care, counseling/discussion   Palliative care encounter   Brief Hospital Course (including significant findings, care, treatment, and services provided and events leading to death)    Brief History/Interval Summary: 63yo with a history of HTN and cerebral artery aneurysm rupture who presented to the ED November 15, 2022 with 1 week of nausea vomiting and diarrhea followed by progressive shortness of breath with fever which began on 9/27. She had attended a large wedding prior to the onset of her symptoms. In the ED she was found to be hypoxic and confirmed to be SARS-CoV-2 positive. Chest x-ray revealed bilateral infiltrates.   Brief Hospital course  Acute Hypoxic Resp. Failure/Pneumonia due to COVID-19 Patient had to be intubated and placed on mechanical ventilation.  Pulmonology was following.  CT angiogram of the chest was negative for PE.  Lower extremity Doppler studies negative for DVT.  Patient was given a total of 2 courses of remdesivir and 2  courses of steroids.  Despite aggressive treatment patient did not improve and actually was declining.  Patient also developed renal failure as discussed below. PCCM held discussions with family members.  Patient was subsequently transition to comfort care.  She was extubated after family had a chance to visit with the patient.  She subsequently expired on December 16, 2022 at 6:16 PM.  Small right-sided pneumothorax Apparent on chest x-ray from 10/22.  Chest tube placed by PCCM.    Staph aureus pneumonia Tracheal aspirate from 10/25 grew staph aureus.    Acute kidney injury Patient's creatinine continued to rise.  She is not making much urine either.  Patient has been on furosemide due to third spacing and volume overload.    Acute metabolic encephalopathy Likely due to hypoxia and acute illness.  EEG did not show any seizure activity.  CT head was unrevealing.    Macrocytic anemia/ folic acid deficiency Hemoglobin noted to be lower.  No obvious evidence for bleeding.  Folic acid being repleted.    Hypotension Was requiring pressors.  Hyperkalemia Resolved with Lokelma  Hypernatremia Improved with free water  Transaminitis Secondary to COVID-19.  Improved.  History of ruptured cerebral artery aneurysm  Diarrhea Has been having significant watery stools.  Lactulose was held  Obesity Estimated body mass index is 39.52 kg/m as calculated from the following:   Height as of this encounter:  (1.651 m).   Weight as of this encounter: 107.7 kg.     Pertinent Labs and Studies  Significant Diagnostic Studies Dg Abd 1 View  Result Date: 12/01/2018 CLINICAL DATA:  Orogastric  tube placement. EXAM: ABDOMEN - 1 VIEW COMPARISON:  None. FINDINGS: Tip and side port of the enteric tube below the diaphragm in the stomach. No dilated bowel loops to suggest obstruction. No evidence of free air. Cholecystectomy clips in the right upper quadrant. Bilateral pulmonary opacities as seen on  radiograph earlier today. IMPRESSION: Tip and side port of the enteric tube below the diaphragm in the stomach. Electronically Signed   By: Narda Rutherford M.D.   On: 12/01/2018 23:30   Ct Head Wo Contrast  Result Date: 12/01/2018 CLINICAL DATA:  Altered level of consciousness. Found unresponsive in the bathroom. History of ruptured cerebral aneurysm. EXAM: CT HEAD WITHOUT CONTRAST TECHNIQUE: Contiguous axial images were obtained from the base of the skull through the vertex without intravenous contrast. COMPARISON:  Head CT 04/15/2015 FINDINGS: Brain: No evidence of acute hemorrhage, no subarachnoid or intraventricular blood. No subdural hemorrhage. No evidence of acute ischemia. No hydrocephalus, midline shift or mass effect. Encephalomalacia in the left temporal lobe subjacent to craniotomy defect, unchanged. 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 Vascular: Left aneurysm clipping, unchanged. No evidence of hyperdense vessel, streak artifact from aneurysm clip partially obscures evaluation of the left MCA. Skull: Left frontotemporal craniotomy. No fracture or focal lesion. Sinuses/Orbits: No acute finding. Other: None. IMPRESSION: 1. No acute intracranial abnormality. 2. Stable postsurgical change from left-sided aneurysm clipping. Electronically Signed   By: Narda Rutherford M.D.   On: 12/01/2018 21:42   Ct Angio Chest Pe W Or Wo Contrast  Result Date: 11/17/2018 CLINICAL DATA:  Positive D-dimer. COVID-19 positive. Shortness of breath. Possible pulmonary embolism. EXAM: CT ANGIOGRAPHY CHEST WITH CONTRAST TECHNIQUE: Multidetector CT imaging of the chest was performed using the standard protocol during bolus administration of intravenous contrast. Multiplanar CT image reconstructions and MIPs were obtained to evaluate the vascular anatomy. CONTRAST:  65mL OMNIPAQUE IOHEXOL 350 MG/ML SOLN COMPARISON:  None. FINDINGS: Cardiovascular: Mild cardiomegaly. Thoracic aorta is within normal.  Pulmonary arterial system is well opacified without evidence of emboli. Remaining vascular structures are unremarkable. Mediastinum/Nodes: 1.5 cm precarinal lymph node and 1.1 cm subcarinal lymph node likely reactive. Remaining mediastinal structures are unremarkable. Lungs/Pleura: Lungs are adequately inflated demonstrate moderate bilateral patchy consolidative and ground-glass airspace process worse over the mid to upper lungs likely multifocal pneumonia. No evidence of effusion. Airways are normal. Upper Abdomen: No acute findings. Musculoskeletal: Mild degenerative change of the spine. Review of the MIP images confirms the above findings. IMPRESSION: 1.  No evidence of pulmonary embolism. 2. Moderate bilateral patchy multifocal airspace process likely multifocal pneumonia. Mild mediastinal reactive adenopathy. 3.  Mild cardiomegaly. Electronically Signed   By: Elberta Fortis M.D.   On: 11/17/2018 11:57   Dg Chest Port 1 View  Result Date: Dec 22, 2018 CLINICAL DATA:  ET tube EXAM: PORTABLE CHEST 1 VIEW COMPARISON:  12/12/2018 FINDINGS: Support devices are stable. Left chest tube in place. No pneumothorax. Diffuse bilateral airspace disease similar to prior study. No effusions or pneumothorax. No acute bony abnormality. IMPRESSION: Stable support devices.  No pneumothorax. Diffuse bilateral airspace disease, unchanged. Electronically Signed   By: Charlett Nose M.D.   On: 12-22-18 08:46   Dg Chest Port 1 View  Result Date: 12/12/2018 CLINICAL DATA:  Endotracheal tube placement. EXAM: PORTABLE CHEST 1 VIEW COMPARISON:  December 10, 2018. FINDINGS: Stable cardiomediastinal silhouette. Endotracheal and feeding tubes are unchanged in position. Left-sided chest tube is noted without pneumothorax. Right-sided PICC line is unchanged. Stable bilateral lung opacities are noted concerning for multifocal pneumonia. IMPRESSION:  Stable support apparatus. Stable left-sided chest tube without pneumothorax. Stable  bilateral lung opacities consistent with multifocal pneumonia. Electronically Signed   By: Lupita Raider M.D.   On: 12/12/2018 07:42   Dg Chest Port 1 View  Result Date: 12/10/2018 CLINICAL DATA:  Left pneumothorax. EXAM: PORTABLE CHEST 1 VIEW COMPARISON:  December 09, 2018. FINDINGS: Endotracheal and feeding tubes are unchanged in position. Right-sided PICC line is unchanged in position. Stable left-sided chest tube is noted without pneumothorax. Stable bilateral lung opacities are noted consistent with pneumonia. Bony thorax is unremarkable. IMPRESSION: Stable support apparatus. Stable left-sided chest tube without pneumothorax. Stable bilateral lung opacities are noted consistent with multifocal pneumonia. Electronically Signed   By: Lupita Raider M.D.   On: 12/10/2018 08:07   Dg Chest Port 1 View  Result Date: 12/09/2018 CLINICAL DATA:  Pneumothorax EXAM: PORTABLE CHEST 1 VIEW COMPARISON:  Radiograph 12/09/2018 FINDINGS: Stable satisfactory positioning of the transesophageal tube, endotracheal tube, right upper extremity PICC and left pleural drain. Additional support devices overlie the chest. Bilateral heterogeneous interstitial and airspace disease seen throughout both lungs more pronounced in the right upper lung is unchanged from comparison exam. No visible pneumothorax or effusion. No acute osseous or soft tissue abnormality. IMPRESSION: No significant interval change. Electronically Signed   By: Kreg Shropshire M.D.   On: 12/09/2018 18:20   Dg Chest Port 1 View  Result Date: 12/09/2018 CLINICAL DATA:  COVID-19 EXAM: PORTABLE CHEST 1 VIEW COMPARISON:  12/08/2018 FINDINGS: No significant interval change in AP portable examination with left-sided chest tube, no significant pneumothorax, endotracheal tube and partially imaged enteric feeding tube in appropriate position, and right upper extremity PICC. Unchanged diffuse bilateral heterogeneous and interstitial airspace disease. Cardiomegaly.  IMPRESSION: No significant interval change in AP portable examination. Electronically Signed   By: Lauralyn Primes M.D.   On: 12/09/2018 15:23   Dg Chest Port 1 View  Result Date: 12/08/2018 CLINICAL DATA:  Pneumothorax EXAM: PORTABLE CHEST 1 VIEW COMPARISON:  Chest radiograph dated 12/07/2018 FINDINGS: An endotracheal tube terminates in the midthoracic trachea. An enteric tube enters the stomach and terminates below the field of view. A right-sided peripherally inserted central venous catheter tip overlies the superior vena cava. A left-sided thoracostomy tube tip overlies the left mid lung. The cardiac silhouette is not significantly changed. Patchy bilateral interstitial and airspace opacities are not significantly changed. A small left pleural effusion may contribute. There is no right pleural effusion. There is no large pneumothorax. IMPRESSION: 1. No significant interval change in the bilateral interstitial and airspace opacities. Electronically Signed   By: Romona Curls M.D.   On: 12/08/2018 13:09   Dg Chest Port 1 View  Result Date: 12/07/2018 CLINICAL DATA:  Evaluate pneumothorax EXAM: PORTABLE CHEST 1 VIEW COMPARISON:  12/07/2018 FINDINGS: Cardiac shadow is stable. Endotracheal tube, feeding catheter and right-sided PICC line are again noted and stable. Left chest tube is again seen and stable. No pneumothorax is seen. Diffuse parenchymal opacities are again identified throughout both lungs but slightly improved when compared with the prior exam. IMPRESSION: Slight improvement in parenchymal opacities bilaterally. No evidence of pneumothorax. Electronically Signed   By: Alcide Clever M.D.   On: 12/07/2018 14:57   Dg Chest Port 1 View  Result Date: 12/07/2018 CLINICAL DATA:  COVID-19 positivity EXAM: PORTABLE CHEST 1 VIEW COMPARISON:  12/06/2018 FINDINGS: Cardiac shadow is stable. Feeding catheter, endotracheal tube and PICC line are noted in satisfactory position. Left-sided chest tube is seen  without evidence of  pneumothorax. Patchy bilateral airspace opacities are seen consistent with the given clinical history. This is stable in appearance from the prior exam. IMPRESSION: Stable bilateral parenchymal opacities consistent with the given clinical history. Tubes and lines as described. Electronically Signed   By: Alcide Clever M.D.   On: 12/07/2018 07:56   Dg Chest Port 1 View  Result Date: 12/06/2018 CLINICAL DATA:  Chest tube placement. EXAM: PORTABLE CHEST 1 VIEW COMPARISON:  Earlier film, same date. FINDINGS: The endotracheal tube is 2.6 cm above the carina. The feeding tube is coursing down the esophagus and into the stomach. New left-sided chest tube in good position. Persistent small left-sided pneumothorax. Persistent diffuse interstitial and airspace process. IMPRESSION: 1. Interval placement of a left-sided chest tube. Stable small pneumothorax. 2. Stable ET tube and feeding tube. 3. Persistent diffuse but asymmetric interstitial and airspace process. Electronically Signed   By: Rudie Meyer M.D.   On: 12/06/2018 14:17   Dg Chest Port 1 View  Result Date: 12/06/2018 CLINICAL DATA:  Acute respiratory failure with hypoxemia. COVID-19 pneumonia. EXAM: PORTABLE CHEST 1 VIEW COMPARISON:  Chest x-ray dated 12/05/2018 FINDINGS: Endotracheal tube is 2 cm above the carina. Feeding tube tip is below the diaphragm. There is a new small left pneumothorax, less than 5%. The extensive bilateral pulmonary infiltrates appear slightly progressed. Heart size and vascularity are normal.  No bone abnormality. IMPRESSION: New small left pneumothorax, less than 5%. Slight progression of bilateral pulmonary infiltrates. Critical Value/emergent results were called by telephone at the time of interpretation on 12/06/2018 at 11:47 am to [facility] [place], who verbally acknowledged these results. Electronically Signed   By: Francene Boyers M.D.   On: 12/06/2018 11:48   Dg Chest Port 1 View  Result Date:  12/05/2018 CLINICAL DATA:  Respiratory failure EXAM: PORTABLE CHEST 1 VIEW COMPARISON:  Two days ago FINDINGS: Higher endotracheal tube with tip 2.5 cm above the clavicular heads and 7.5 cm above the carina. The orogastric tube remains in stable position. Unchanged bilateral interstitial and airspace infiltrates. Mild cardiomegaly. No visible effusion or pneumothorax. These results will be called to the ordering clinician or representative by the Radiologist Assistant, and communication documented in the PACS or zVision Dashboard. IMPRESSION: 1. Higher endotracheal tube with tip above the clavicular heads and 7.5 cm above the carina. Consider tube advancement 2. Stable atypical pneumonia. Electronically Signed   By: Marnee Spring M.D.   On: 12/05/2018 09:00   Dg Chest Port 1 View  Result Date: 12/03/2018 CLINICAL DATA:  Covert positive.  Intubation. EXAM: PORTABLE CHEST 1 VIEW COMPARISON:  12/01/2018. FINDINGS: Endotracheal tube in stable position. NG tube noted with tip coiled stomach. Heart size normal. Persistent bilateral pulmonary infiltrates, no interim change. No prominent pleural effusion. No pneumothorax. IMPRESSION: 1. Endotracheal tube in stable position. NG tube noted with tip coiled in stomach. 2.  Persistent bilateral pulmonary infiltrates, no interim change. Electronically Signed   By: Maisie Fus  Register   On: 12/03/2018 06:57   Dg Chest Port 1 View  Result Date: 12/01/2018 CLINICAL DATA:  Intubation.  COVID-19. EXAM: PORTABLE CHEST 1 VIEW COMPARISON:  December 01, 2018 FINDINGS: The ETT terminates 15 mm above the carina. Bilateral pulmonary infiltrates are stable. No other changes. IMPRESSION: 1. The ETT terminates 1.5 cm above the carina. Recommend withdrawing 1 cm. 2. Persistent bilateral pulmonary infiltrates. Electronically Signed   By: Gerome Sam III M.D   On: 12/01/2018 20:14   Dg Chest Port 1 View  Result Date: 11/30/2018 CLINICAL DATA:  COVID-19, shortness of breath EXAM:  PORTABLE CHEST 1 VIEW COMPARISON:  11/26/2018 FINDINGS: No significant interval change in AP portable examination with extensive, diffuse heterogeneous and interstitial pulmonary opacity, most dense and consolidative appearing in the left mid lung and peripheral right upper lobe. No new opacity. The heart and mediastinum are unremarkable. IMPRESSION: No significant interval change in AP portable examination with extensive, diffuse heterogeneous and interstitial pulmonary opacity, most dense and consolidative appearing in the left mid lung and peripheral right upper lobe. Findings are consistent with COVID-19 infection. No new opacity. Electronically Signed   By: Lauralyn Primes M.D.   On: 11/30/2018 12:50   Dg Chest Port 1 View  Result Date: 11/26/2018 CLINICAL DATA:  COVID-19 pneumonia. Shortness of breath. EXAM: PORTABLE CHEST 1 VIEW COMPARISON:  11/22/2018 FINDINGS: The cardiac silhouette remains mildly enlarged. Lung volumes remain low with similar appearance of diffuse coarse and patchy opacities throughout both lungs. No sizable pleural effusion or pneumothorax is identified. IMPRESSION: Unchanged bilateral pneumonia. Electronically Signed   By: Sebastian Ache M.D.   On: 11/26/2018 07:52   Dg Chest Port 1 View  Result Date: 11/22/2018 CLINICAL DATA:  COVID-19 EXAM: PORTABLE CHEST 1 VIEW COMPARISON:  Six days ago FINDINGS: Bilateral patchy airspace disease with low lung volumes. Cardiomegaly. No visible effusion or pneumothorax. IMPRESSION: 1. No significant change compared to 6 days ago. 2. Low volume chest with bilateral pneumonia and cardiomegaly. Electronically Signed   By: Marnee Spring M.D.   On: 11/22/2018 08:47   Dg Chest Port 1 View  Result Date: 11/16/2018 CLINICAL DATA:  COVID EXAM: PORTABLE CHEST 1 VIEW COMPARISON:  11/07/2018 FINDINGS: Diffuse interstitial and airspace opacities noted. More focal confluent opacities in the lung bases. Overall airspace disease slightly increased since  prior study. No effusions. No acute bony abnormality. IMPRESSION: Diffuse interstitial and alveolar opacities, most confluent in the lung bases, increased since prior study. Electronically Signed   By: Charlett Nose M.D.   On: 11/16/2018 08:36   Dg Chest Port 1v Same Day  Result Date: 12/01/2018 CLINICAL DATA:  Progressive hypoxia. COVID-19. Respiratory distress. EXAM: PORTABLE CHEST 1 VIEW COMPARISON:  November 30, 2018 FINDINGS: Bilateral diffuse pulmonary infiltrates are stable. The cardiomediastinal silhouette is stable. No other acute abnormalities. IMPRESSION: Stable bilateral pulmonary infiltrates.  No change. Electronically Signed   By: Gerome Sam III M.D   On: 12/01/2018 18:56   Vas [place] Lower Extremity Venous (dvt)  Result Date: 11/15/2018  Lower Venous Study Indications: Elevated Ddimer.  Risk Factors: COVID 19 positive. Comparison Study: No prior studies. Performing Technologist: Chanda Busing RVT  Examination Guidelines: A complete evaluation includes B-mode imaging, spectral Doppler, color Doppler, and power Doppler as needed of all accessible portions of each vessel. Bilateral testing is considered an integral part of a complete examination. Limited examinations for reoccurring indications may be performed as noted.  +---------+---------------+---------+-----------+----------+--------------+ RIGHT    CompressibilityPhasicitySpontaneityPropertiesThrombus Aging +---------+---------------+---------+-----------+----------+--------------+ CFV      Full           Yes      Yes                                 +---------+---------------+---------+-----------+----------+--------------+ SFJ      Full                                                        +---------+---------------+---------+-----------+----------+--------------+  FV Prox  Full                                                        +---------+---------------+---------+-----------+----------+--------------+  FV Mid   Full                                                        +---------+---------------+---------+-----------+----------+--------------+ FV DistalFull                                                        +---------+---------------+---------+-----------+----------+--------------+ PFV      Full                                                        +---------+---------------+---------+-----------+----------+--------------+ POP      Full           Yes      Yes                                 +---------+---------------+---------+-----------+----------+--------------+ PTV      Full                                                        +---------+---------------+---------+-----------+----------+--------------+ PERO     Full                                                        +---------+---------------+---------+-----------+----------+--------------+   +---------+---------------+---------+-----------+----------+--------------+ LEFT     CompressibilityPhasicitySpontaneityPropertiesThrombus Aging +---------+---------------+---------+-----------+----------+--------------+ CFV      Full           Yes      Yes                                 +---------+---------------+---------+-----------+----------+--------------+ SFJ      Full                                                        +---------+---------------+---------+-----------+----------+--------------+ FV Prox  Full                                                        +---------+---------------+---------+-----------+----------+--------------+  FV Mid   Full                                                        +---------+---------------+---------+-----------+----------+--------------+ FV DistalFull                                                        +---------+---------------+---------+-----------+----------+--------------+ PFV      Full                                                         +---------+---------------+---------+-----------+----------+--------------+ POP      Full           Yes      Yes                                 +---------+---------------+---------+-----------+----------+--------------+ PTV      Full                                                        +---------+---------------+---------+-----------+----------+--------------+ PERO     Full                                                        +---------+---------------+---------+-----------+----------+--------------+     Summary: Right: There is no evidence of deep vein thrombosis in the lower extremity. No cystic structure found in the popliteal fossa. Left: There is no evidence of deep vein thrombosis in the lower extremity. No cystic structure found in the popliteal fossa.  *See table(s) above for measurements and observations. Electronically signed by Lemar LivingsBrandon Cain MD on 11/15/2018 at 5:45:26 PM.    Final    [place] Ekg Site Rite  Result Date: 12/06/2018 If Site Rite image not attached, placement could not be confirmed due to current cardiac rhythm.   Microbiology Recent Results (from the past 240 hour(s))  Culture, respiratory (non-expectorated)     Status: None   Collection Time: 12/08/18  8:17 AM   Specimen: Tracheal Aspirate; Respiratory  Result Value Ref Range Status   Specimen Description   Final    TRACHEAL ASPIRATE Performed at [facility], [address] [place], [place] [address]    Special Requests   Final    Normal Performed at [facility], [address] [place], [place] [address]    Gram Stain   Final    RARE WBC PRESENT, PREDOMINANTLY PMN NO ORGANISMS SEEN Performed at [facility], [address] [place], [place] [address]    Culture FEW STAPHYLOCOCCUS AUREUS  Final   Report Status 12/10/2018 FINAL  Final   Organism ID, Bacteria STAPHYLOCOCCUS AUREUS  Final      Susceptibility    Staphylococcus aureus - MIC*    CIPROFLOXACIN >=8 RESISTANT Resistant     ERYTHROMYCIN 0.5 SENSITIVE Sensitive     GENTAMICIN <=0.5 SENSITIVE Sensitive     OXACILLIN <=0.25 SENSITIVE Sensitive     TETRACYCLINE <=1 SENSITIVE Sensitive     VANCOMYCIN 1 SENSITIVE Sensitive     TRIMETH/SULFA <=10 SENSITIVE Sensitive     CLINDAMYCIN <=0.25 SENSITIVE Sensitive     RIFAMPIN <=0.5 SENSITIVE Sensitive     Inducible Clindamycin NEGATIVE Sensitive     * FEW STAPHYLOCOCCUS AUREUS    Lab Basic Metabolic Panel: Recent Labs  Lab 12/09/18 0445 12/10/18 0440  12/11/18 0435 12/12/18 0649 01/09/19 0324 09-Jan-2019 0416 01-09-2019 0550 January 09, 2019 0601 January 09, 2019 0845  NA 156* 157*   < > 149* 144 140 139 136 138 138  K 5.0 5.0   < > 4.2 5.2* 4.0 3.9 3.1* 3.5 3.4*  CL 116* 116*  --  116* 102  --   --  103  --   --   CO2 34* 35*  --  25 32  --   --  23  --   --   GLUCOSE 131* 128*  --  123* 126*  --   --  186*  --   --   BUN 99* 116*  --  104* 175*  --   --  148*  --   --   CREATININE 1.65* 1.91*  --  2.07* 3.29*  --   --  3.45*  --   --   CALCIUM 8.3* 8.1*  --  5.9* 7.6*  --   --  6.5*  --   --   MG  --   --   --   --  3.2*  --   --  2.8*  --   --   PHOS 3.5  --   --  3.3 5.7*  --   --  7.2*  --   --    < > = values in this interval not displayed.   Liver Function Tests: Recent Labs  Lab 12/08/18 0430 12/09/18 0445 12/11/18 0435 12/12/18 0649  AST 20  --   --  47*  ALT 21  --   --  17  ALKPHOS 63  --   --  63  BILITOT 0.2*  --   --  0.1*  PROT 6.1*  --   --  6.5  ALBUMIN 1.8* 1.7* <1.0* 1.5*   No results for input(s): LIPASE, AMYLASE in the last 168 hours. Recent Labs  Lab 12/12/18 0647 Jan 09, 2019 0547  AMMONIA 82* 76*   CBC: Recent Labs  Lab 12/08/18 0430  12/09/18 0445  12/11/18 0435 12/12/18 0649 Jan 09, 2019 0324 Jan 09, 2019 0416 01/09/2019 0550 01/09/19 0601 01-09-2019 0845  WBC 12.3*  --  13.4*  --  11.7* 14.3*  --   --  12.0*  --   --   HGB 9.8*   < > 9.5*   < > 7.6* 8.4* 9.5*  9.2* 6.4* 8.8* 8.8*  HCT 36.0   < > 35.1*   < > 27.6* 30.9* 28.0* 27.0* 23.1* 26.0* 26.0*  MCV 101.7*  --  102.3*  --  103.0* 101.0*  --   --  100.9*  --   --   PLT 200  --  213  --  192 212  --   --  174  --   --    < > =  values in this interval not displayed.   Cardiac Enzymes: No results for input(s): CKTOTAL, CKMB, CKMBINDEX, TROPONINI in the last 168 hours. Sepsis Labs: Recent Labs  Lab 12/09/18 0445 12/11/18 0435 12/12/18 0649 11/20/2018 0550  WBC 13.4* 11.7* 14.3* 12.0*      Joane Postel 12/14/2018, 2:39 PM

## 2018-12-16 NOTE — Progress Notes (Signed)
NAME:  Irven Coenita P Facey, MRN:  [id number], DOB:  [birth year], LOS: 31 ADMISSION DATE:  10/17/2018, CONSULTATION DATE:  10/16 REFERRING MD:  [name], CHIEF COMPLAINT:  Dyspnea   Brief History   63 year old female admitted on November 12, 2018 in the setting of worsening hypoxemic respiratory failure from COVID-19 pneumonia.  Past Medical History  HTN Cerebral artery aneurysm rupture  Significant Hospital Events   09/28 admit, start decadron/remdesivir 09/29 given tocilizumab in setting of worsening hypoxia; pt deferred option for convalescent plasma 09/30 transfer to ICU, PCCM consulted 10/03 transfer back to PCU 10/16 PCCM reconsulted for presistent hypoxia; restart solumedrol  10/17 transferred back to ICU with AMS and hypoxia, required intubation 10/18 restart remdesivir 10/22 left pneumothorax, chest tube placed  Consults:  PCCM  Procedures:  ETT 10/17 >> Rt radial a line 10/18 >>   Significant Diagnostic Tests:  Doppler b/l legs 10/01 >> no DVT CT angio chest 10/03 >> borderline LAN up to 1.5 cm, b/l patchy consolidation and GGO Echo 10/16 >> EF 70 to 75%, PAS 50 mmHg, RV size and function normal CT head 10/17 >> s/p Lt frontotemporal craniotomy, encephalomalacia Lt temporal lobe, chronic small posterior temporal cortical infarct, Lt aneurysm clipping EEG 10/19 >> mild general background slowing 10/24 bedside echo> dilated RV, LV grossly normal  Micro Data:  SARS COV2 9/28 >> POSITIVE Sputum 10/18 >> Urine 10/18 >> negative  Antimicrobials/COVID Rx:  Remdesivir 9/28 >> 10/02 Solumedrol 9/28 >> 10/07 Tocilizumab 9/29  Solumedrol 10/16 >> 10/18 Remdesivir 10/18 >>  Decadron 10/18 >>   Interim history/subjective:   Worsening respiratory acidosis overnight Minimal response to change in vent settings No response to lasix drip  Objective   Blood pressure (!) 127/51, pulse 87, temperature 99.3 F (37.4 C), temperature source Oral, resp. rate (!) 35, height 5\' 5"   (1.651 m), weight 107.7 kg, SpO2 98 %. CVP:  [11 mmHg-33 mmHg] 22 mmHg  Vent Mode: PRVC FiO2 (%):  [60 %-80 %] 80 % Set Rate:  [35 bmp] 35 bmp Vt Set:  [450 mL-500 mL] 500 mL PEEP:  [10 cmH20-12 cmH20] 10 cmH20 Plateau Pressure:  [21 cmH20-47 cmH20] 47 cmH20   Intake/Output Summary (Last 24 hours) at 03/28/18 0942 Last data filed at 03/28/18 0900 Gross per 24 hour  Intake 3909.92 ml  Output 750 ml  Net 3159.92 ml   Filed Weights   12/09/18 0500 12/10/18 0500 06-19-2018 0500  Weight: 95.3 kg 95.4 kg 107.7 kg   Examination:  General:  Acute on chronically ill appearing female, sedate, tachypneic HENT: [place]/AT, PERRL, EOM-none and MMM, ETT in place PULM: Coarse BS diffusely CV: RRR, Nl S1/S2 and -M/R/G GI: Soft, NT, ND and +BS MSK: normal bulk and tone Neuro: Non-purposeful opening of eyes, not following commands  I reviewed CXR myself, ETT and CT in a good position, infiltrate noted  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 Continue full vent support Target TVol 6-8cc/kgIBW Target Plateau Pressure < 30cm H20 Target driving pressure less than 15 cm of water Target PaO2 55-65: titrate PEEP/FiO2 per protocol As long as PaO2 to FiO2 ratio is less than 1:150 position in prone position for 16 hours a day, will continue proning Check CVP via PICC  Target CVP less than 4, diurese as necessary Ventilator associated pneumonia prevention protocol No need for further proning at this time Will likely progress down to the comfort route as patient now developed MODS and we are unable to ventilate any further with pH persistently in the 7.1 range that is  mostly respiratory Titrate O2 for sat of 85% D/C lasix drip Doubt a dialysis candidate but will defer to nephrology Replace electrolytes as indicated Unable to adjust vent farther given high pressure and high minute ventilation already, unsafe to bronch to evaluate if any airway obstruction and no PTX on CXR this AM  Shock/hypotension: mild, sedation related Continue levophed Hold further diureses Continue stress dose steroid  Pneumothorax Some serous drainage around site Continue chest tube to water seal Repeat CXR in AM  Acute metabolic encephalopathy, was able to talk just prior to intubation so suspect most of her encephalopathy now is sedation related Severe ventilator dyssynchrony Hold all oral sedatives RASS target -1 to -2 during most of daytime WUA tomorrow morning Would be cautious labeling her as anoxic brain injury as she was speaking prior to intubation Precedex ordered  AKI: improving, with hypernatremia Continue free water Monitor BMET and UOP Replace electrolytes as needed See diureses orders above If above is ineffective will consult renal in AM  I had an extensive conversation with the patient's daughter and mother over the phone.  After a long discussion, I informed them that dialysis would only prolong her life temporarily but that the outcome will be the same and we would have to put in a dialysis catheter.  After a long discussion decision was made to proceed with comfort measures only.  RN's will call the unit for time of arrival and once here they will be able to see the patient in the visitation room the will bring back to the unit and terminally extubate.  They were agreeable to a full DNR and will place the withdrawal orderset but extubate when family is ready.  Best practice:  Diet: tube feeding Pain/Anxiety/Delirium protocol (if indicated): yes, RASS target -1 to -2 VAP protocol (if indicated): yes DVT prophylaxis: lovenox per protocol GI  prophylaxis: pantoprazole Glucose control: per [facility] Mobility: bed rest Code Status: full Family Communication: per [facility] Disposition: remain in ICU  Labs   CBC: Recent Labs  Lab 12/08/18 0430  12/09/18 0445  12/11/18 0435 12/12/18 0649 12/08/2018 0324 12/09/2018 0416 11/29/2018 0550 11/18/2018 0601 11/21/2018 0845  WBC 12.3*  --  13.4*  --  11.7* 14.3*  --   --  12.0*  --   --   HGB 9.8*   < > 9.5*   < > 7.6* 8.4* 9.5* 9.2* 6.4* 8.8* 8.8*  HCT 36.0   < > 35.1*   < > 27.6* 30.9* 28.0* 27.0* 23.1* 26.0* 26.0*  MCV 101.7*  --  102.3*  --  103.0* 101.0*  --   --  100.9*  --   --   PLT 200  --  213  --  192 212  --   --  174  --   --    < > = values in this interval not displayed.    Basic Metabolic Panel: Recent Labs  Lab 12/07/18 0408  12/09/18 0445 12/10/18 0440  12/11/18 0435 12/12/18 0649 12/11/2018 0324 11/17/2018 0416 12/05/2018 0550 12/06/2018 0601 11/17/2018 0845  NA 152*   < > 156* 157*   < > 149* 144 140 139 136 138 138  K 5.7*   < >  5.0 5.0   < > 4.2 5.2* 4.0 3.9 3.1* 3.5 3.4*  CL 117*   < > 116* 116*  --  116* 102  --   --  103  --   --   CO2 32   < > 34* 35*  --  25 32  --   --  23  --   --   GLUCOSE 201*   < > 131* 128*  --  123* 126*  --   --  186*  --   --   BUN 96*   < > 99* 116*  --  104* 175*  --   --  148*  --   --   CREATININE 1.59*   < > 1.65* 1.91*  --  2.07* 3.29*  --   --  3.45*  --   --   CALCIUM 8.3*   < > 8.3* 8.1*  --  5.9* 7.6*  --   --  6.5*  --   --   MG 3.0*  --   --   --   --   --  3.2*  --   --  2.8*  --   --   PHOS  --   --  3.5  --   --  3.3 5.7*  --   --  7.2*  --   --    < > = values in this interval not displayed.   GFR: Estimated Creatinine Clearance: 20.4 mL/min (A) (by C-G formula based on SCr of 3.45 mg/dL (H)). Recent Labs  Lab 12/07/18 1400  12/09/18 0445 12/11/18 0435 12/12/18 0649 12-25-18 0550  WBC  --    < > 13.4* 11.7* 14.3* 12.0*  LATICACIDVEN 0.4*  --   --   --   --   --    < > = values in this interval not displayed.     Liver Function Tests: Recent Labs  Lab 12/07/18 0408 12/08/18 0430 12/09/18 0445 12/11/18 0435 12/12/18 0649  AST 20 20  --   --  47*  ALT 23 21  --   --  17  ALKPHOS 71 63  --   --  63  BILITOT 0.3 0.2*  --   --  0.1*  PROT 6.3* 6.1*  --   --  6.5  ALBUMIN 2.0* 1.8* 1.7* <1.0* 1.5*   No results for input(s): LIPASE, AMYLASE in the last 168 hours. Recent Labs  Lab 12/12/18 0647 2018-12-25 0547  AMMONIA 82* 76*    ABG    Component Value Date/Time   PHART 7.158 (LL) December 25, 2018 0845   PCO2ART 78.6 (HH) 2018/12/25 0845   PO2ART 66.0 (L) 12/25/2018 0845   HCO3 27.8 12/25/18 0845   TCO2 30 25-Dec-2018 0845   ACIDBASEDEF 2.0 2018-12-25 0845   O2SAT 85.0 Dec 25, 2018 0845     Coagulation Profile: No results for input(s): INR, PROTIME in the last 168 hours.  Cardiac Enzymes: No results for input(s): CKTOTAL, CKMB, CKMBINDEX, TROPONINI in the last 168 hours.  HbA1C: Hemoglobin A1C  Date/Time Value Ref Range Status  05/15/2015 03:52 PM 5.5  Final  04/25/2014 04:43 PM 5.8  Final   Hgb A1c MFr Bld  Date/Time Value Ref Range Status  12/02/2018 08:38 AM 6.4 (H) 4.8 - 5.6 % Final    Comment:    (NOTE)         Prediabetes: 5.7 - 6.4         Diabetes: >6.4  Glycemic control for adults with diabetes: <7.0     CBG: Recent Labs  Lab 12/12/18 1123 12/12/18 1720 12/12/18 2008 12/12/18 2351 12/27/2018 0808  GLUCAP 131* 112* 149* 152* 155*   The patient is critically ill with multiple organ systems failure and requires high complexity decision making for assessment and support, frequent evaluation and titration of therapies, application of advanced monitoring technologies and extensive interpretation of multiple databases.   Critical Care Time devoted to patient care services described in this note is  90  Minutes. This time reflects time of care of this signee Dr Jennet Maduro. This critical care time does not reflect procedure time, or teaching time or supervisory  time of PA/NP/Med student/Med Resident etc but could involve care discussion time.  Rush Farmer, M.D. [facility] Pulmonary/Critical Care Medicine.

## 2018-12-16 NOTE — Progress Notes (Addendum)
[facility] Progress Note Patient Name: Shelley Allison DOB: November 24, 1955 MRN: [id number]   Date of Service  12/04/2018  HPI/Events of Note  Notified of ABG results - 7.12/92.9/pO2 49 on PRVC 450, 35, PEEP 10, 60% FiO2  eICU Interventions  Increase TV to 500.  FiO2 increased to 80%. Check ABG in 1 hour.      Intervention Category Intermediate Interventions: Diagnostic test evaluation  Elsie Lincoln 11/21/2018, 4:54 AM   6:26 AM Repeat ABG only marginally changed- 7.146/87.2/74 on PEEP 10, 80%.  Pt is only on fentanyl at 50.   Plan> Advised RN to increase sedation as vent asynchrony is likely. Repeat ABG this morning.

## 2018-12-16 NOTE — Progress Notes (Signed)
Nutrition Follow-up  DOCUMENTATION CODES:   Not applicable  INTERVENTION:    Increase Pivot 1.5 to 60 ml/hr (1440 ml/day) via Cortrak tube Decrease Prostat to 30 ml Prostat BID  Provides: 2360 kcal, 165 grams protein, and 1092 ml free water. Total free water: 4292 ml     NUTRITION DIAGNOSIS:   Increased nutrient needs related to acute illness as evidenced by estimated needs.  Ongoing   GOAL:   Patient will meet greater than or equal to 90% of their needs  Progressing  MONITOR:   PO intake, Supplement acceptance  ASSESSMENT:   63 yo female admitted with N/V/D and SOB which began on 9/27 after attending a big wedding. COVID-19 positive in the ED. PMH includes HTN, cerebral artery aneurysm rupture.   Pt discussed during ICU rounds and with RN. CCM to discuss goals of care, may need CRRT.     9/28 admitted 10/17 intubated 10/18 restarted remdesivir, decadron 10/21 cortrak placed (tip in stomach) 10/22 prone position resumed; chest tube placed   Patient is currently intubated on ventilator support MV: 15.3 L/min Temp (24hrs), Avg:98.9 F (37.2 C), Min:98.2 F (36.8 C), Max:99.3 F (37.4 C)  Propofol off  Labs reviewed: PO4: 7.2 (H) Medications reviewed: folic acid, solu-cortef, Levo 2 12 mcg 400 ml free water every 6 hours = 3200 ml  Chest tube: 150 ml   Pivot 1.5 @ 35 ml/hr (840 ml/day) via Cortrak tube Continue 60 ml Prostat TID Provides: 1860 kcal, 168 grams protein, and 637 ml free water.   Diet Order:   Diet Order    None      EDUCATION NEEDS:   Not appropriate for education at this time  Skin:  Skin Assessment: Reviewed RN Assessment  Last BM:  10/29  Height:   Ht Readings from Last 1 Encounters:  12/03/18 5\' 5"  (1.651 m)    Weight:   Wt Readings from Last 1 Encounters:  12/04/2018 107.7 kg    Ideal Body Weight:  56.8 kg  BMI:  Body mass index is 39.52 kg/m.  Estimated Nutritional Needs:   Kcal:  2100-2400  Protein:   130-174 grams  Fluid:  >/= 1.8 L  Maylon Peppers RD, LDN, CNSC [phone] Pager [phone] After Hours Pager

## 2018-12-16 NOTE — Progress Notes (Signed)
PROGRESS NOTE  Shelley Allison JKK:[id number] DOB: [birth year] DOA: 12-11-2018  PCP: Richarda Osmond, DO  Brief History/Interval Summary: 71IR with a history of HTN and cerebral artery aneurysm rupture who presented to the ED 9/28 with 1 week of nausea vomiting and diarrhea followed by progressive shortness of breath with fever which began on 9/27.  She had attended a large wedding prior to the onset of her symptoms.  In the ED she was found to be hypoxic and confirmed to be SARS-CoV-2 positive.  Chest x-ray revealed bilateral infiltrates.  Reason for Visit: Pneumonia due to COVID-19.  Acute respiratory failure with hypoxia  Consultants: Pulmonology  Procedures:  Transthoracic echocardiogram  1. Left ventricular ejection fraction, by visual estimation, is 70 to 75%. The left ventricle has hyperdynamic function. Normal left ventricular size. There is mildly increased left ventricular hypertrophy.  2. Left ventricular diastolic Doppler parameters are consistent with impaired relaxation pattern of LV diastolic filling.  3. Global right ventricle has normal systolic function.The right ventricular size is normal. No increase in right ventricular wall thickness.  4. Left atrial size was normal.  5. Right atrial size was normal.  6. The mitral valve is grossly normal. Trace mitral valve regurgitation.  7. The tricuspid valve is not well visualized. Tricuspid valve regurgitation was not assessed by color flow Doppler.  8. The aortic valve is grossly normal Aortic valve regurgitation was not visualized by color flow Doppler. Mild aortic valve sclerosis without stenosis.  9. The pulmonic valve was grossly normal. Pulmonic valve regurgitation is not visualized by color flow Doppler. 10. The inferior vena cava is normal in size with greater than 50% respiratory variability, suggesting right atrial pressure of 3 mmHg.   Significant Events: 9/21 approximate onset of GI symptoms 9/27 onset of  pulmonary symptoms 9/28 admit  9/30 transfer to ICU 10/3 transfer back to PCU 10/16 TTE 10/17 transfer to ICU with respiratory distress/obtundation -intubation 10/22 small R pneumothorax -chest tube placed  Antibiotics: Anti-infectives (From admission, onward)   Start     Dose/Rate Route Frequency Ordered Stop   12/01/2018 2200  ceFAZolin (ANCEF) IVPB 1 g/50 mL premix     1 g 100 mL/hr over 30 Minutes Intravenous Every 12 hours 12/03/2018 0908     12/10/18 1200  ceFAZolin (ANCEF) IVPB 1 g/50 mL premix  Status:  Discontinued     1 g 100 mL/hr over 30 Minutes Intravenous Every 8 hours 12/10/18 1144 11/15/2018 0908   12/03/18 1600  remdesivir 100 mg in sodium chloride 0.9 % 250 mL IVPB     100 mg 500 mL/hr over 30 Minutes Intravenous Every 24 hours 12/02/18 1914 12/06/18 1739   12/02/18 2030  remdesivir 100 mg in sodium chloride 0.9 % 250 mL IVPB     100 mg 500 mL/hr over 30 Minutes Intravenous  Once 12/02/18 1914 12/03/18 0817   11/13/18 1600  remdesivir 100 mg in sodium chloride 0.9 % 250 mL IVPB     100 mg 500 mL/hr over 30 Minutes Intravenous Every 24 hours 12-11-18 2057 11/16/18 1845   11/13/18 1200  azithromycin (ZITHROMAX) 500 mg in sodium chloride 0.9 % 250 mL IVPB     500 mg 250 mL/hr over 60 Minutes Intravenous  Once 11-Dec-2018 1837 11/13/18 1340   11/13/18 1200  cefTRIAXone (ROCEPHIN) 1 g in sodium chloride 0.9 % 100 mL IVPB  Status:  Discontinued     1 g 200 mL/hr over 30 Minutes Intravenous Every 24 hours 12/11/2018 1837  11/16/18 1334   11-25-2018 2200  remdesivir 200 mg in sodium chloride 0.9 % 250 mL IVPB     200 mg 500 mL/hr over 30 Minutes Intravenous Once Nov 25, 2018 2057 25-Nov-2018 2213   11/25/18 1415  cefTRIAXone (ROCEPHIN) 1 g in sodium chloride 0.9 % 100 mL IVPB     1 g 200 mL/hr over 30 Minutes Intravenous  Once Nov 25, 2018 1402 11-25-18 1517   11-25-2018 1415  azithromycin (ZITHROMAX) 500 mg in sodium chloride 0.9 % 250 mL IVPB     500 mg 250 mL/hr over 60 Minutes Intravenous   Once 11/25/18 1402 Nov 25, 2018 1736      Subjective/Interval History: Patient is intubated and sedated.    Assessment/Plan:  Acute Hypoxic Resp. Failure/Pneumonia due to COVID-19  Vent Mode: PRVC FiO2 (%):  [60 %-80 %] 80 % Set Rate:  [35 bmp] 35 bmp Vt Set:  [450 mL-500 mL] 500 mL PEEP:  [10 cmH20] 10 cmH20 Plateau Pressure:  [21 cmH20-47 cmH20] 47 cmH20     Component Value Date/Time   PHART 7.158 (LL) 12/08/2018 0845   PCO2ART 78.6 (HH) 12/08/2018 0845   PO2ART 66.0 (L) 11/25/2018 0845   HCO3 27.8 12/11/2018 0845   TCO2 30 11/28/2018 0845   ACIDBASEDEF 2.0 12/14/2018 0845   O2SAT 85.0 12/14/2018 0845    Lab Results  Component Value Date   SARSCOV2NAA POSITIVE (A) 11-25-18   COVID-19 specific Treatment: Remdesivir 9/28 >10/2 Solumedrol 9/28 > 10/7 Actemra 9/29 Convalescent plasma - patient declined Solumedrol 10/16 > 10/18 Remdesivir 10/18 >10/22 Decadron 10/18 >10/22  From a COVID-19 standpoint patient has had 2 courses of remdesivir and steroids.  CT angiogram of the chest was negative for PE.  Lower extremity Dopplers are negative for DVT.  Pulmonology is following for ventilator management.  Noted to have significant respiratory acidosis today.  Patient seems to be declining.  Discussed with critical care medicine.  They will talk to family about perhaps transitioning to comfort care.  They will also discuss with nephrology.  Small right-sided pneumothorax Apparent on chest x-ray from 10/22.  Chest tube placed by PCCM.  Management per them.  Staph aureus pneumonia Tracheal aspirate from 10/25 grew staph aureus.  Continue current antibiotic treatment with cefazolin.  Acute kidney injury Patient's creatinine continues to rise.  She is not making much urine either.  Patient has been on furosemide due to third spacing and volume overload.  Placed on an infusion yesterday.  Considering no significant response may need to consult nephrology for CRRT.  Critical  care medicine to discuss with nephrology.  Prognosis seems to be guarded at this time.  Acute metabolic encephalopathy Likely due to hypoxia and acute illness.  EEG did not show any seizure activity.  CT head was unrevealing.  Will need to see how she does off of sedation when she is more stable.  Macrocytic anemia/ folic acid deficiency Hemoglobin noted to be lower today compared to yesterday.  No obvious evidence for bleeding.  Folic acid being repleted.  Will consider blood transfusion depending on results of discussions with family today.   Hypotension Blood pressure is reasonably well controlled.  She was requiring pressors previously.  Currently off of Levophed.  Hyperkalemia Patient was given [facility] yesterday.  Potassium noted to be lower today.  Hypernatremia Improved.  Continue free water.  Transaminitis Secondary to COVID-19.  Improved.  History of ruptured cerebral artery aneurysm  FEN Not on fluids.  Continue tube feedings.  Monitor electrolytes.    Diarrhea Has  been having significant watery stools.  Will hold her lactulose for today.   Obesity Estimated body mass index is 39.52 kg/m as calculated from the following:   Height as of this encounter:  (1.651 m).   Weight as of this encounter: 107.7 kg.   DVT Prophylaxis: Subcutaneous heparin at high dose PUD Prophylaxis: Protonix Code Status: Partial code Family Communication: Pulmonology has discussed with family member Disposition Plan: Remain in ICU for now   Medications:  Scheduled: . chlorhexidine  15 mL Mouth/Throat BID  . Chlorhexidine Gluconate Cloth  6 each Topical Daily  . feeding supplement (PRO-STAT SUGAR FREE 64)  60 mL Per Tube TID  . folic acid  1 mg Per Tube Daily  . free water  400 mL Per Tube Q3H  . heparin injection (subcutaneous)  10,000 Units Subcutaneous Q8H  . hydrocortisone sodium succinate  50 mg Intravenous Q6H  . insulin aspart  0-15 Units Subcutaneous Q4H  . mouth rinse   15 mL Mouth Rinse 10 times per day  . pantoprazole sodium  40 mg Per Tube Daily  . sodium chloride flush  10-40 mL Intracatheter Q12H   Continuous: . sodium chloride Stopped (12/03/18 1459)  .  ceFAZolin (ANCEF) IV    . dexmedetomidine (PRECEDEX) IV infusion Stopped (12/12/18 0840)  . feeding supplement (PIVOT 1.5 CAL) 35 mL/hr at 11/25/2018 0600  . fentaNYL infusion INTRAVENOUS Stopped (12/08/2018 0911)  . norepinephrine (LEVOPHED) Adult infusion Stopped (12/10/18 1253)   ONG:EXBMWU chloride, acetaminophen, albuterol, artificial tears, bisacodyl, docusate, fentaNYL, hydrALAZINE, metoprolol tartrate, midazolam, [DISCONTINUED] ondansetron **OR** ondansetron (ZOFRAN) IV, sodium chloride flush   Objective:  Vital Signs  Vitals:   11/17/2018 0700 12/07/2018 0727 12/07/2018 0800 11/25/2018 0900  BP: (!) 129/53 (!) 129/53 (!) 117/51 (!) 127/51  Pulse: 87 93 86 87  Resp: (!) 35 (!) 33 (!) 35 (!) 35  Temp:   99.3 F (37.4 C)   TempSrc:   Oral   SpO2: 97% 97% 98% 98%  Weight:      Height:        Intake/Output Summary (Last 24 hours) at 11/26/2018 1304 Last data filed at 12/14/2018 0900 Gross per 24 hour  Intake 3769.92 ml  Output 500 ml  Net 3269.92 ml   Filed Weights   12/09/18 0500 12/10/18 0500 11/29/2018 0500  Weight: 95.3 kg 95.4 kg 107.7 kg    General appearance: Intubated and sedated Resp: Coarse breath sounds bilaterally with crackles at the bases.  No wheezing or rhonchi. Cardio: S1-S2 is normal regular.  No S3-S4.  No rubs murmurs or bruit GI: Abdomen is soft.  Nontender nondistended.  Bowel sounds are present normal.  No masses organomegaly Extremities: Edematous upper and lower extremities. Neurologic: Intubated and sedated    Lab Results:  Data Reviewed: I have personally reviewed following labs and imaging studies  CBC: Recent Labs  Lab 12/08/18 0430  12/09/18 0445  12/11/18 0435 12/12/18 0649 12/06/2018 0324 12/02/2018 0416 12/11/2018 0550 12/14/2018 0601 12/03/2018  0845  WBC 12.3*  --  13.4*  --  11.7* 14.3*  --   --  12.0*  --   --   HGB 9.8*   < > 9.5*   < > 7.6* 8.4* 9.5* 9.2* 6.4* 8.8* 8.8*  HCT 36.0   < > 35.1*   < > 27.6* 30.9* 28.0* 27.0* 23.1* 26.0* 26.0*  MCV 101.7*  --  102.3*  --  103.0* 101.0*  --   --  100.9*  --   --  PLT 200  --  213  --  192 212  --   --  174  --   --    < > = values in this interval not displayed.    Basic Metabolic Panel: Recent Labs  Lab 12/07/18 0408  12/09/18 0445 12/10/18 0440  12/11/18 0435 12/12/18 6384 2018-12-25 0324 Dec 25, 2018 0416 2018-12-25 0550 12/25/2018 0601 December 25, 2018 0845  NA 152*   < > 156* 157*   < > 149* 144 140 139 136 138 138  K 5.7*   < > 5.0 5.0   < > 4.2 5.2* 4.0 3.9 3.1* 3.5 3.4*  CL 117*   < > 116* 116*  --  116* 102  --   --  103  --   --   CO2 32   < > 34* 35*  --  25 32  --   --  23  --   --   GLUCOSE 201*   < > 131* 128*  --  123* 126*  --   --  186*  --   --   BUN 96*   < > 99* 116*  --  104* 175*  --   --  148*  --   --   CREATININE 1.59*   < > 1.65* 1.91*  --  2.07* 3.29*  --   --  3.45*  --   --   CALCIUM 8.3*   < > 8.3* 8.1*  --  5.9* 7.6*  --   --  6.5*  --   --   MG 3.0*  --   --   --   --   --  3.2*  --   --  2.8*  --   --   PHOS  --   --  3.5  --   --  3.3 5.7*  --   --  7.2*  --   --    < > = values in this interval not displayed.    GFR: Estimated Creatinine Clearance: 20.4 mL/min (A) (by C-G formula based on SCr of 3.45 mg/dL (H)).  Liver Function Tests: Recent Labs  Lab 12/07/18 0408 12/08/18 0430 12/09/18 0445 12/11/18 0435 12/12/18 0649  AST 20 20  --   --  47*  ALT 23 21  --   --  17  ALKPHOS 71 63  --   --  63  BILITOT 0.3 0.2*  --   --  0.1*  PROT 6.3* 6.1*  --   --  6.5  ALBUMIN 2.0* 1.8* 1.7* <1.0* 1.5*    Recent Labs  Lab 12/12/18 0647 12-25-2018 0547  AMMONIA 82* 76*     CBG: Recent Labs  Lab 12/12/18 2008 12/12/18 2351 2018-12-25 0453 12/25/2018 0808 12/25/2018 1200  GLUCAP 149* 152* 158* 155* 188*     Recent Results (from the past  240 hour(s))  Culture, respiratory (non-expectorated)     Status: None   Collection Time: 12/08/18  8:17 AM   Specimen: Tracheal Aspirate; Respiratory  Result Value Ref Range Status   Specimen Description   Final    TRACHEAL ASPIRATE Performed at [facility], [address], [place], [place] [address]    Special Requests   Final    Normal Performed at [facility], [address] [place], [place] [address]    Gram Stain   Final    RARE WBC PRESENT, PREDOMINANTLY PMN NO ORGANISMS SEEN Performed at [facility],  [address] [place], [place] [address]    Culture FEW STAPHYLOCOCCUS AUREUS  Final   Report Status 12/10/2018 FINAL  Final   Organism ID, Bacteria STAPHYLOCOCCUS AUREUS  Final      Susceptibility   Staphylococcus aureus - MIC*    CIPROFLOXACIN >=8 RESISTANT Resistant     ERYTHROMYCIN 0.5 SENSITIVE Sensitive     GENTAMICIN <=0.5 SENSITIVE Sensitive     OXACILLIN <=0.25 SENSITIVE Sensitive     TETRACYCLINE <=1 SENSITIVE Sensitive     VANCOMYCIN 1 SENSITIVE Sensitive     TRIMETH/SULFA <=10 SENSITIVE Sensitive     CLINDAMYCIN <=0.25 SENSITIVE Sensitive     RIFAMPIN <=0.5 SENSITIVE Sensitive     Inducible Clindamycin NEGATIVE Sensitive     * FEW STAPHYLOCOCCUS AUREUS      Radiology Studies: Dg Chest Port 1 View  Result Date: Apr 17, 2018 CLINICAL DATA:  ET tube EXAM: PORTABLE CHEST 1 VIEW COMPARISON:  12/12/2018 FINDINGS: Support devices are stable. Left chest tube in place. No pneumothorax. Diffuse bilateral airspace disease similar to prior study. No effusions or pneumothorax. No acute bony abnormality. IMPRESSION: Stable support devices.  No pneumothorax. Diffuse bilateral airspace disease, unchanged. Electronically Signed   By: Charlett NoseKevin  Dover M.D.   On: Apr 17, 2018 08:46   Dg Chest Port 1 View  Result Date: 12/12/2018 CLINICAL DATA:  Endotracheal tube placement. EXAM: PORTABLE CHEST 1 VIEW COMPARISON:  December 10, 2018.  FINDINGS: Stable cardiomediastinal silhouette. Endotracheal and feeding tubes are unchanged in position. Left-sided chest tube is noted without pneumothorax. Right-sided PICC line is unchanged. Stable bilateral lung opacities are noted concerning for multifocal pneumonia. IMPRESSION: Stable support apparatus. Stable left-sided chest tube without pneumothorax. Stable bilateral lung opacities consistent with multifocal pneumonia. Electronically Signed   By: Lupita RaiderJames  Green Jr M.D.   On: 12/12/2018 07:42       LOS: 31 days   Keelia Graybill [employer]  [facility] Pager on [email or web address]  Apr 17, 2018, 1:04 PM

## 2018-12-16 DEATH — deceased

## 2018-12-17 LAB — TYPE AND SCREEN
ABO/RH(D): A POS
Antibody Screen: POSITIVE
Donor AG Type: NEGATIVE
PT AG Type: NEGATIVE
Unit division: 0

## 2018-12-17 LAB — BPAM RBC
Blood Product Expiration Date: 202011202359
Unit Type and Rh: 6200

## 2020-06-12 IMAGING — DX DG CHEST 1V PORT
1 series · 1 of 1 positions shown · non-contrast
Comparison: 11/26/2018

CLINICAL DATA: [id number], shortness of breath

EXAM:
PORTABLE CHEST 1 VIEW

[chest]
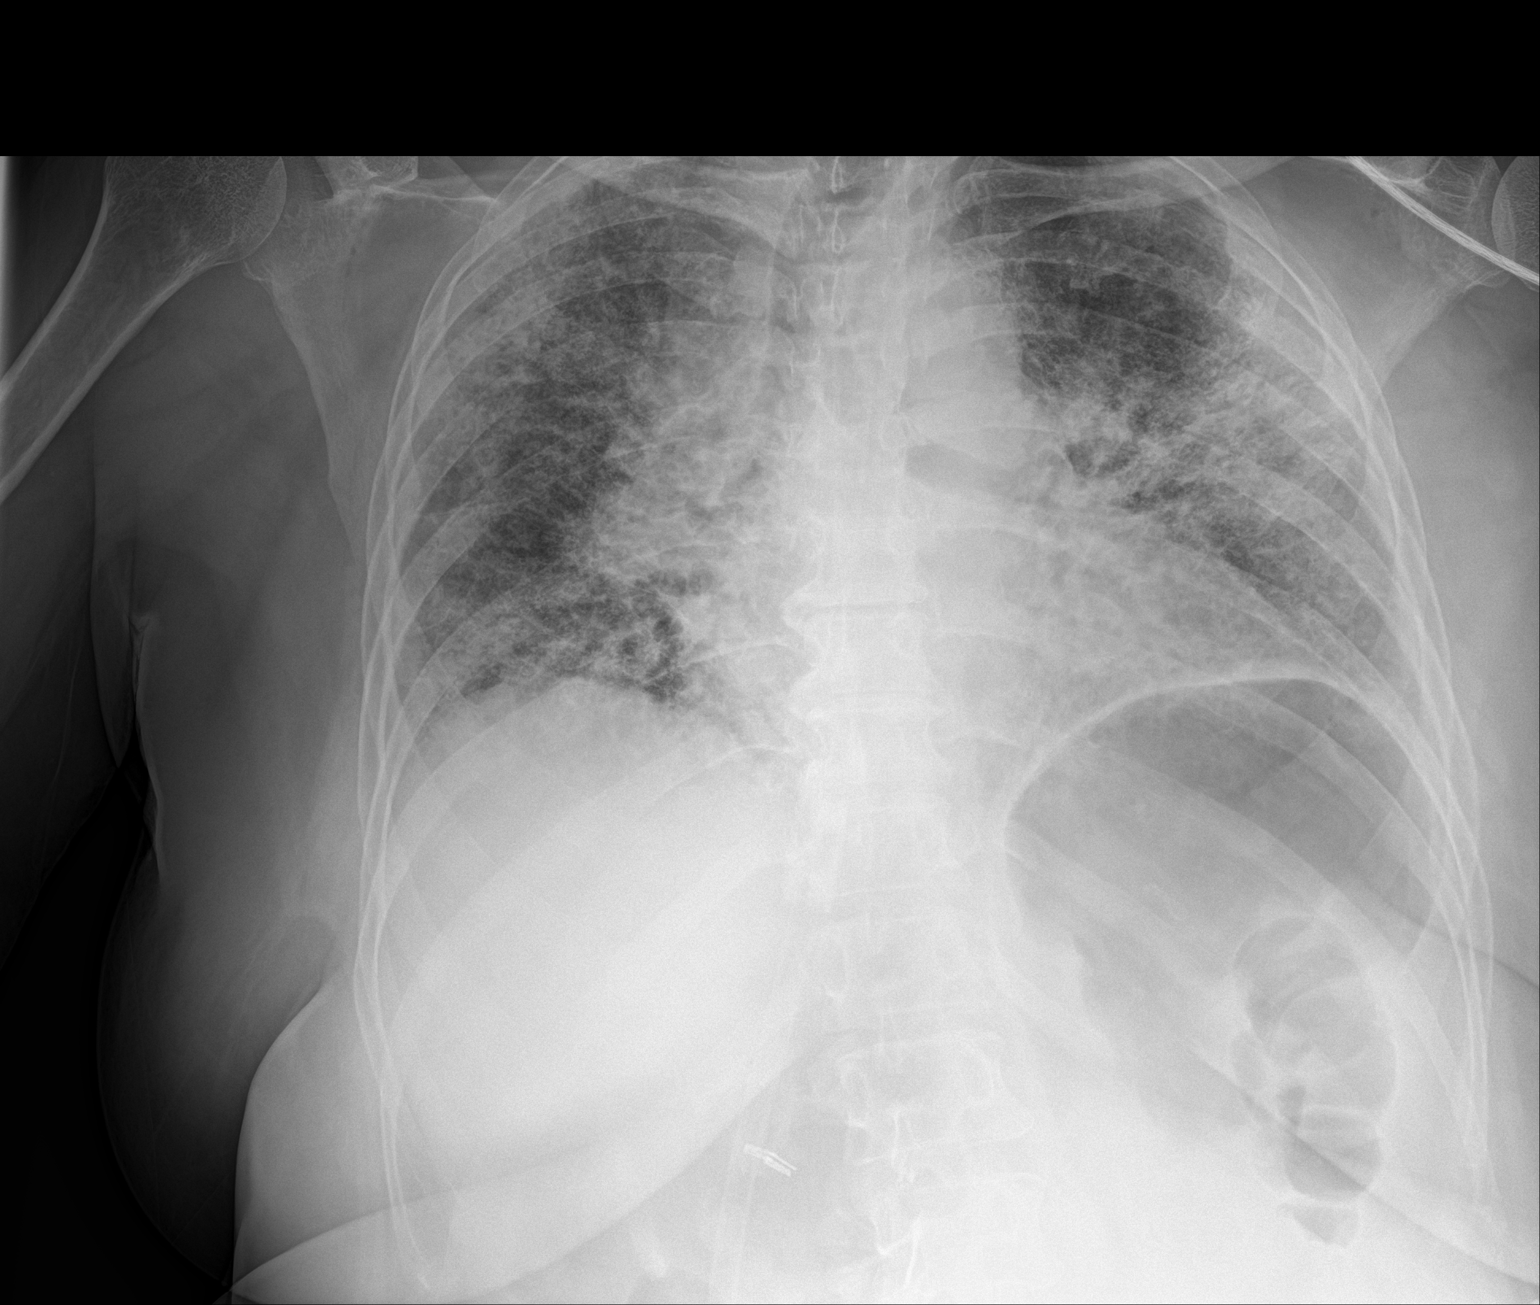

[1 of 1 positions shown; findings below may reference images not displayed]

FINDINGS: No significant interval change in AP portable examination with
extensive, diffuse heterogeneous and interstitial pulmonary opacity,
most dense and consolidative appearing in the left mid lung and
peripheral right upper lobe. No new opacity. The heart and
mediastinum are unremarkable.
IMPRESSION: No significant interval change in AP portable examination with
extensive, diffuse heterogeneous and interstitial pulmonary opacity,
most dense and consolidative appearing in the left mid lung and
peripheral right upper lobe. Findings are consistent with 9YA26-[id number]
infection. No new opacity.

## 2020-06-15 IMAGING — DX DG CHEST 1V PORT
1 series · 1 of 1 positions shown · non-contrast
Comparison: 12/01/2018.

CLINICAL DATA: Covert positive.  Intubation.

EXAM:
PORTABLE CHEST 1 VIEW

[chest ap]
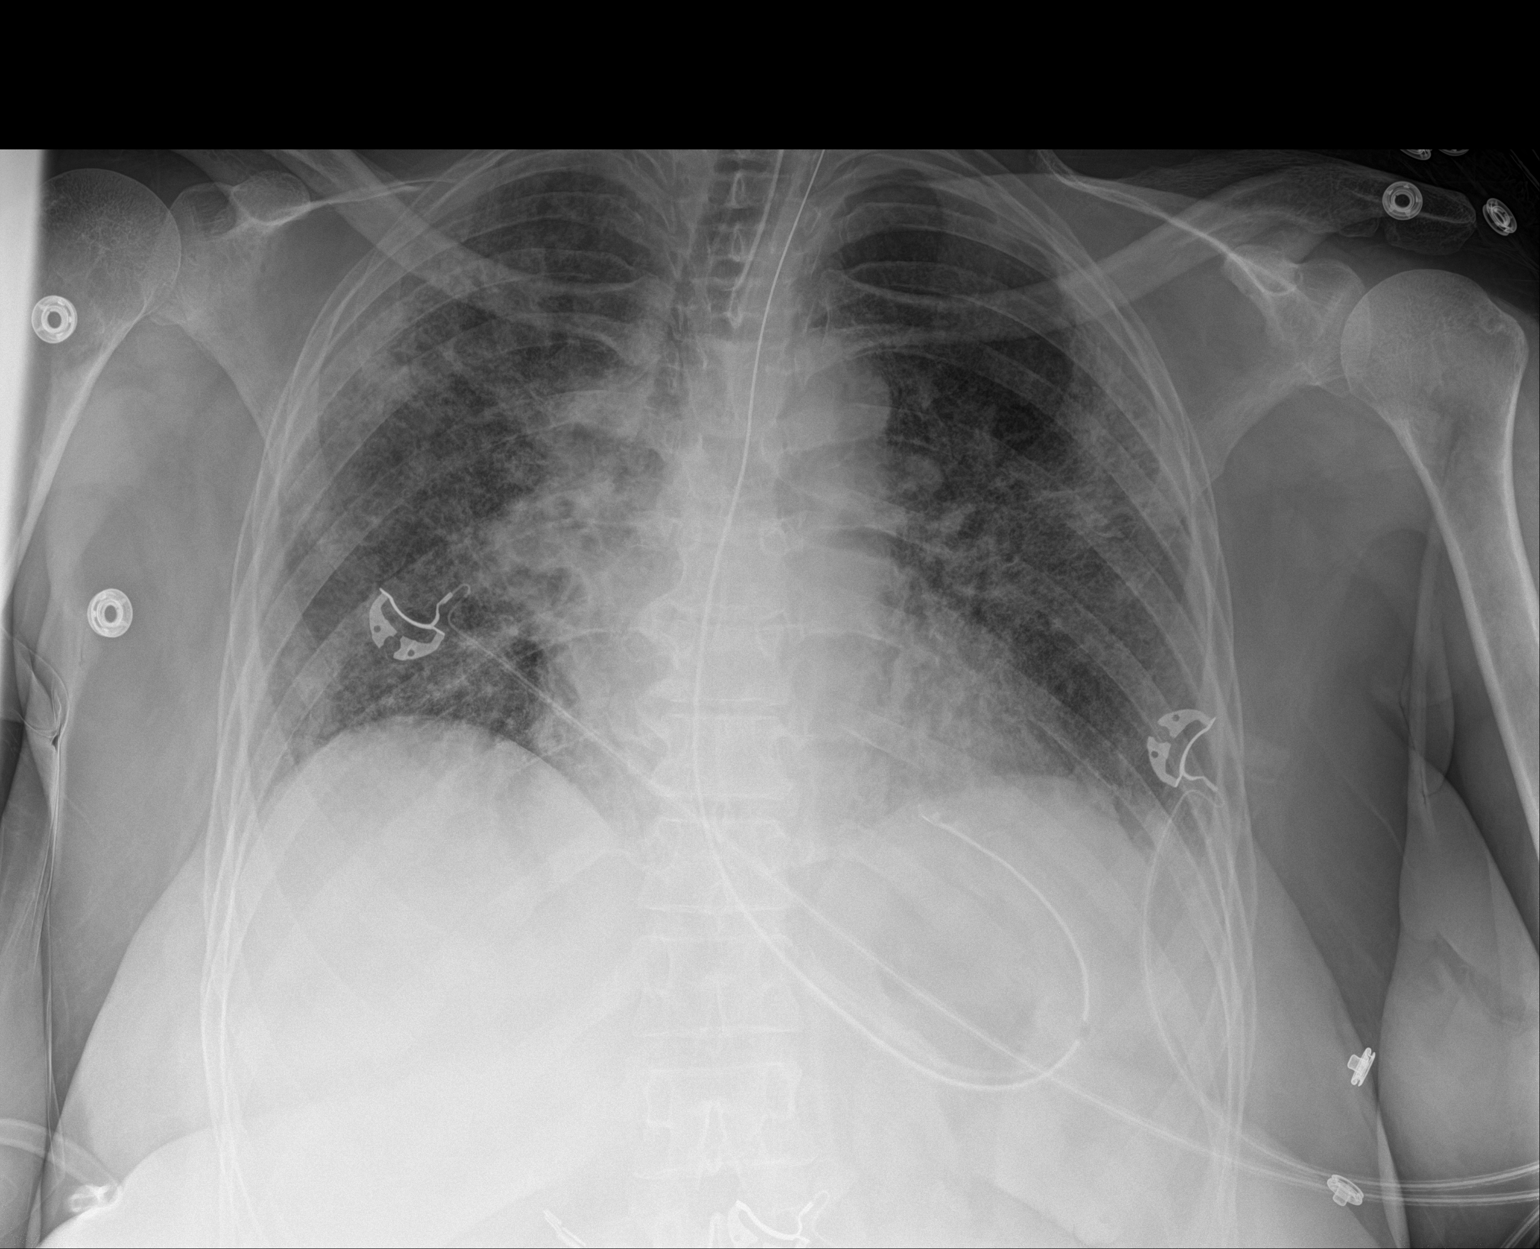

[1 of 1 positions shown; findings below may reference images not displayed]

FINDINGS: Endotracheal tube in stable position. NG tube noted with tip coiled
stomach. Heart size normal. Persistent bilateral pulmonary
infiltrates, no interim change. No prominent pleural effusion. No
pneumothorax.
IMPRESSION: 1. Endotracheal tube in stable position. NG tube noted with tip
coiled in stomach.

2.  Persistent bilateral pulmonary infiltrates, no interim change.

## 2020-06-17 IMAGING — DX DG CHEST 1V PORT
1 series · 1 of 1 positions shown · non-contrast
Comparison: Two days ago

CLINICAL DATA: Respiratory failure

EXAM:
PORTABLE CHEST 1 VIEW

[chest ap]
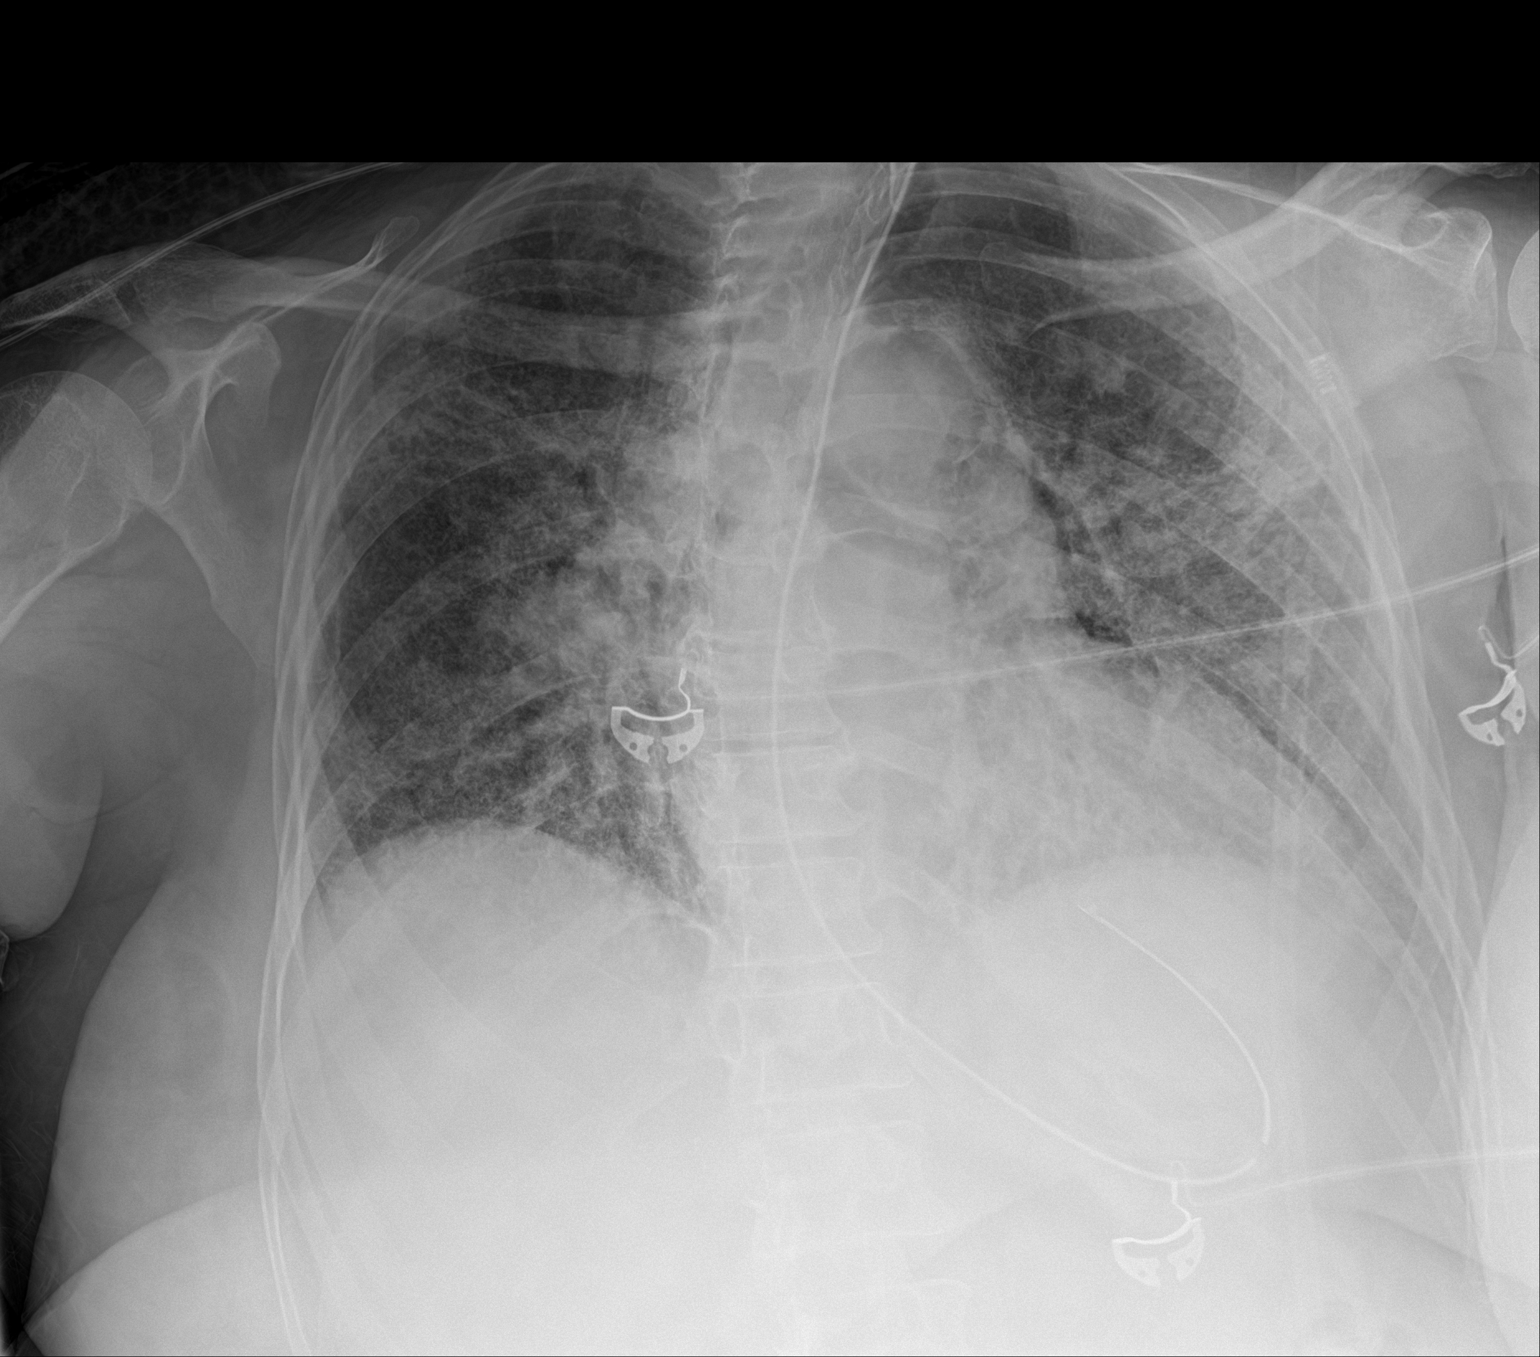

[1 of 1 positions shown; findings below may reference images not displayed]

FINDINGS: Higher endotracheal tube with tip 2.5 cm above the clavicular heads
and 7.5 cm above the carina. The orogastric tube remains in stable
position. Unchanged bilateral interstitial and airspace infiltrates.
Mild cardiomegaly. No visible effusion or pneumothorax.

These results will be called to the ordering clinician or
representative by the Radiologist Assistant, and communication
documented in the PACS or zVision Dashboard.
IMPRESSION: 1. Higher endotracheal tube with tip above the clavicular heads and
7.5 cm above the carina. Consider tube advancement
2. Stable atypical pneumonia.

## 2020-06-18 IMAGING — DX DG CHEST 1V PORT
1 series · 1 of 1 positions shown · non-contrast
Comparison: Earlier film, same date.

CLINICAL DATA: Chest tube placement.

EXAM:
PORTABLE CHEST 1 VIEW

[chest ap]
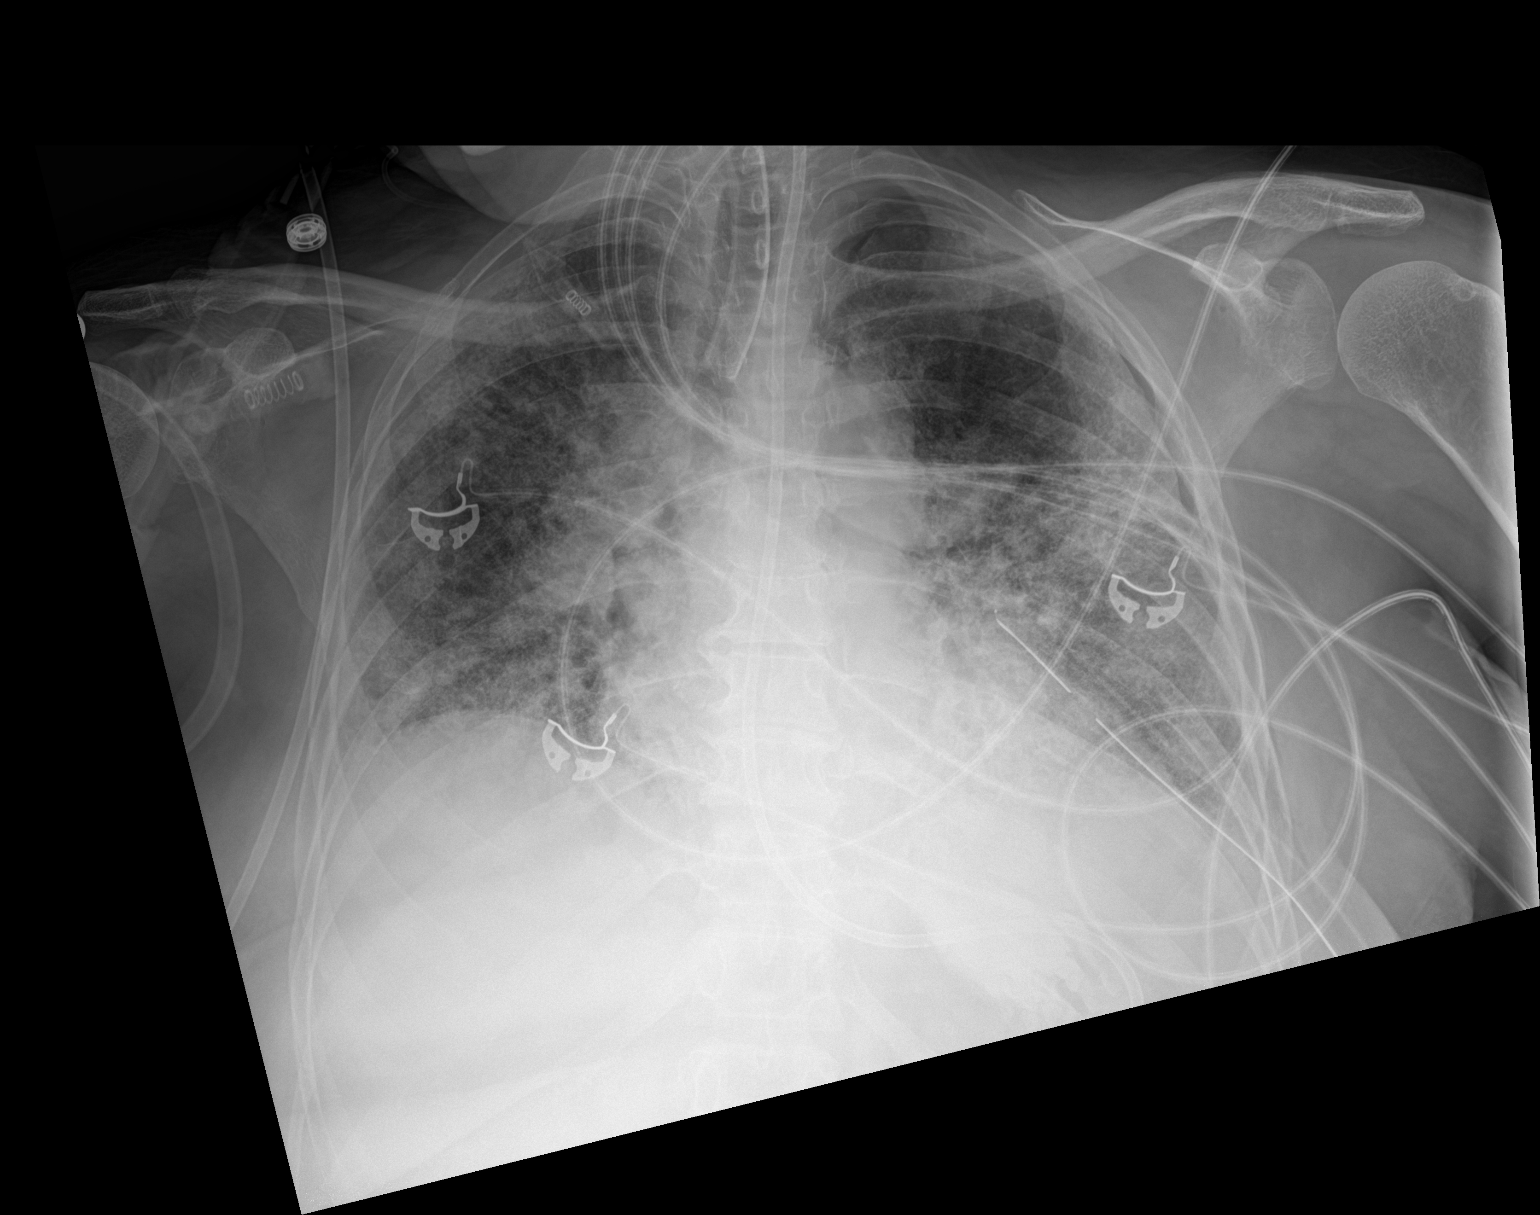

[1 of 1 positions shown; findings below may reference images not displayed]

FINDINGS: The endotracheal tube is 2.6 cm above the carina.

The feeding tube is coursing down the esophagus and into the
stomach.

New left-sided chest tube in good position. Persistent small
left-sided pneumothorax.

Persistent diffuse interstitial and airspace process.
IMPRESSION: 1. Interval placement of a left-sided chest tube. Stable small
pneumothorax.
2. Stable ET tube and feeding tube.
3. Persistent diffuse but asymmetric interstitial and airspace
process.

## 2020-06-25 IMAGING — DX DG CHEST 1V PORT
1 series · 1 of 1 positions shown · non-contrast
Comparison: 12/12/2018

CLINICAL DATA: ET tube

EXAM:
PORTABLE CHEST 1 VIEW

[chest ap]
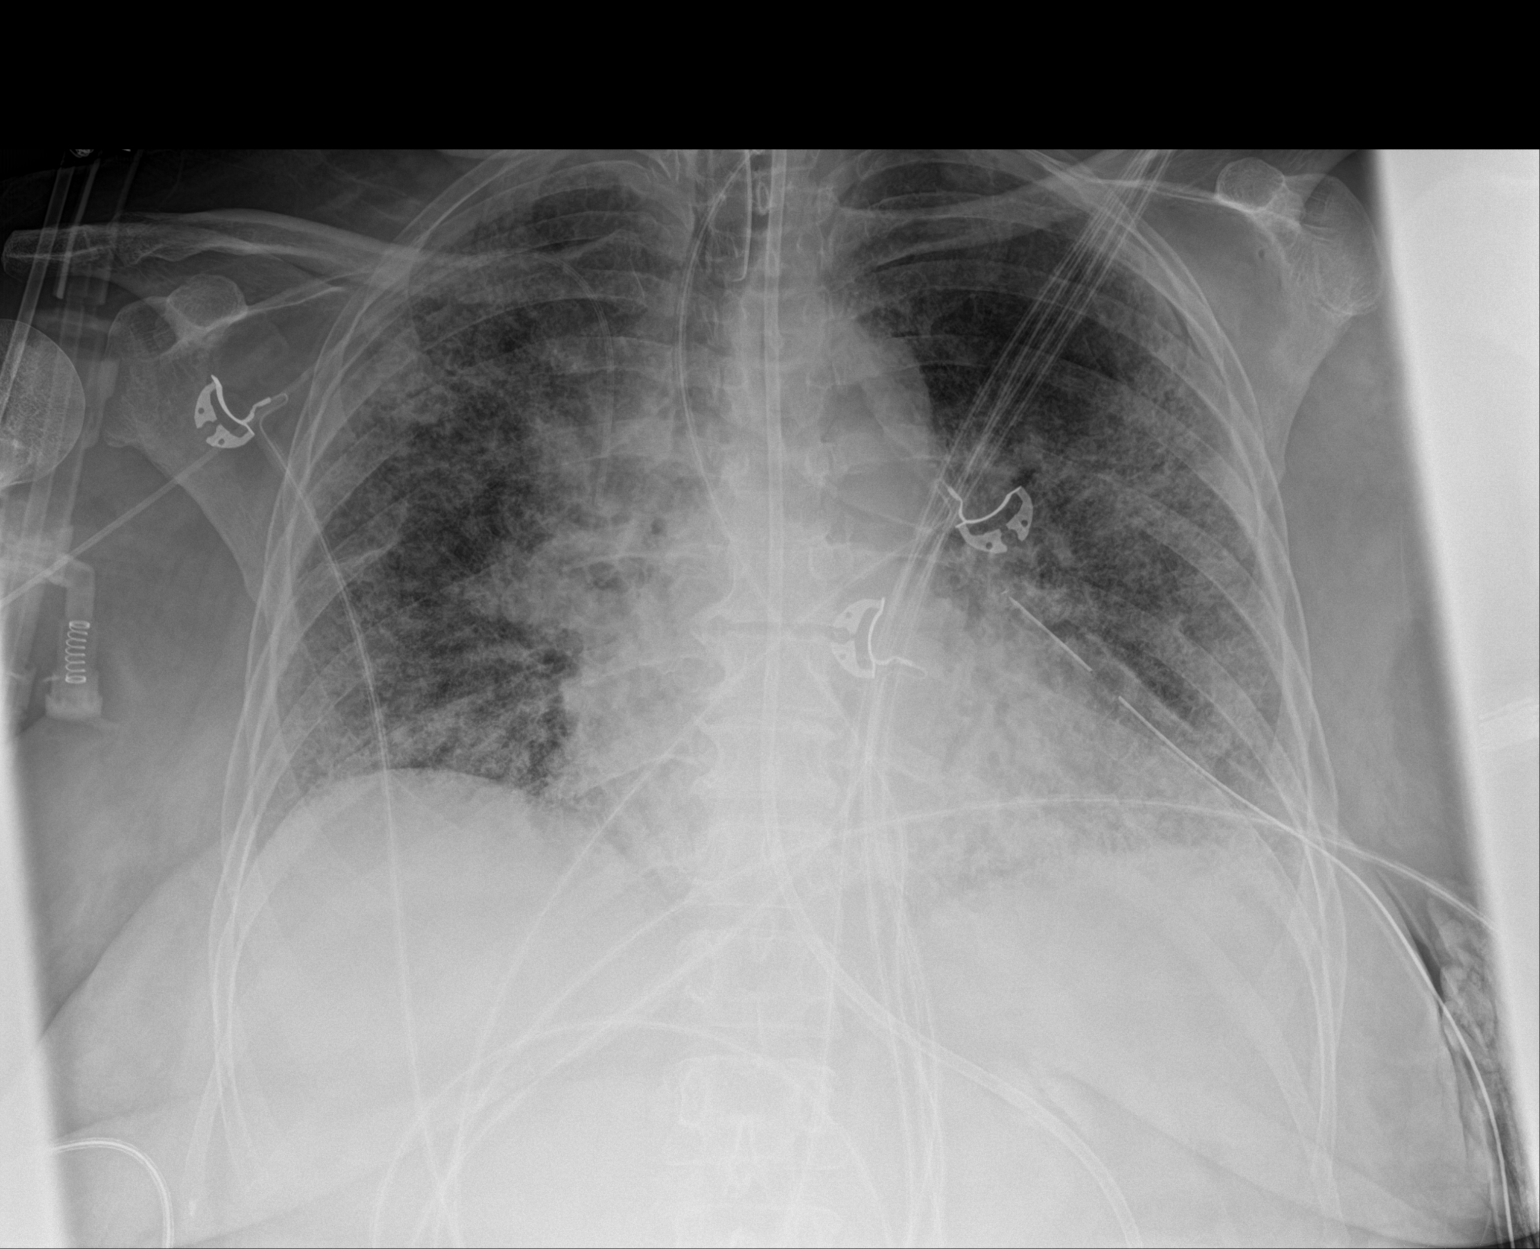

[1 of 1 positions shown; findings below may reference images not displayed]

FINDINGS: Support devices are stable. Left chest tube in place. No
pneumothorax. Diffuse bilateral airspace disease similar to prior
study. No effusions or pneumothorax. No acute bony abnormality.
IMPRESSION: Stable support devices.  No pneumothorax.

Diffuse bilateral airspace disease, unchanged.
